# Patient Record
Sex: Female | Born: 1955 | Race: Black or African American | Hispanic: No | Smoking: Never smoker
Health system: Southern US, Community
[De-identification: ages and names within clinical notes are randomized; demographics above are authoritative.]

## PROBLEM LIST (undated history)

## (undated) DIAGNOSIS — B20 Human immunodeficiency virus [HIV] disease: Secondary | ICD-10-CM

## (undated) DIAGNOSIS — H269 Unspecified cataract: Secondary | ICD-10-CM

## (undated) DIAGNOSIS — Z21 Asymptomatic human immunodeficiency virus [HIV] infection status: Secondary | ICD-10-CM

## (undated) DIAGNOSIS — E039 Hypothyroidism, unspecified: Secondary | ICD-10-CM

## (undated) DIAGNOSIS — I10 Essential (primary) hypertension: Secondary | ICD-10-CM

## (undated) DIAGNOSIS — M1990 Unspecified osteoarthritis, unspecified site: Secondary | ICD-10-CM

## (undated) DIAGNOSIS — J329 Chronic sinusitis, unspecified: Secondary | ICD-10-CM

## (undated) DIAGNOSIS — R0600 Dyspnea, unspecified: Secondary | ICD-10-CM

## (undated) DIAGNOSIS — E079 Disorder of thyroid, unspecified: Secondary | ICD-10-CM

## (undated) DIAGNOSIS — F419 Anxiety disorder, unspecified: Secondary | ICD-10-CM

## (undated) DIAGNOSIS — J189 Pneumonia, unspecified organism: Secondary | ICD-10-CM

## (undated) HISTORY — DX: Unspecified cataract: H26.9

## (undated) HISTORY — DX: Unspecified osteoarthritis, unspecified site: M19.90

## (undated) HISTORY — PX: TOTAL HIP ARTHROPLASTY: SHX124

## (undated) HISTORY — PX: OTHER SURGICAL HISTORY: SHX169

## (undated) HISTORY — DX: Anxiety disorder, unspecified: F41.9

## (undated) HISTORY — DX: Asymptomatic human immunodeficiency virus (hiv) infection status: Z21

## (undated) HISTORY — DX: Human immunodeficiency virus (HIV) disease: B20

---

## 1999-03-03 ENCOUNTER — Other Ambulatory Visit: Admission: RE | Admit: 1999-03-03 | Discharge: 1999-03-03 | Payer: Self-pay | Admitting: *Deleted

## 1999-06-15 ENCOUNTER — Encounter: Payer: Self-pay | Admitting: Orthopedic Surgery

## 1999-06-22 ENCOUNTER — Inpatient Hospital Stay (HOSPITAL_COMMUNITY): Admission: RE | Admit: 1999-06-22 | Discharge: 1999-06-24 | Payer: Self-pay | Admitting: Orthopedic Surgery

## 1999-06-24 ENCOUNTER — Inpatient Hospital Stay (HOSPITAL_COMMUNITY)
Admission: RE | Admit: 1999-06-24 | Discharge: 1999-07-01 | Payer: Self-pay | Admitting: Physical Medicine & Rehabilitation

## 1999-08-08 ENCOUNTER — Encounter: Admission: RE | Admit: 1999-08-08 | Discharge: 1999-11-06 | Payer: Self-pay | Admitting: Orthopedic Surgery

## 2000-03-22 ENCOUNTER — Ambulatory Visit (HOSPITAL_COMMUNITY): Admission: RE | Admit: 2000-03-22 | Discharge: 2000-03-22 | Payer: Self-pay | Admitting: Orthopedic Surgery

## 2000-03-22 ENCOUNTER — Encounter: Payer: Self-pay | Admitting: Orthopedic Surgery

## 2000-04-04 ENCOUNTER — Ambulatory Visit (HOSPITAL_COMMUNITY): Admission: RE | Admit: 2000-04-04 | Discharge: 2000-04-04 | Payer: Self-pay | Admitting: Orthopedic Surgery

## 2000-04-04 ENCOUNTER — Encounter: Payer: Self-pay | Admitting: Orthopedic Surgery

## 2002-08-14 ENCOUNTER — Other Ambulatory Visit: Admission: RE | Admit: 2002-08-14 | Discharge: 2002-08-14 | Payer: Self-pay | Admitting: Nephrology

## 2003-03-17 ENCOUNTER — Encounter: Admission: RE | Admit: 2003-03-17 | Discharge: 2003-03-17 | Payer: Self-pay | Admitting: Nephrology

## 2003-07-02 ENCOUNTER — Other Ambulatory Visit: Admission: RE | Admit: 2003-07-02 | Discharge: 2003-07-02 | Payer: Self-pay | Admitting: Obstetrics and Gynecology

## 2005-08-14 ENCOUNTER — Emergency Department (HOSPITAL_COMMUNITY): Admission: EM | Admit: 2005-08-14 | Discharge: 2005-08-14 | Payer: Self-pay | Admitting: Family Medicine

## 2006-01-05 ENCOUNTER — Emergency Department (HOSPITAL_COMMUNITY): Admission: EM | Admit: 2006-01-05 | Discharge: 2006-01-06 | Payer: Self-pay | Admitting: Emergency Medicine

## 2006-09-28 ENCOUNTER — Ambulatory Visit: Payer: Self-pay | Admitting: Nurse Practitioner

## 2006-09-28 ENCOUNTER — Ambulatory Visit: Payer: Self-pay | Admitting: *Deleted

## 2006-09-28 DIAGNOSIS — B009 Herpesviral infection, unspecified: Secondary | ICD-10-CM | POA: Insufficient documentation

## 2006-09-28 DIAGNOSIS — E039 Hypothyroidism, unspecified: Secondary | ICD-10-CM | POA: Insufficient documentation

## 2006-09-28 LAB — CONVERTED CEMR LAB
Glucose, Urine, Semiquant: NEGATIVE
Ketones, urine, test strip: NEGATIVE
Nitrite: NEGATIVE
Specific Gravity, Urine: >=1.03
pH: 5.5

## 2006-10-02 ENCOUNTER — Encounter (INDEPENDENT_AMBULATORY_CARE_PROVIDER_SITE_OTHER): Payer: Self-pay | Admitting: Nurse Practitioner

## 2006-10-05 ENCOUNTER — Ambulatory Visit: Payer: Self-pay | Admitting: Nurse Practitioner

## 2006-10-05 DIAGNOSIS — M25579 Pain in unspecified ankle and joints of unspecified foot: Secondary | ICD-10-CM

## 2006-10-05 DIAGNOSIS — E669 Obesity, unspecified: Secondary | ICD-10-CM

## 2006-10-05 LAB — CONVERTED CEMR LAB
AST: 11 units/L (ref 0–37)
Albumin: 4 g/dL (ref 3.5–5.2)
Alkaline Phosphatase: 96 units/L (ref 39–117)
BUN: 16 mg/dL (ref 6–23)
Basophils Relative: 0 % (ref 0–1)
Calcium: 9.3 mg/dL (ref 8.4–10.5)
Chloride: 106 meq/L (ref 96–112)
Eosinophils Absolute: 0.1 10*3/uL (ref 0.0–0.7)
Glucose, Bld: 82 mg/dL (ref 70–99)
Lymphs Abs: 2.7 10*3/uL (ref 0.7–3.3)
MCV: 84.4 fL (ref 78.0–100.0)
Monocytes Relative: 7 % (ref 3–11)
Neutro Abs: 3.2 10*3/uL (ref 1.7–7.7)
Neutrophils Relative %: 49 % (ref 43–77)
Platelets: 470 10*3/uL — ABNORMAL HIGH (ref 150–400)
Potassium: 4.5 meq/L (ref 3.5–5.3)
RBC: 4.22 M/uL (ref 3.87–5.11)
Sodium: 139 meq/L (ref 135–145)
Total Protein: 7.5 g/dL (ref 6.0–8.3)
WBC: 6.5 10*3/uL (ref 4.0–10.5)

## 2006-10-08 ENCOUNTER — Encounter (INDEPENDENT_AMBULATORY_CARE_PROVIDER_SITE_OTHER): Payer: Self-pay | Admitting: Nurse Practitioner

## 2006-10-08 DIAGNOSIS — D649 Anemia, unspecified: Secondary | ICD-10-CM | POA: Insufficient documentation

## 2006-11-14 ENCOUNTER — Emergency Department (HOSPITAL_COMMUNITY): Admission: EM | Admit: 2006-11-14 | Discharge: 2006-11-15 | Payer: Self-pay | Admitting: Emergency Medicine

## 2006-11-14 LAB — CONVERTED CEMR LAB
BUN: 22 mg/dL
Basophils Absolute: 0 10*3/uL
Basophils Relative: 1 %
Chloride: 106 meq/L
Eosinophils Absolute: 0.2 10*3/uL
MCHC: 32.2 g/dL
MCV: 80 fL
Neutro Abs: 2.5 10*3/uL
Neutrophils Relative %: 36 %
Platelets: 360 10*3/uL
Potassium: 4 meq/L
RDW: 16 %
Sodium: 137 meq/L

## 2006-12-18 ENCOUNTER — Ambulatory Visit: Payer: Self-pay | Admitting: Nurse Practitioner

## 2006-12-18 DIAGNOSIS — K219 Gastro-esophageal reflux disease without esophagitis: Secondary | ICD-10-CM

## 2006-12-18 DIAGNOSIS — R0789 Other chest pain: Secondary | ICD-10-CM

## 2007-01-02 ENCOUNTER — Ambulatory Visit: Payer: Self-pay | Admitting: Cardiology

## 2007-01-10 ENCOUNTER — Ambulatory Visit: Payer: Self-pay

## 2007-01-10 ENCOUNTER — Encounter: Payer: Self-pay | Admitting: Cardiology

## 2007-02-24 ENCOUNTER — Emergency Department (HOSPITAL_COMMUNITY): Admission: EM | Admit: 2007-02-24 | Discharge: 2007-02-25 | Payer: Self-pay | Admitting: Emergency Medicine

## 2007-02-25 ENCOUNTER — Encounter (INDEPENDENT_AMBULATORY_CARE_PROVIDER_SITE_OTHER): Payer: Self-pay | Admitting: Nurse Practitioner

## 2007-02-27 ENCOUNTER — Encounter (INDEPENDENT_AMBULATORY_CARE_PROVIDER_SITE_OTHER): Payer: Self-pay | Admitting: Nurse Practitioner

## 2007-02-27 ENCOUNTER — Ambulatory Visit: Payer: Self-pay | Admitting: Family Medicine

## 2007-06-21 ENCOUNTER — Ambulatory Visit: Payer: Self-pay | Admitting: Nurse Practitioner

## 2007-06-21 DIAGNOSIS — R5381 Other malaise: Secondary | ICD-10-CM | POA: Insufficient documentation

## 2007-06-21 DIAGNOSIS — M25569 Pain in unspecified knee: Secondary | ICD-10-CM | POA: Insufficient documentation

## 2007-06-21 DIAGNOSIS — R5383 Other fatigue: Secondary | ICD-10-CM

## 2007-06-24 ENCOUNTER — Ambulatory Visit (HOSPITAL_COMMUNITY): Admission: RE | Admit: 2007-06-24 | Discharge: 2007-06-24 | Payer: Self-pay | Admitting: Nurse Practitioner

## 2007-06-25 LAB — CONVERTED CEMR LAB
AST: 15 units/L (ref 0–37)
BUN: 15 mg/dL (ref 6–23)
Basophils Relative: 0 % (ref 0–1)
Calcium: 9.1 mg/dL (ref 8.4–10.5)
Chloride: 105 meq/L (ref 96–112)
Creatinine, Ser: 0.86 mg/dL (ref 0.40–1.20)
Eosinophils Relative: 1 % (ref 0–5)
HCT: 34 % — ABNORMAL LOW (ref 36.0–46.0)
Hemoglobin: 10.5 g/dL — ABNORMAL LOW (ref 12.0–15.0)
MCHC: 30.9 g/dL (ref 30.0–36.0)
MCV: 81.3 fL (ref 78.0–100.0)
Monocytes Absolute: 0.6 10*3/uL (ref 0.1–1.0)
Monocytes Relative: 10 % (ref 3–12)
Neutro Abs: 2 10*3/uL (ref 1.7–7.7)
RBC: 4.18 M/uL (ref 3.87–5.11)
TSH: 8.293 microintl units/mL — ABNORMAL HIGH (ref 0.350–5.50)
Total Bilirubin: 0.2 mg/dL — ABNORMAL LOW (ref 0.3–1.2)

## 2007-08-02 ENCOUNTER — Telehealth (INDEPENDENT_AMBULATORY_CARE_PROVIDER_SITE_OTHER): Payer: Self-pay | Admitting: Nurse Practitioner

## 2007-08-13 ENCOUNTER — Emergency Department (HOSPITAL_COMMUNITY): Admission: EM | Admit: 2007-08-13 | Discharge: 2007-08-13 | Payer: Self-pay | Admitting: Emergency Medicine

## 2007-08-23 ENCOUNTER — Ambulatory Visit: Payer: Self-pay | Admitting: Internal Medicine

## 2007-08-23 ENCOUNTER — Ambulatory Visit (HOSPITAL_COMMUNITY): Admission: RE | Admit: 2007-08-23 | Discharge: 2007-08-23 | Payer: Self-pay | Admitting: Internal Medicine

## 2007-08-24 DIAGNOSIS — M169 Osteoarthritis of hip, unspecified: Secondary | ICD-10-CM

## 2007-08-26 ENCOUNTER — Telehealth (INDEPENDENT_AMBULATORY_CARE_PROVIDER_SITE_OTHER): Payer: Self-pay | Admitting: Nurse Practitioner

## 2007-08-27 ENCOUNTER — Emergency Department (HOSPITAL_COMMUNITY): Admission: EM | Admit: 2007-08-27 | Discharge: 2007-08-28 | Payer: Self-pay | Admitting: *Deleted

## 2007-08-29 ENCOUNTER — Ambulatory Visit: Payer: Self-pay | Admitting: Nurse Practitioner

## 2007-09-02 ENCOUNTER — Telehealth (INDEPENDENT_AMBULATORY_CARE_PROVIDER_SITE_OTHER): Payer: Self-pay | Admitting: Nurse Practitioner

## 2007-09-02 DIAGNOSIS — M8430XA Stress fracture, unspecified site, initial encounter for fracture: Secondary | ICD-10-CM

## 2007-09-05 ENCOUNTER — Encounter: Admission: RE | Admit: 2007-09-05 | Discharge: 2007-09-05 | Payer: Self-pay | Admitting: Internal Medicine

## 2007-09-06 ENCOUNTER — Telehealth (INDEPENDENT_AMBULATORY_CARE_PROVIDER_SITE_OTHER): Payer: Self-pay | Admitting: Nurse Practitioner

## 2007-09-16 ENCOUNTER — Telehealth (INDEPENDENT_AMBULATORY_CARE_PROVIDER_SITE_OTHER): Payer: Self-pay | Admitting: Nurse Practitioner

## 2007-10-02 ENCOUNTER — Telehealth (INDEPENDENT_AMBULATORY_CARE_PROVIDER_SITE_OTHER): Payer: Self-pay | Admitting: *Deleted

## 2008-01-15 ENCOUNTER — Telehealth (INDEPENDENT_AMBULATORY_CARE_PROVIDER_SITE_OTHER): Payer: Self-pay | Admitting: Nurse Practitioner

## 2008-01-22 ENCOUNTER — Ambulatory Visit: Payer: Self-pay | Admitting: Nurse Practitioner

## 2008-01-22 DIAGNOSIS — L259 Unspecified contact dermatitis, unspecified cause: Secondary | ICD-10-CM

## 2008-01-27 LAB — CONVERTED CEMR LAB
Albumin: 4 g/dL (ref 3.5–5.2)
BUN: 13 mg/dL (ref 6–23)
CO2: 23 meq/L (ref 19–32)
Calcium: 9.7 mg/dL (ref 8.4–10.5)
Chloride: 102 meq/L (ref 96–112)
Glucose, Bld: 87 mg/dL (ref 70–99)
HDL: 51 mg/dL (ref >39)
Lymphs Abs: 4.1 10*3/uL — ABNORMAL HIGH (ref 0.7–4.0)
MCV: 80.6 fL (ref 78.0–100.0)
Monocytes Relative: 8 % (ref 3–12)
Neutro Abs: 2.9 10*3/uL (ref 1.7–7.7)
Neutrophils Relative %: 37 % — ABNORMAL LOW (ref 43–77)
Potassium: 4.3 meq/L (ref 3.5–5.3)
RBC: 4.44 M/uL (ref 3.87–5.11)
Triglycerides: 94 mg/dL (ref <150)
WBC: 7.8 10*3/uL (ref 4.0–10.5)

## 2008-04-27 ENCOUNTER — Telehealth (INDEPENDENT_AMBULATORY_CARE_PROVIDER_SITE_OTHER): Payer: Self-pay | Admitting: Nurse Practitioner

## 2008-07-01 ENCOUNTER — Ambulatory Visit: Payer: Self-pay | Admitting: Nurse Practitioner

## 2008-07-01 DIAGNOSIS — K029 Dental caries, unspecified: Secondary | ICD-10-CM | POA: Insufficient documentation

## 2008-09-16 ENCOUNTER — Telehealth (INDEPENDENT_AMBULATORY_CARE_PROVIDER_SITE_OTHER): Payer: Self-pay | Admitting: Nurse Practitioner

## 2008-09-16 ENCOUNTER — Ambulatory Visit: Payer: Self-pay | Admitting: Nurse Practitioner

## 2008-09-16 DIAGNOSIS — R030 Elevated blood-pressure reading, without diagnosis of hypertension: Secondary | ICD-10-CM | POA: Insufficient documentation

## 2008-10-29 ENCOUNTER — Telehealth (INDEPENDENT_AMBULATORY_CARE_PROVIDER_SITE_OTHER): Payer: Self-pay | Admitting: Nurse Practitioner

## 2008-11-19 ENCOUNTER — Ambulatory Visit: Payer: Self-pay | Admitting: Nurse Practitioner

## 2008-11-19 DIAGNOSIS — I493 Ventricular premature depolarization: Secondary | ICD-10-CM | POA: Insufficient documentation

## 2008-11-19 LAB — CONVERTED CEMR LAB
Glucose, Urine, Semiquant: NEGATIVE
Nitrite: NEGATIVE
OCCULT 1: NEGATIVE
Protein, U semiquant: NEGATIVE
WBC Urine, dipstick: NEGATIVE
pH: 5

## 2008-11-26 LAB — CONVERTED CEMR LAB: OCCULT 1: NEGATIVE

## 2008-12-01 ENCOUNTER — Encounter (INDEPENDENT_AMBULATORY_CARE_PROVIDER_SITE_OTHER): Payer: Self-pay | Admitting: Nurse Practitioner

## 2008-12-01 DIAGNOSIS — B20 Human immunodeficiency virus [HIV] disease: Secondary | ICD-10-CM

## 2008-12-01 LAB — CONVERTED CEMR LAB
ALT: 13 units/L (ref 0–35)
AST: 14 units/L (ref 0–37)
Alkaline Phosphatase: 90 units/L (ref 39–117)
Basophils Absolute: 0 10*3/uL (ref 0.0–0.1)
Basophils Relative: 0 % (ref 0–1)
Chloride: 104 meq/L (ref 96–112)
Creatinine, Ser: 0.99 mg/dL (ref 0.40–1.20)
Eosinophils Absolute: 0.2 10*3/uL (ref 0.0–0.7)
HIV 1 RNA Quant: 1160 copies/mL — ABNORMAL HIGH (ref <48)
HIV-1 RNA Quant, Log: 3.06 — ABNORMAL HIGH (ref <1.68)
HIV-2 Ab: UNDETERMINED — AB
Lymphs Abs: 3.5 10*3/uL (ref 0.7–4.0)
MCV: 82.7 fL (ref 78.0–100.0)
Neutrophils Relative %: 37 % — ABNORMAL LOW (ref 43–77)
Platelets: 393 10*3/uL (ref 150–400)
RDW: 16.2 % — ABNORMAL HIGH (ref 11.5–15.5)
Total Bilirubin: 0.2 mg/dL — ABNORMAL LOW (ref 0.3–1.2)
Total lymphocyte count: 2989 cells/mcL (ref 700–3300)
WBC: 7.2 10*3/uL (ref 4.0–10.5)

## 2008-12-02 ENCOUNTER — Encounter (INDEPENDENT_AMBULATORY_CARE_PROVIDER_SITE_OTHER): Payer: Self-pay | Admitting: Nurse Practitioner

## 2008-12-03 ENCOUNTER — Encounter (INDEPENDENT_AMBULATORY_CARE_PROVIDER_SITE_OTHER): Payer: Self-pay | Admitting: Nurse Practitioner

## 2008-12-08 ENCOUNTER — Ambulatory Visit: Payer: Self-pay | Admitting: Internal Medicine

## 2008-12-08 ENCOUNTER — Encounter (INDEPENDENT_AMBULATORY_CARE_PROVIDER_SITE_OTHER): Payer: Self-pay | Admitting: *Deleted

## 2008-12-09 ENCOUNTER — Ambulatory Visit (HOSPITAL_COMMUNITY): Admission: RE | Admit: 2008-12-09 | Discharge: 2008-12-09 | Payer: Self-pay | Admitting: Internal Medicine

## 2008-12-10 ENCOUNTER — Encounter: Payer: Self-pay | Admitting: Internal Medicine

## 2008-12-10 ENCOUNTER — Ambulatory Visit: Payer: Self-pay | Admitting: Internal Medicine

## 2009-01-13 ENCOUNTER — Telehealth (INDEPENDENT_AMBULATORY_CARE_PROVIDER_SITE_OTHER): Payer: Self-pay | Admitting: Nurse Practitioner

## 2009-01-19 ENCOUNTER — Encounter: Payer: Self-pay | Admitting: Internal Medicine

## 2009-01-25 ENCOUNTER — Encounter: Payer: Self-pay | Admitting: Internal Medicine

## 2009-01-25 ENCOUNTER — Ambulatory Visit: Payer: Self-pay | Admitting: Internal Medicine

## 2009-03-04 ENCOUNTER — Telehealth (INDEPENDENT_AMBULATORY_CARE_PROVIDER_SITE_OTHER): Payer: Self-pay | Admitting: Nurse Practitioner

## 2009-03-04 ENCOUNTER — Telehealth: Payer: Self-pay | Admitting: Internal Medicine

## 2009-03-11 ENCOUNTER — Encounter (INDEPENDENT_AMBULATORY_CARE_PROVIDER_SITE_OTHER): Payer: Self-pay | Admitting: *Deleted

## 2009-03-18 ENCOUNTER — Ambulatory Visit: Payer: Self-pay | Admitting: Family Medicine

## 2009-03-18 ENCOUNTER — Encounter: Payer: Self-pay | Admitting: Internal Medicine

## 2009-03-18 ENCOUNTER — Encounter (INDEPENDENT_AMBULATORY_CARE_PROVIDER_SITE_OTHER): Payer: Self-pay | Admitting: Nurse Practitioner

## 2009-03-18 LAB — CONVERTED CEMR LAB
ALT: 15 units/L (ref 0–35)
BUN: 20 mg/dL (ref 6–23)
Basophils Relative: 0 % (ref 0–1)
CD4 T Helper %: 32 % (ref 32–62)
CO2: 26 meq/L (ref 19–32)
Calcium: 9 mg/dL (ref 8.4–10.5)
Creatinine, Ser: 1 mg/dL (ref 0.40–1.20)
Eosinophils Absolute: 0.1 10*3/uL (ref 0.0–0.7)
Eosinophils Relative: 3 % (ref 0–5)
HCT: 33.7 % — ABNORMAL LOW (ref 36.0–46.0)
HCV Ab: NEGATIVE
HIV 1 RNA Quant: 1470 copies/mL — ABNORMAL HIGH (ref <48)
Hepatitis B Surface Ag: NEGATIVE
MCHC: 30.6 g/dL (ref 30.0–36.0)
MCV: 84.3 fL (ref 78.0–100.0)
Monocytes Relative: 8 % (ref 3–12)
Neutrophils Relative %: 38 % — ABNORMAL LOW (ref 43–77)
Platelets: 415 10*3/uL — ABNORMAL HIGH (ref 150–400)
Total Bilirubin: 0.3 mg/dL (ref 0.3–1.2)

## 2009-03-23 ENCOUNTER — Emergency Department (HOSPITAL_COMMUNITY): Admission: EM | Admit: 2009-03-23 | Discharge: 2009-03-23 | Payer: Self-pay | Admitting: Emergency Medicine

## 2009-03-25 ENCOUNTER — Encounter: Admission: RE | Admit: 2009-03-25 | Discharge: 2009-03-25 | Payer: Self-pay | Admitting: Orthopedic Surgery

## 2009-03-25 ENCOUNTER — Encounter: Payer: Self-pay | Admitting: Internal Medicine

## 2009-03-25 ENCOUNTER — Ambulatory Visit: Payer: Self-pay | Admitting: Internal Medicine

## 2009-04-29 ENCOUNTER — Encounter: Payer: Self-pay | Admitting: Internal Medicine

## 2009-05-19 ENCOUNTER — Ambulatory Visit: Payer: Self-pay | Admitting: Internal Medicine

## 2009-05-21 ENCOUNTER — Encounter: Payer: Self-pay | Admitting: Internal Medicine

## 2009-05-21 LAB — CONVERTED CEMR LAB: TSH: 3.621 microintl units/mL (ref 0.350–4.500)

## 2009-05-28 ENCOUNTER — Telehealth: Payer: Self-pay | Admitting: Internal Medicine

## 2009-06-02 HISTORY — PX: JOINT REPLACEMENT: SHX530

## 2009-06-21 ENCOUNTER — Encounter: Payer: Self-pay | Admitting: Internal Medicine

## 2009-06-23 ENCOUNTER — Encounter
Admission: RE | Admit: 2009-06-23 | Discharge: 2009-09-20 | Payer: Self-pay | Admitting: Physical Medicine and Rehabilitation

## 2009-06-24 ENCOUNTER — Encounter (INDEPENDENT_AMBULATORY_CARE_PROVIDER_SITE_OTHER): Payer: Self-pay | Admitting: Nurse Practitioner

## 2009-06-24 ENCOUNTER — Ambulatory Visit: Payer: Self-pay | Admitting: Internal Medicine

## 2009-06-24 ENCOUNTER — Encounter (INDEPENDENT_AMBULATORY_CARE_PROVIDER_SITE_OTHER): Payer: Self-pay | Admitting: *Deleted

## 2009-06-24 LAB — CONVERTED CEMR LAB
AST: 17 units/L (ref 0–37)
Albumin: 3.6 g/dL (ref 3.5–5.2)
BUN: 14 mg/dL (ref 6–23)
CO2: 24 meq/L (ref 19–32)
Calcium: 9.2 mg/dL (ref 8.4–10.5)
Chloride: 105 meq/L (ref 96–112)
Cholesterol: 162 mg/dL (ref 0–200)
Creatinine, Ser: 0.97 mg/dL (ref 0.40–1.20)
Eosinophils Absolute: 0.2 10*3/uL (ref 0.0–0.7)
Eosinophils Relative: 4 % (ref 0–5)
Glucose, Bld: 100 mg/dL — ABNORMAL HIGH (ref 70–99)
HCT: 33.1 % — ABNORMAL LOW (ref 36.0–46.0)
HDL: 31 mg/dL — ABNORMAL LOW (ref >39)
HIV 1 RNA Quant: 491 copies/mL — ABNORMAL HIGH (ref <48)
Hemoglobin: 10.3 g/dL — ABNORMAL LOW (ref 12.0–15.0)
Lymphocytes Relative: 55 % — ABNORMAL HIGH (ref 12–46)
Lymphs Abs: 2.7 10*3/uL (ref 0.7–4.0)
MCV: 82.8 fL (ref 78.0–100.0)
Monocytes Absolute: 0.6 10*3/uL (ref 0.1–1.0)
Monocytes Relative: 12 % (ref 3–12)
Platelets: 406 10*3/uL — ABNORMAL HIGH (ref 150–400)
Potassium: 3.9 meq/L (ref 3.5–5.3)
RBC: 4 M/uL (ref 3.87–5.11)
Total CHOL/HDL Ratio: 5.2
WBC: 4.9 10*3/uL (ref 4.0–10.5)

## 2009-06-29 ENCOUNTER — Encounter: Payer: Self-pay | Admitting: Internal Medicine

## 2009-06-30 ENCOUNTER — Telehealth: Payer: Self-pay | Admitting: Internal Medicine

## 2009-07-01 ENCOUNTER — Encounter: Payer: Self-pay | Admitting: Internal Medicine

## 2009-07-12 ENCOUNTER — Ambulatory Visit: Payer: Self-pay | Admitting: Internal Medicine

## 2009-07-19 ENCOUNTER — Ambulatory Visit: Payer: Self-pay | Admitting: Physical Medicine and Rehabilitation

## 2009-08-09 ENCOUNTER — Encounter: Payer: Self-pay | Admitting: Internal Medicine

## 2009-08-09 IMAGING — CR DG FEMUR 2+V*R*
3 series · 3 of 3 positions shown · non-contrast
Comparison: Knee radiographs 03/17/2003.

Right femur 2 views, 02/25/2007.
INDICATION: Right knee/hip pain.

[t femur with knee ap right (1 of 2)]
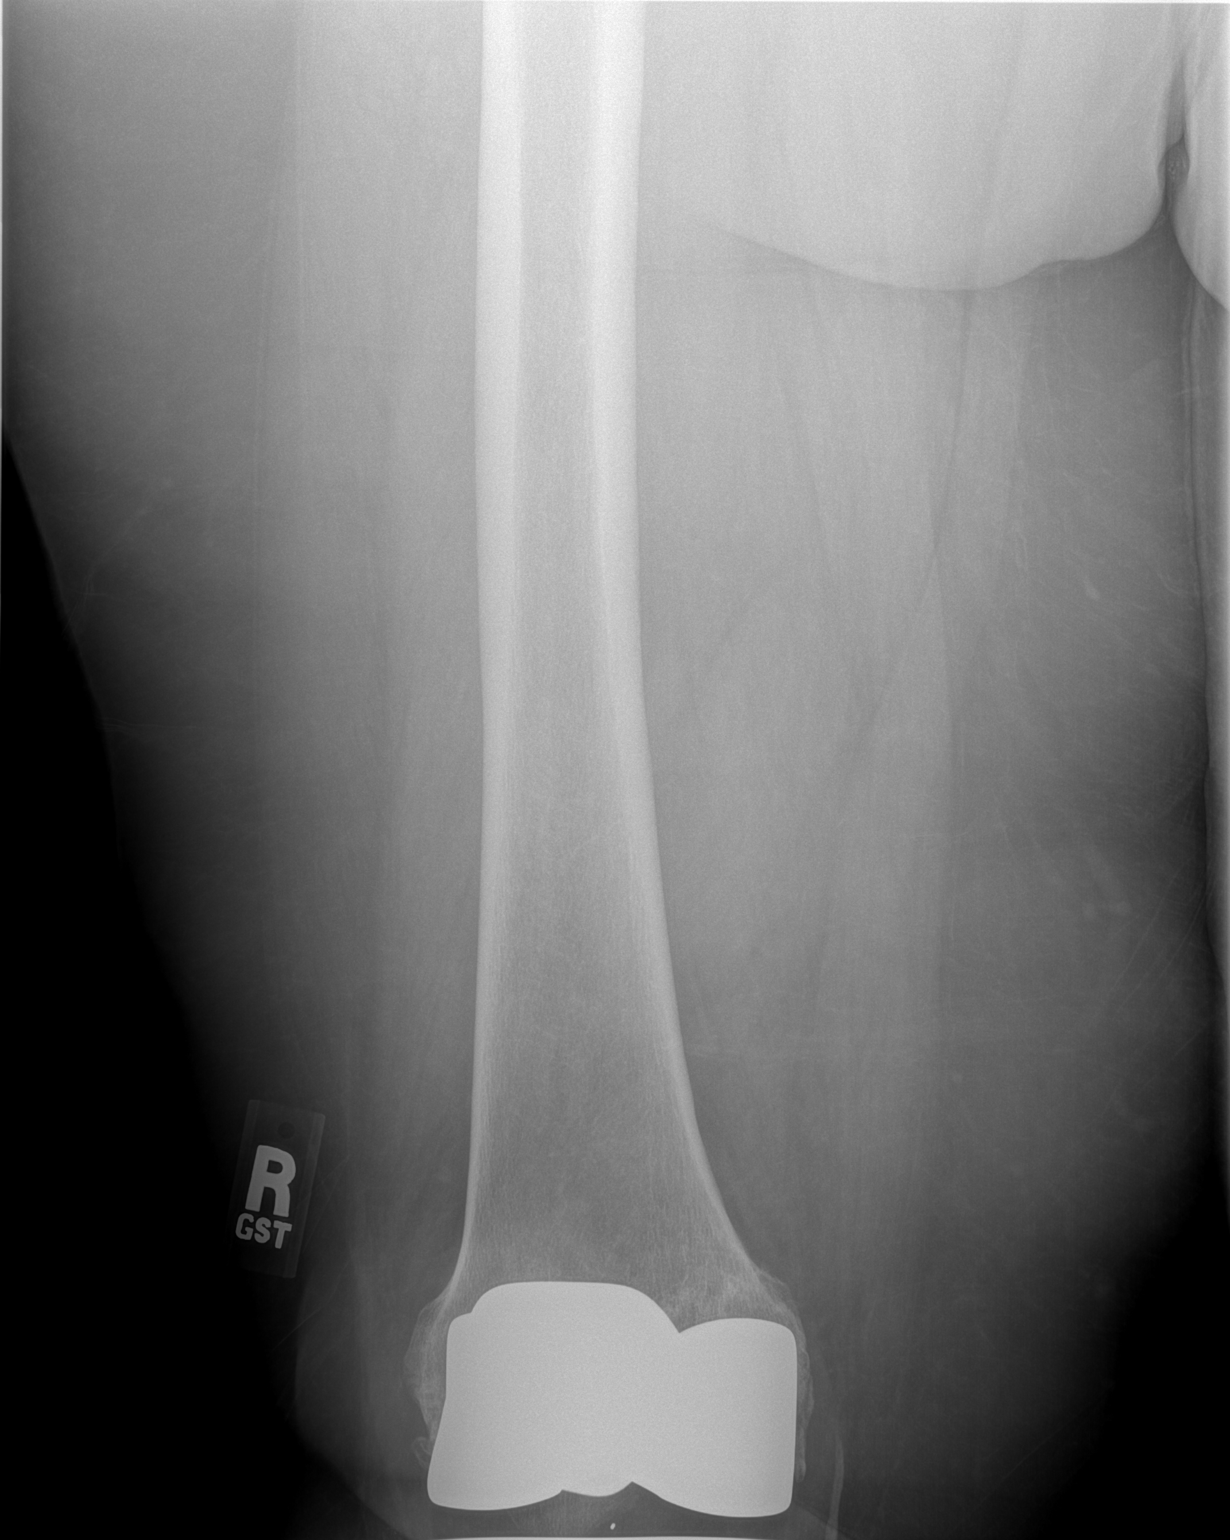

[t femur with knee ap right (2 of 2)]
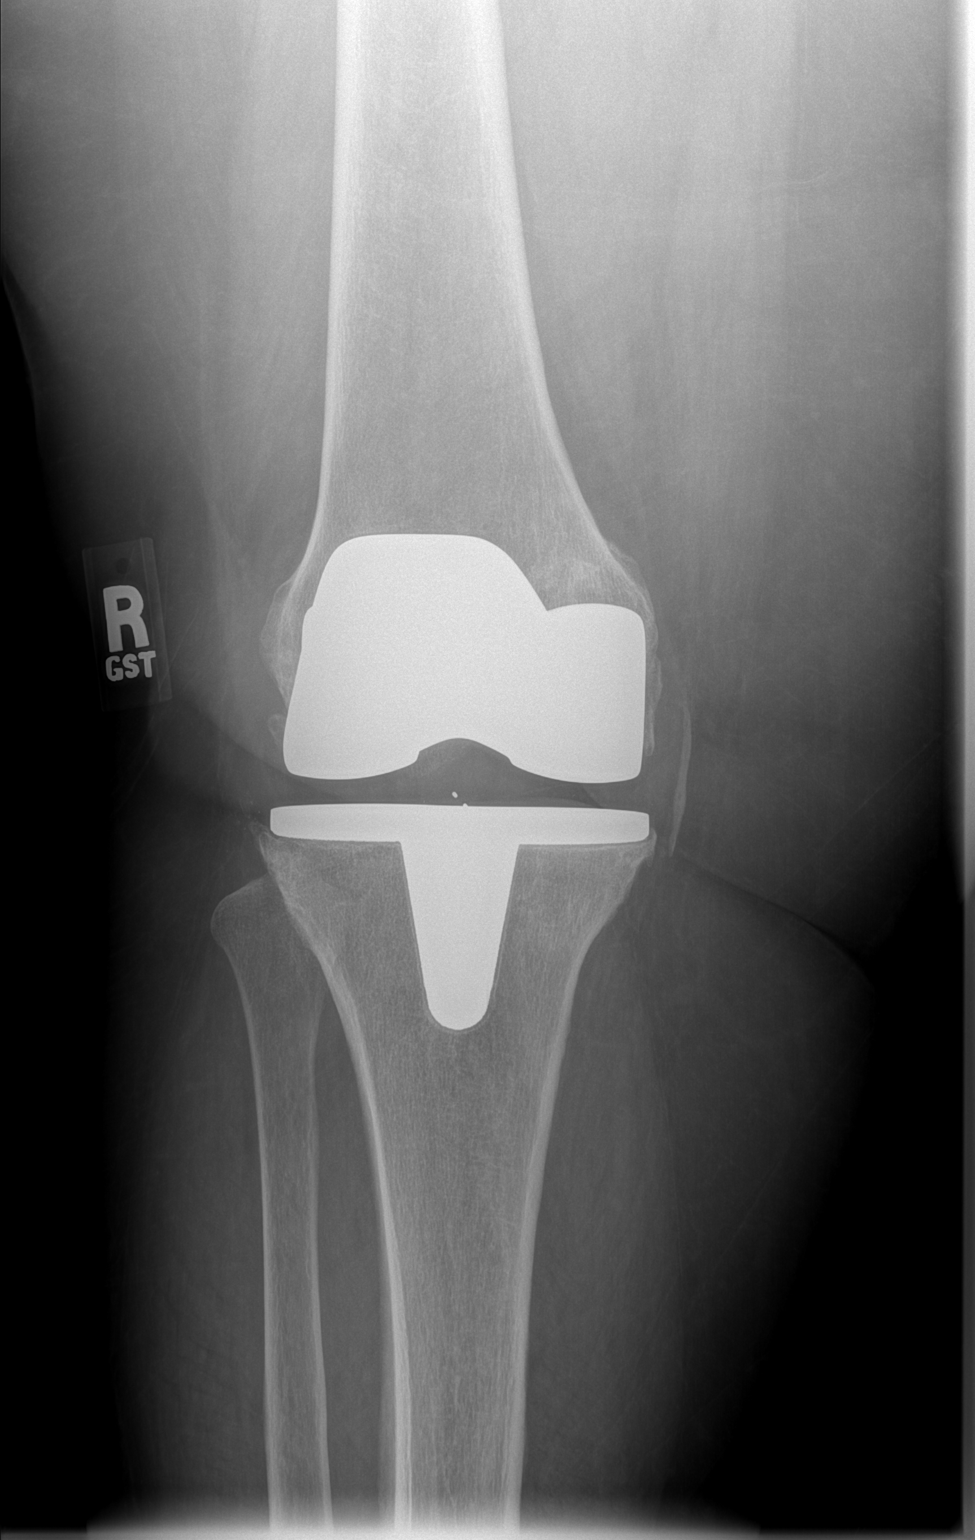

[t femur with knee lat right]
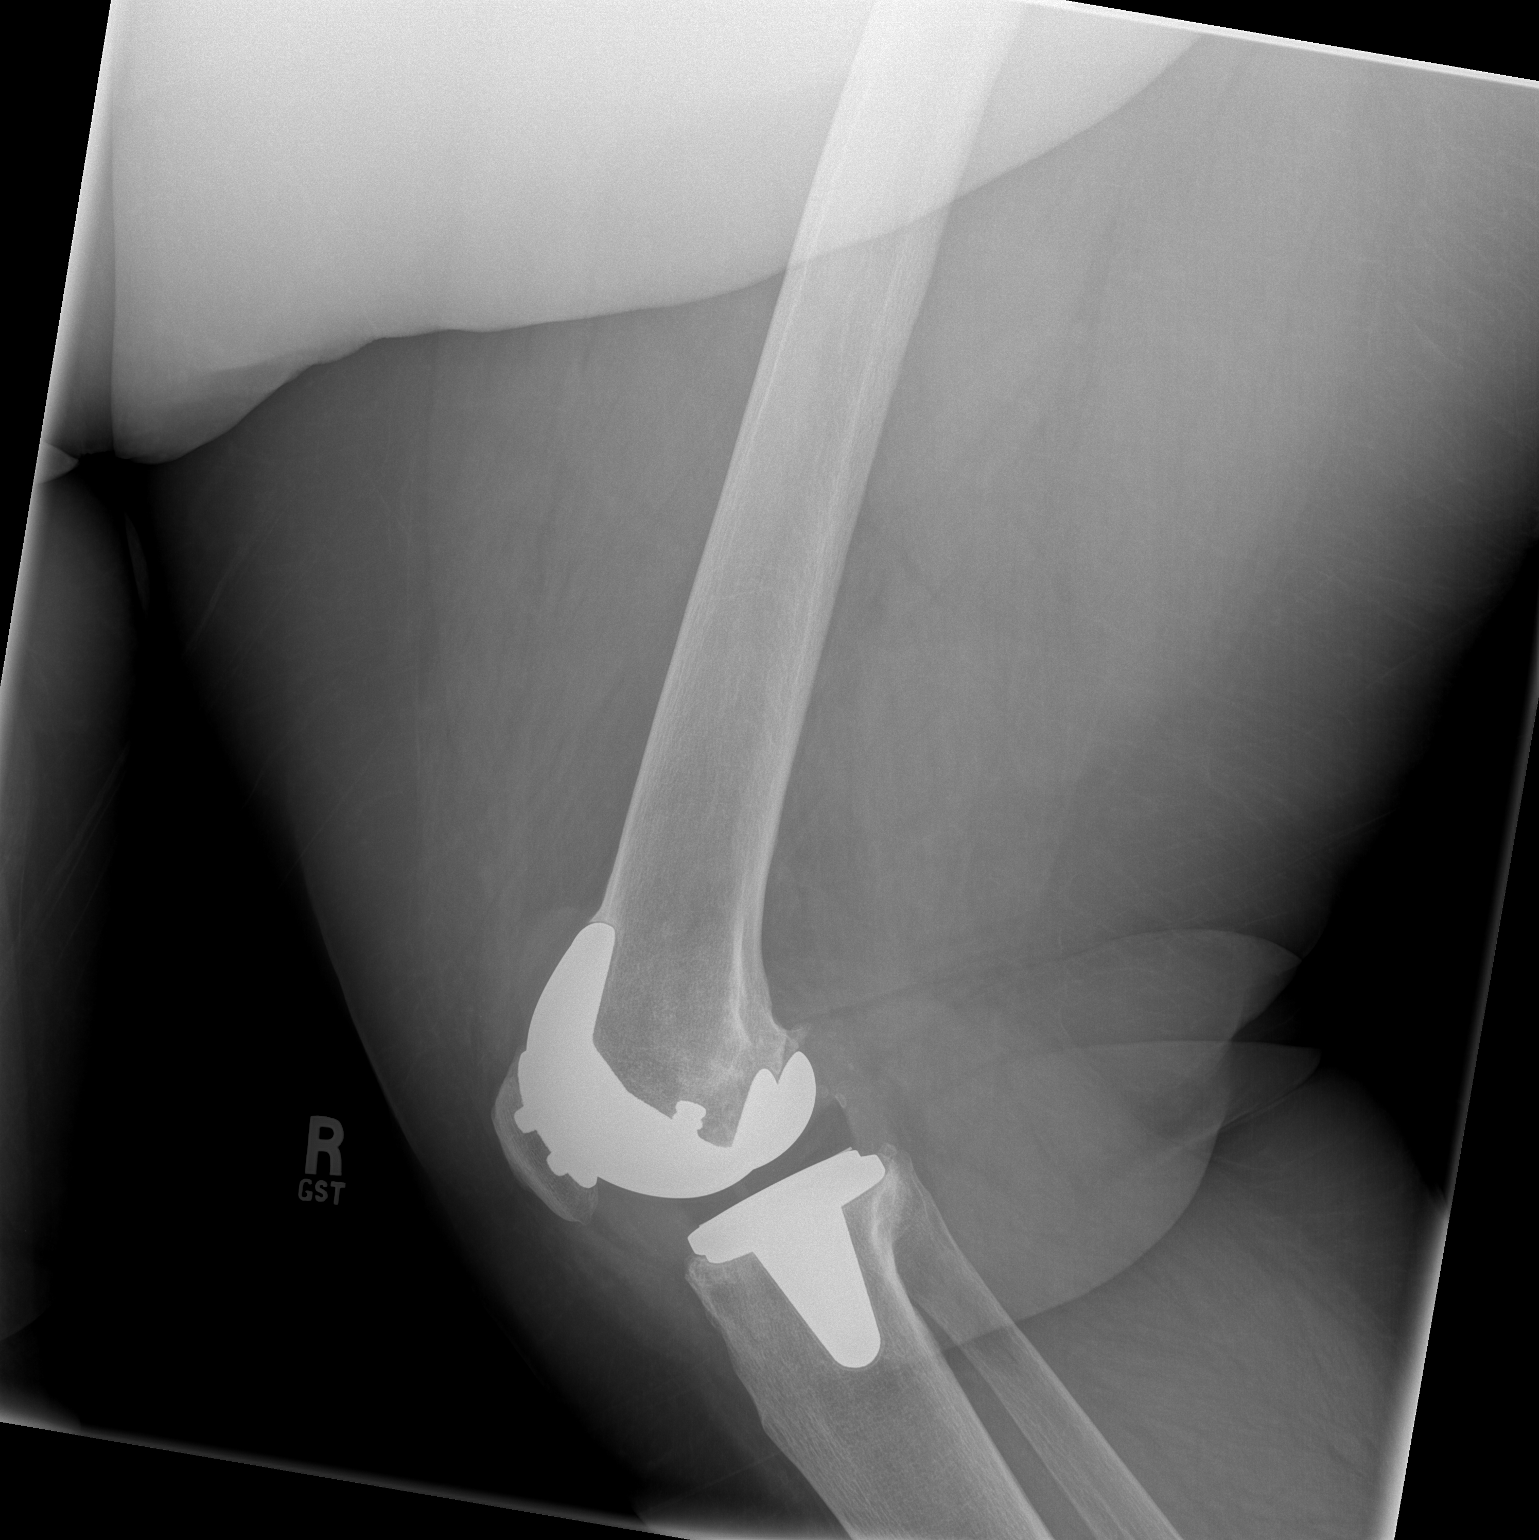

[3 of 3 positions shown; findings below may reference images not displayed]

FINDINGS: Right three-part total knee arthroplasty is present without
complicating features. Polyethylene spacer appears intact. No evidence of
loosening. Dystrophic calcification along the medial collateral ligament.
IMPRESSION: Intact distal right femur with uncomplicated three-part right total
knee arthroplasty.

## 2009-08-13 ENCOUNTER — Ambulatory Visit: Payer: Self-pay | Admitting: Physical Medicine and Rehabilitation

## 2009-08-16 ENCOUNTER — Encounter: Admission: RE | Admit: 2009-08-16 | Discharge: 2009-09-24 | Payer: Self-pay | Admitting: Orthopedic Surgery

## 2009-09-20 ENCOUNTER — Telehealth (INDEPENDENT_AMBULATORY_CARE_PROVIDER_SITE_OTHER): Payer: Self-pay | Admitting: *Deleted

## 2009-09-20 ENCOUNTER — Encounter
Admission: RE | Admit: 2009-09-20 | Discharge: 2009-12-13 | Payer: Self-pay | Source: Home / Self Care | Attending: Physical Medicine and Rehabilitation | Admitting: Physical Medicine and Rehabilitation

## 2009-09-24 ENCOUNTER — Ambulatory Visit: Payer: Self-pay | Admitting: Physical Medicine and Rehabilitation

## 2009-10-07 ENCOUNTER — Ambulatory Visit (HOSPITAL_COMMUNITY)
Admission: RE | Admit: 2009-10-07 | Discharge: 2009-10-07 | Payer: Self-pay | Admitting: Physical Medicine and Rehabilitation

## 2009-11-17 ENCOUNTER — Ambulatory Visit: Payer: Self-pay | Admitting: Physical Medicine and Rehabilitation

## 2009-12-13 ENCOUNTER — Ambulatory Visit: Payer: Self-pay | Admitting: Physical Medicine and Rehabilitation

## 2010-01-17 ENCOUNTER — Encounter
Admission: RE | Admit: 2010-01-17 | Discharge: 2010-02-01 | Payer: Self-pay | Source: Home / Self Care | Attending: Physical Medicine and Rehabilitation | Admitting: Physical Medicine and Rehabilitation

## 2010-01-18 ENCOUNTER — Ambulatory Visit
Admission: RE | Admit: 2010-01-18 | Discharge: 2010-01-18 | Payer: Self-pay | Source: Home / Self Care | Attending: Physical Medicine and Rehabilitation | Admitting: Physical Medicine and Rehabilitation

## 2010-01-23 ENCOUNTER — Encounter: Payer: Self-pay | Admitting: Obstetrics and Gynecology

## 2010-01-24 ENCOUNTER — Encounter: Payer: Self-pay | Admitting: Internal Medicine

## 2010-02-01 NOTE — Letter (Signed)
Summary: [facility]//COMMUNICABLE DISEASE REPORT  [facility]//COMMUNICABLE DISEASE REPORT   Imported By: Arta Bruce 01/21/2009 15:37:11  _____________________________________________________________________  External Attachment:    Type:   Image     Comment:   External Document

## 2010-02-01 NOTE — Consult Note (Signed)
Summary: [facility]  [facility]   Imported By: Florinda Marker 08/09/2009 14:37:09  _____________________________________________________________________  External Attachment:    Type:   Image     Comment:   External Document

## 2010-02-01 NOTE — Letter (Signed)
Summary: CONFIDENTIAL CASE REPORT  CONFIDENTIAL CASE REPORT   Imported By: Arta Bruce 01/25/2009 09:20:59  _____________________________________________________________________  External Attachment:    Type:   Image     Comment:   External Document

## 2010-02-01 NOTE — Miscellaneous (Signed)
Summary: Medication  Contract  Medication  Contract   Imported By: Florinda Marker 07/02/2009 11:24:37  _____________________________________________________________________  External Attachment:    Type:   Image     Comment:   External Document

## 2010-02-01 NOTE — Progress Notes (Signed)
Summary: refill request  Phone Note Refill Request Message from:  Patient on March 04, 2009 11:38 AM  Refills Requested: Medication #1:  PERCOCET 7.5-500 MG TABS Take 1 tablet by mouth every 8 hours as needed.   Supply Requested: 1 month Initial call taken by: Michelle Nasuti,  March 04, 2009 11:38 AM  Follow-up for Phone Call        ok x 1 - printed Follow-up by: Yisroel Ramming MD,  March 04, 2009 11:44 AM    Prescriptions: PERCOCET 7.5-500 MG TABS (OXYCODONE-ACETAMINOPHEN) Take 1 tablet by mouth every 8 hours as needed  #90 x 0   Entered and Authorized by:   Yisroel Ramming MD   Signed by:   Yisroel Ramming MD on 03/04/2009   Method used:   Print then Give to Patient   RxID:   [id number]   Appended Document: refill request rz left in front office left detailed msg stating Rx was ready for pick up

## 2010-02-01 NOTE — Consult Note (Signed)
Summary: [facility]  [facility]   Imported By: Florinda Marker 06/21/2009 14:25:21  _____________________________________________________________________  External Attachment:    Type:   Image     Comment:   External Document

## 2010-02-01 NOTE — Progress Notes (Signed)
Summary: Office Visit/DEPRESSION SCREENING  Office Visit/DEPRESSION SCREENING   Imported By: Arta Bruce 01/05/2009 14:05:32  _____________________________________________________________________  External Attachment:    Type:   Image     Comment:   External Document

## 2010-02-01 NOTE — Miscellaneous (Signed)
Summary: Office Visit ([facility])    Vital Signs:  Patient profile:   55 year old female Menstrual status:  postmenopausal Height:      65 inches (165.10 cm) Weight:      323.6 pounds (147.09 kg) Temp:     98.1 degrees F oral Pulse rate:   77 / minute Pulse rhythm:   regular Resp:     20 per minute BP sitting:   119 / 75  (left arm)  Vitals Entered By: Michelle Nasuti (January 25, 2009 10:56 AM) CC: pt is requesting meds for pain...pain in R hip and L ankle Is Patient Diabetic? No Pain Assessment Patient in pain? yes      Intensity: 10 Onset of pain  Chronic  Does patient need assistance? Functional Status Self care Ambulation Normal   CC:  pt is requesting meds for pain...pain in R hip and L ankle.  History of Present Illness: Pt states that she needs something stronger for her hip and ankle pain.  The vicodin is not helping. She did apply for Medicaid so she can be seen by orthopedics.  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 1 Tablet By Mouth Daily For Thyroid 2)  Prevacid Solutab 30 Mg  Tbdp (Lansoprazole) .[name].. 1 Tablet By Mouth Daily 3)  Ferrous Sulfate 325 (65  Fe) Mg  Tbec (Ferrous Sulfate) .[name].. 1 Tablet By Mouth Daily 4)  Allegra 180 Mg Tabs (Fexofenadine Hcl) .[name].. 1 Tablet By Mouth Daily 5)  Triamcinolone Acetonide 0.1 % Lotn (Triamcinolone Acetonide) .... Apply Topically To Affected Area Two Times A Day 6)  Percocet 7.5-500 Mg Tabs (Oxycodone-Acetaminophen) .... Take 1 Tablet By Mouth Every 8 Hours As Needed  Allergies: No Known Drug Allergies   Review of Systems  The patient denies anorexia, fever, and weight loss.     Physical Exam  General:  alert, well-hydrated, and overweight-appearing.   Head:  normocephalic and atraumatic.   Msk:  left ankle swollen   Impression & Recommendations:  Problem # 1:  OSTEOARTHRITIS, HIP, RIGHT (ICD-715.95) will treat with percocet needs referral to ortho for definative treatment - pending payor source The following medications were removed from the medication list:    Vicodin 5-500 Mg Tabs (Hydrocodone-acetaminophen) .[name]... Take 1 tablet by mouth every 8 hours as needed Her updated medication list for this problem includes:    Percocet 7.5-500 Mg Tabs (Oxycodone-acetaminophen) .[name]... Take 1 tablet by mouth every 8 hours as needed  Problem # 2:  HIV INFECTION (ICD-042) currently not on treatment f/u as scheduled Orders: Est. Patient Level III (09811)  Medications Added to Medication List This Visit: 1)  Percocet 7.5-500 Mg Tabs (Oxycodone-acetaminophen) .... Take 1 tablet by mouth every 8 hours  as needed  Prescriptions: PERCOCET 7.5-500 MG TABS (OXYCODONE-ACETAMINOPHEN) Take 1 tablet by mouth every 8 hours as needed  #90 x 0   Entered and Authorized by:   Yisroel Ramming MD   Signed by:   Yisroel Ramming MD on 01/25/2009   Method used:   Print then Give to Patient   RxID:   [id number]

## 2010-02-01 NOTE — Letter (Signed)
Summary: *HSN Results Follow up  [facility]  [address] [place], [place] [address]   Phone: [phone]  Fax: [phone]      03/11/2009   [name] [address] [place], [place]  [address]   Dear  [name],                            ____S.Drinkard,FNP   ____D. Gore,FNP       ____B. McPherson,MD   ____V. Rankins,MD    ____E. Mulberry,MD    _X___N. Daphine Deutscher, FNP  ____D. Reche Dixon, MD    ____K. Philipp Deputy, MD    ____Other     This letter is to inform you that your recent test(s):  _______Pap Smear    _______Lab Test     _______X-ray    _______ is within acceptable limits  _______ requires a medication change  _______ requires a follow-up lab visit  _______ requires a follow-up visit with your provider   Comments:  We have tried contacting you at [phone].  All of your medical needs should be addressed by Dr. Philipp Deputy.  If you have any questions please contact the office at your earlies convenience.       _________________________________________________________ If you have any questions, please contact our office                     Sincerely,  Levon Hedger [facility]

## 2010-02-01 NOTE — Progress Notes (Signed)
Summary: PAIN MEDS NOT WORKING  Phone Note Call from Patient Call back at [phone]   Reason for Call: Refill Medication Summary of Call: Miranda Kelley PT. MS Kelley IS CALLING TO SEE IF SHE CAN GET A REFILL ON THE PERCOCET OR THE OXYCONTIN THAT YOU PRESCRIBED SOME TIME AGO FOR HER . SHE SAYS THE VICODEN IS NOT HELPING HER AT ALL. SHE IS HAVING TO WALK ON CRUTCHES NOW. SHE USES [facility] ON [address]  Initial call taken by: Leodis Rains,  January 13, 2009 2:48 PM  Follow-up for Phone Call        The pt needs more medical refills from percocet and oxycotin.[name]  January 15, 2009 3:27 PM  Additional Follow-up for Phone Call Additional follow up Details #1::        will forward to provider for review...Miranda KitchenMarland KitchenMarland Kitchen Additional Follow-up by: Mikey College CMA,  January 18, 2009 11:54 AM    Additional Follow-up for Phone Call Additional follow up Details #2::    Pt is now being seen by Dr. Drue Second.  She has already established with her. will need to see if she is willing to prescribe pain meds.  Follow-up by: Lehman Prom FNP,  January 18, 2009 12:18 PM  Additional Follow-up for Phone Call Additional follow up Details #3:: Details for Additional Follow-up Action Taken: pt informed about above information. Additional Follow-up by: Levon Hedger,  January 19, 2009 2:39 PM   Appended Document: PAIN MEDS NOT WORKING will need to make an appt to discuss I have only met with her one time  Appended Document: PAIN MEDS NOT WORKING appt. scheduled for Monday 01/25/09 [name] RN

## 2010-02-01 NOTE — Miscellaneous (Signed)
Summary: Preload-Problems-Medications-Allergies  Clinical Lists Changes  Problems: Added new problem of PREVENTIVE HEALTH CARE (ICD-V70.0)

## 2010-02-01 NOTE — Miscellaneous (Signed)
Summary: Orders Update  Clinical Lists Changes  Orders: Added new Test order of T-CBC w/Diff [phone]) - Signed Added new Test order of T-CD4SP [facility]) (CD4SP) - Signed Added new Test order of T-Comprehensive Metabolic Panel [phone]) - Signed Added new Test order of T-HIV Viral Load [phone]) - Signed Added new Test order of T-Lipid Profile ([id number]) - Signed     Process Orders Check Orders Results:     [facility]: ABN not required for this insurance Tests Sent for requisitioning (June 24, 2009 12:19 PM):     06/24/2009: [facility] -- T-CBC w/Diff [[id number]] (signed)     06/24/2009: [facility] -- T-Comprehensive Metabolic Panel [[id number]] (signed)     06/24/2009: [facility] -- T-HIV Viral Load [phone] (signed)     06/24/2009: [facility] -- T-Lipid Profile [phone] (signed)

## 2010-02-01 NOTE — Assessment & Plan Note (Signed)
Summary: left ankle pain   CC:  pt. c/o left ankle pain.  History of Present Illness: Pt c/o left ankle pain.  No recent injury. Has tendon damage in that ankle in the past.  It is always a little swollen.  Preventive Screening-Counseling & Management  Alcohol-Tobacco     Alcohol drinks/day: occasionally     Smoking Status: never     Passive Smoke Exposure: Yes  Caffeine-Diet-Exercise     Caffeine use/day: 2     Does Patient Exercise: no     Depression Counseling: not indicated; screening negative for depression  Safety-Violence-Falls     Seat Belt Use: 50      Sexual History:  currently monogamous.        Drug Use:  No.    Comments: pt. given condoms   Updated Prior Medication List: LEVOTHROID 150 MCG  TABS (LEVOTHYROXINE SODIUM) 1 tablet by mouth daily for thyroid PREVACID SOLUTAB 30 MG  TBDP (LANSOPRAZOLE) 1 tablet by mouth daily FERROUS SULFATE 325 (65 FE) MG  TBEC (FERROUS SULFATE) 1 tablet by mouth daily ALLEGRA 180 MG TABS (FEXOFENADINE HCL) 1 tablet by mouth daily TRIAMCINOLONE ACETONIDE 0.1 % LOTN (TRIAMCINOLONE ACETONIDE) apply topically to affected area two times a day PERCOCET 7.5-500 MG TABS (OXYCODONE-ACETAMINOPHEN) Take 1 tablet by mouth every 8 hours as needed HYDROCHLOROTHIAZIDE 25 MG TABS (HYDROCHLOROTHIAZIDE) Take 1 tablet by mouth once a day  Current Allergies (reviewed today): No known allergies  Past History:  Past Medical History: Last updated: 09/28/2006 HYPOTHYROIDISM (ICD-244.9) HSV (ICD-054.9)  Social History: Sexual History:  currently monogamous  Review of Systems  The patient denies anorexia, fever, and weight loss.    Vital Signs:  Patient profile:   55 year old female Menstrual status:  postmenopausal Height:      65 inches (165.10 cm) Weight:      321.8 pounds (146.27 kg) BMI:     53.74 Temp:     97.7 degrees F (36.50 degrees C) oral Pulse rate:   68 / minute BP sitting:   155 / 95  (right arm)  Vitals Entered By:  Wendall Mola CMA Duncan Dull) (May 19, 2009 3:39 PM) CC: pt. c/o left ankle pain Is Patient Diabetic? No Pain Assessment Patient in pain? yes     Location: left ankle Intensity: 8 Type: throbbing Onset of pain  Constant Nutritional Status BMI of > 30 = obese Nutritional Status Detail appetite "good"  Does patient need assistance? Functional Status Self care Ambulation Impaired:Risk for fall Comments pt. uses one crutch   Physical Exam  General:  alert, well-developed, well-nourished, and well-hydrated.   Head:  normocephalic and atraumatic.   Mouth:  pharynx pink and moist.   Msk:  left ankle with some soft tissue swelling no erythema or increased temp   Impression & Recommendations:  Problem # 1:  ANKLE PAIN, LEFT (ICD-719.47) R/O gout - check UA Orders: Est. Patient Level III (95638) T-Uric Acid (Blood) (75643-32951)  Problem # 2:  ELEVATED BLOOD PRESSURE WITHOUT DIAGNOSIS OF HYPERTENSION (ICD-796.2) will try HCTZ for BP and peripherla edema Her updated medication list for this problem includes:    Hydrochlorothiazide 25 Mg Tabs (Hydrochlorothiazide) .[name]... Take 1 tablet by mouth once a day  Problem # 3:  HYPOTHYROIDISM (ICD-244.9) check TSH Her updated medication list for this problem includes:    Levothroid 150 Mcg Tabs (Levothyroxine sodium) .[name]... 1 tablet by mouth daily for thyroid  Orders: T-TSH ([id number])  Medications Added to Medication List This Visit: 1)  Hydrochlorothiazide 25 Mg Tabs (Hydrochlorothiazide) .... Take 1 tablet by mouth once a day Prescriptions: HYDROCHLOROTHIAZIDE 25 MG TABS (HYDROCHLOROTHIAZIDE) Take 1 tablet by mouth once a day  #30 x 5   Entered and Authorized by:   Yisroel Ramming MD   Signed by:   Yisroel Ramming MD on 05/19/2009   Method used:   Print then Give to Patient   RxID:   [id number]

## 2010-02-01 NOTE — Progress Notes (Signed)
Summary: patient request rf for percocet  Prescriptions: PERCOCET 7.5-500 MG TABS (OXYCODONE-ACETAMINOPHEN) Take 1 tablet by mouth every 8 hours as needed  #90 x 0   Entered by:   Starleen Arms CMA   Authorized by:   Yisroel Ramming MD   Signed by:   Starleen Arms CMA on 05/28/2009   Method used:   Print then Give to Patient   RxID:   [id number]

## 2010-02-01 NOTE — Progress Notes (Signed)
Summary: REFERRAL HIP SPECIALIST  Phone Note Call from Patient   Caller: Patient Reason for Call: Referral Summary of Call: PT WANTS TO KNOW IF DR VOLLMER CAN REFER HER TO A HIP SPECIALIST . PLEASE. CALL HER @ [phone] THANK YOU  Initial call taken by: Cheryll Dessert,  March 04, 2009 4:49 PM  Follow-up for Phone Call        Levon Hedger  March 11, 2009 10:51 AM At subscribers request this caller does not except incoming calls.  Will mail letter. Follow-up by: Levon Hedger,  March 11, 2009 10:51 AM

## 2010-02-01 NOTE — Progress Notes (Signed)
Summary: PPD  Phone Note Outgoing Call   Call placed by: Annice Pih Summary of Call: Pt. needs PPD at next office visit Initial call taken by: Wendall Mola CMA Duncan Dull),  September 20, 2009 12:53 PM

## 2010-02-01 NOTE — Letter (Signed)
Summary: REFERRAL/2-D ECHOCARDIOGRAM//APPT DATE & TIME  REFERRAL/2-D ECHOCARDIOGRAM//APPT DATE & TIME   Imported By: Arta Bruce 01/21/2009 15:39:13  _____________________________________________________________________  External Attachment:    Type:   Image     Comment:   External Document

## 2010-02-01 NOTE — Consult Note (Signed)
Summary: [facility]  [facility]   Imported By: Florinda Marker 07/14/2009 09:37:47  _____________________________________________________________________  External Attachment:    Type:   Image     Comment:   External Document

## 2010-02-01 NOTE — Letter (Signed)
Summary: [facility] ACCESS//ENROLLMENT FORM  [place] ACCESS//ENROLLMENT FORM   Imported By: Arta Bruce 03/26/2009 11:54:57  _____________________________________________________________________  External Attachment:    Type:   Image     Comment:   External Document

## 2010-02-01 NOTE — Progress Notes (Signed)
Summary: RX  Phone Note Call from Patient   Caller: Patient Call For: Dr. Philipp Deputy Reason for Call: Refill Medication Summary of Call: Pt. called requesting pain medication refill Initial call taken by: Wendall Mola CMA Duncan Dull),  June 30, 2009 10:28 AM  Follow-up for Phone Call        ok x 1 Follow-up by: Yisroel Ramming MD,  June 30, 2009 10:34 AM    Prescriptions: PERCOCET 7.5-500 MG TABS (OXYCODONE-ACETAMINOPHEN) Take 1 tablet by mouth every 8 hours as needed  #90 x 0   Entered by:   Wendall Mola CMA ( AAMA)   Authorized by:   Yisroel Ramming MD   Signed by:   Wendall Mola CMA ( AAMA) on 06/30/2009   Method used:   Print then Give to Patient   RxID:   [id number] PERCOCET 7.5-500 MG TABS (OXYCODONE-ACETAMINOPHEN) Take 1 tablet by mouth every 8 hours as needed  #90 x 0   Entered by:   Wendall Mola CMA ( AAMA)   Authorized by:   Yisroel Ramming MD   Signed by:   Wendall Mola CMA ( AAMA) on 06/30/2009   Method used:   Print then Give to Patient   RxID:   [id number]  First RX printed on plain paper in error and discarded Marylen Ponto, CMA

## 2010-02-01 NOTE — Progress Notes (Signed)
Summary: [facility]: Nurses Note  [facility]: Nurses Note   Imported By: Florinda Marker 06/22/2009 14:23:57  _____________________________________________________________________  External Attachment:    Type:   Image     Comment:   External Document

## 2010-02-01 NOTE — Miscellaneous (Signed)
Summary: Orders Update  Clinical Lists Changes  Orders: Added new Referral order of Orthopedic Referral (Ortho) - Signed 

## 2010-02-01 NOTE — Miscellaneous (Signed)
Summary: Office Visit ([facility])    Vital Signs:  Patient profile:   55 year old female Menstrual status:  postmenopausal Weight:      97.1 pounds Temp:     97.1 degrees F oral BP sitting:   110 / 73  (left arm)  Vitals Entered By: Sharen Heck RN (March 25, 2009 2:16 PM) CC: f/u 05 Is Patient Diabetic? No Pain Assessment Patient in pain? yes     Location: right hip Intensity: 9 Type: aching  Does patient need assistance? Functional Status Self care Ambulation Impaired:Risk for fall Comments walks with crutch   CC:  f/u 05.  History of Present Illness: Pt saw the orthopedic surgeon today and will need her hip replaced.  She has to lose about 75 lbs before they will do surgery. She is in a lot of pain.  Preventive Screening-Counseling & Management  Alcohol-Tobacco     Alcohol drinks/day: occasionally     Smoking Status: never     Passive Smoke Exposure: Yes  Caffeine-Diet-Exercise     Caffeine use/day: 2     Does Patient Exercise: no     Depression Counseling: not indicated; screening negative for depression  Current Problems (verified): 1)  HIV Infection  (ICD-042) 2)  Premature Ventricular Contractions  (ICD-427.69) 3)  Other Screening Breast Examination  (ICD-V76.19) 4)  Routine Gynecological Examination  (ICD-V72.31) 5)  Elevated Blood Pressure Without Diagnosis of Hypertension  (ICD-796.2) 6)  Ankle Pain, Left  (ICD-719.47) 7)  Dental Caries  (ICD-521.00) 8)  Contact Dermatitis  (ICD-692.9) 9)  Stress Fracture  (ICD-733.95) 10)  Osteoarthritis, Hip, 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 1  Tablet By Mouth Daily For Thyroid 2)  Prevacid Solutab 30 Mg  Tbdp (Lansoprazole) .[name].. 1 Tablet By Mouth Daily 3)  Ferrous Sulfate 325 (65 Fe) Mg  Tbec (Ferrous Sulfate) .[name].. 1 Tablet By Mouth Daily 4)  Allegra 180 Mg Tabs (Fexofenadine Hcl) .[name].. 1 Tablet By Mouth Daily 5)  Triamcinolone Acetonide 0.1 % Lotn (Triamcinolone Acetonide) .... Apply Topically To Affected Area Two Times A Day 6)  Percocet 7.5-500 Mg Tabs (Oxycodone-Acetaminophen) .... Take 1 Tablet By Mouth Every 8 Hours As Needed  Allergies (verified): No Known Drug Allergies    Physical Exam  General:  alert, well-hydrated, and overweight-appearing.   Head:  normocephalic and atraumatic.   Mouth:  pharynx pink and moist.   Lungs:  normal breath sounds.     Impression & Recommendations:  Problem # 1:  OSTEOARTHRITIS, HIP, RIGHT (ICD-715.95) will need surger precocet rx given for pain control Her updated medication list for this problem includes:    Percocet 7.5-500 Mg Tabs (Oxycodone-acetaminophen) .[name]... Take 1 tablet by mouth every 8 hours as needed  Problem # 2:  HIV INFECTION (ICD-042) CD4ct 832 and VL low. No need for treatment at this time. F/u in 3 months. Diagnostics Reviewed:  HIV: REACTIVE (11/19/2008)   HIV-Western blot: Positive (11/19/2008)   WBC: 5.2 (03/18/2009)   Hgb: 10.3 (03/18/2009)   HCT: 33.7 (03/18/2009)   Platelets: 415 (03/18/2009) HIV-1 RNA: 1470 (03/18/2009)   HBSAg: NEG (03/18/2009)  Problem # 3:  HYPOTHYROIDISM (ICD-244.9) Will need TSH checked next visit. Her updated medication list for this problem includes:    Levothroid 150 Mcg Tabs (Levothyroxine sodium) .[name]... 1 tablet by mouth daily for thyroid   Patient Instructions: 1)  Please schedule a follow-up appointment in 3 months. Prescriptions: PERCOCET 7.5-500 MG TABS (OXYCODONE-ACETAMINOPHEN) Take 1 tablet by mouth every 8 hours as needed  #90 x 0   Entered and Authorized by:   Yisroel Ramming MD   Signed by:   Yisroel Ramming MD on  03/25/2009   Method used:   Print then Give to Patient   RxID:   [id number]

## 2010-02-01 NOTE — Miscellaneous (Signed)
  Clinical Lists Changes  Orders: Added new Referral order of Pain Clinic Referral (Pain) - Signed 

## 2010-02-01 NOTE — Letter (Signed)
Summary: TEST ORDER FORM//MAMMOGRAM  TEST ORDER FORM//MAMMOGRAM   Imported By: Arta Bruce 01/18/2009 11:48:19  _____________________________________________________________________  External Attachment:    Type:   Image     Comment:   External Document

## 2010-02-01 NOTE — Assessment & Plan Note (Signed)
Summary: 3month f/u [mkj]   CC:  follow-up visit and lab results.  History of Present Illness: Pt here for f/u on labs. She is going to have surgery on her left foot in the near future.  She has not been able to lose weight in order to have hip replacement surgery. She would consider bariatric sugery.  Preventive Screening-Counseling & Management  Alcohol-Tobacco     Alcohol drinks/day: occasionally     Smoking Status: never     Passive Smoke Exposure: Yes  Caffeine-Diet-Exercise     Caffeine use/day: 2     Does Patient Exercise: no     Depression Counseling: not indicated; screening negative for depression  Safety-Violence-Falls     Seat Belt Use: 50      Sexual History:  currently monogamous.        Drug Use:  No.     Updated Prior Medication List: LEVOTHROID 150 MCG  TABS (LEVOTHYROXINE SODIUM) 1 tablet by mouth daily for thyroid PREVACID SOLUTAB 30 MG  TBDP (LANSOPRAZOLE) 1 tablet by mouth daily FERROUS SULFATE 325 (65 FE) MG  TBEC (FERROUS SULFATE) 1 tablet by mouth daily ALLEGRA 180 MG TABS (FEXOFENADINE HCL) 1 tablet by mouth daily TRIAMCINOLONE ACETONIDE 0.1 % LOTN (TRIAMCINOLONE ACETONIDE) apply topically to affected area two times a day PERCOCET 7.5-500 MG TABS (OXYCODONE-ACETAMINOPHEN) Take 1 tablet by mouth every 8 hours as needed HYDROCHLOROTHIAZIDE 25 MG TABS (HYDROCHLOROTHIAZIDE) Take 1 tablet by mouth once a day  Current Allergies (reviewed today): No known allergies  Past History:  Past Medical History: Last updated: 09/28/2006 HYPOTHYROIDISM (ICD-244.9) 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    Vital Signs:  Patient profile:   55 year old female Menstrual status:  postmenopausal Height:      65 inches (165.10 cm) Weight:      322.4 pounds (146.55 kg) BMI:     53.84 Temp:     98.1 degrees F (36.72 degrees C) oral Pulse rate:   66 / minute BP sitting:   135 / 85  (left  arm)  Vitals Entered By: Wendall Mola CMA Duncan Dull) (July 12, 2009 2:54 PM) CC: follow-up visit, lab results Is Patient Diabetic? No Pain Assessment Patient in pain? yes     Location: right ankle Intensity: 9 Type: aching Onset of pain  Constant Nutritional Status BMI of > 30 = obese Nutritional Status Detail appetite "good"  Have you ever been in a relationship where you felt threatened, hurt or afraid?No   Does patient need assistance? Functional Status Self care Ambulation Normal Comments Pt. has missed her B/P meds for about one week   Physical Exam  General:  alert, well-developed, and overweight-appearing.   Head:  normocephalic and atraumatic.   Mouth:  pharynx pink and moist.   Lungs:  normal breath sounds.   Heart:  normal rate, regular rhythm, and no murmur.             Prevention For Positives: 07/12/2009   Safe sex practices discussed with patient. Condoms offered.   Education Materials Provided: 07/12/2009 Safe sex practices discussed with patient. Condoms offered.                          Impression & Recommendations:  Problem # 1:  HIV INFECTION (ICD-042) Pt currently asymptomatic and not on therapy.  Will repeat labs in 6 months. Diagnostics Reviewed:  HIV: REACTIVE (  11/19/2008)   HIV-Western blot: Positive (11/19/2008)   CD4: 880 (06/25/2009)   WBC: 4.9 (06/24/2009)   Hgb: 10.3 (06/24/2009)   HCT: 33.1 (06/24/2009)   Platelets: 406 (06/24/2009) HIV-1 RNA: 491 (06/24/2009)   HBSAg: NEG (03/18/2009)  Orders: Est. Patient Level IV (99214)Future Orders: T-CD4SP ([facility]) (CD4SP) ... 01/08/2010 T-HIV Viral Load [phone]) ... 01/08/2010 T-Comprehensive Metabolic Panel [phone]) ... 01/08/2010 T-CBC w/Diff ([id number]) ... 01/08/2010  Problem # 2:  OBESITY (ICD-278.00) refer for possible bariatric surgery. Orders: Surgical Referral (Surgery)  Problem # 3:  ANKLE PAIN, LEFT (ICD-719.47) surgery pending  Problem # 4:   OSTEOARTHRITIS, HIP, RIGHT (ICD-715.95) attempting for lose weight so she can have hip replacement surgery Her updated medication list for this problem includes:    Percocet 7.5-500 Mg Tabs (Oxycodone-acetaminophen) .[name]... Take 1 tablet by mouth every 8 hours as needed  Patient Instructions: 1)  Please schedule a follow-up appointment in 6 months, 2 weeks after labs.

## 2010-02-02 ENCOUNTER — Encounter (INDEPENDENT_AMBULATORY_CARE_PROVIDER_SITE_OTHER): Payer: Self-pay | Admitting: *Deleted

## 2010-02-09 ENCOUNTER — Encounter (INDEPENDENT_AMBULATORY_CARE_PROVIDER_SITE_OTHER): Payer: Self-pay | Admitting: *Deleted

## 2010-02-09 NOTE — Miscellaneous (Signed)
  Clinical Lists Changes  Observations: Added new observation of HOUSEINCOME: 0  (02/02/2010 12:01) Added new observation of YEARLYEXPEN: 0  (02/02/2010 12:01)

## 2010-02-17 ENCOUNTER — Telehealth (INDEPENDENT_AMBULATORY_CARE_PROVIDER_SITE_OTHER): Payer: Self-pay | Admitting: *Deleted

## 2010-02-17 NOTE — Miscellaneous (Signed)
  Clinical Lists Changes 

## 2010-02-21 ENCOUNTER — Encounter: Payer: Medicaid Other | Attending: Physical Medicine and Rehabilitation

## 2010-02-21 ENCOUNTER — Encounter (INDEPENDENT_AMBULATORY_CARE_PROVIDER_SITE_OTHER): Payer: Self-pay | Admitting: *Deleted

## 2010-02-21 ENCOUNTER — Ambulatory Visit (HOSPITAL_BASED_OUTPATIENT_CLINIC_OR_DEPARTMENT_OTHER): Payer: Medicaid Other | Admitting: Physical Medicine and Rehabilitation

## 2010-02-21 DIAGNOSIS — G894 Chronic pain syndrome: Secondary | ICD-10-CM

## 2010-02-21 DIAGNOSIS — M1610 Unilateral primary osteoarthritis, unspecified hip: Secondary | ICD-10-CM

## 2010-02-22 ENCOUNTER — Encounter: Payer: Self-pay | Admitting: Adult Health

## 2010-02-22 ENCOUNTER — Other Ambulatory Visit (INDEPENDENT_AMBULATORY_CARE_PROVIDER_SITE_OTHER): Payer: Medicaid Other

## 2010-02-22 ENCOUNTER — Other Ambulatory Visit: Payer: Self-pay | Admitting: Adult Health

## 2010-02-22 DIAGNOSIS — B20 Human immunodeficiency virus [HIV] disease: Secondary | ICD-10-CM

## 2010-02-22 LAB — CONVERTED CEMR LAB
ALT: 13 units/L (ref 0–35)
AST: 17 units/L (ref 0–37)
Basophils Absolute: 0 10*3/uL (ref 0.0–0.1)
Basophils Relative: 0 % (ref 0–1)
Chloride: 103 meq/L (ref 96–112)
Creatinine, Ser: 0.97 mg/dL (ref 0.40–1.20)
Eosinophils Absolute: 0.3 10*3/uL (ref 0.0–0.7)
Eosinophils Relative: 5 % (ref 0–5)
HCT: 35.9 % — ABNORMAL LOW (ref 36.0–46.0)
HIV 1 RNA Quant: 676 copies/mL — ABNORMAL HIGH (ref <20)
MCV: 85.5 fL (ref 78.0–100.0)
Neutrophils Relative %: 31 % — ABNORMAL LOW (ref 43–77)
Platelets: 374 10*3/uL (ref 150–400)
RDW: 14.9 % (ref 11.5–15.5)
Sodium: 137 meq/L (ref 135–145)
Total Bilirubin: 0.3 mg/dL (ref 0.3–1.2)
Total Protein: 8.5 g/dL — ABNORMAL HIGH (ref 6.0–8.3)

## 2010-02-23 ENCOUNTER — Ambulatory Visit (HOSPITAL_COMMUNITY)
Admission: RE | Admit: 2010-02-23 | Discharge: 2010-02-23 | Disposition: A | Discharge status: Home / Self Care | Payer: Medicaid Other | Source: Ambulatory Visit | Attending: Physical Medicine and Rehabilitation | Admitting: Physical Medicine and Rehabilitation

## 2010-02-23 ENCOUNTER — Encounter (INDEPENDENT_AMBULATORY_CARE_PROVIDER_SITE_OTHER): Payer: Self-pay | Admitting: *Deleted

## 2010-02-23 ENCOUNTER — Other Ambulatory Visit: Payer: Self-pay | Admitting: Physical Medicine and Rehabilitation

## 2010-02-23 DIAGNOSIS — M169 Osteoarthritis of hip, unspecified: Secondary | ICD-10-CM | POA: Insufficient documentation

## 2010-02-23 DIAGNOSIS — R52 Pain, unspecified: Secondary | ICD-10-CM

## 2010-02-23 DIAGNOSIS — M1610 Unilateral primary osteoarthritis, unspecified hip: Secondary | ICD-10-CM | POA: Insufficient documentation

## 2010-02-23 DIAGNOSIS — M25559 Pain in unspecified hip: Secondary | ICD-10-CM | POA: Insufficient documentation

## 2010-02-23 LAB — T-HELPER CELL (CD4) - (RCID CLINIC ONLY)
CD4 % Helper T Cell: 34 % (ref 33–55)
CD4 T Cell Abs: 1000 uL (ref 400–2700)

## 2010-02-23 NOTE — Progress Notes (Signed)
Summary: RW authorization completed  Phone Note Call from Patient   Summary of Call: Miranda Kelley reauthorization completed 01/31/2010 by Lonell Face

## 2010-02-24 ENCOUNTER — Encounter (INDEPENDENT_AMBULATORY_CARE_PROVIDER_SITE_OTHER): Payer: Self-pay | Admitting: *Deleted

## 2010-03-01 NOTE — Miscellaneous (Signed)
  Clinical Lists Changes 

## 2010-03-01 NOTE — Miscellaneous (Signed)
  Clinical Lists Changes  Observations: Added new observation of HOUSING: stable/permanent (12/21/2009 10:05)

## 2010-03-01 NOTE — Miscellaneous (Signed)
  Clinical Lists Changes  Observations: Added new observation of PAYOR: Medicaid (02/21/2010 10:16) Added new observation of FAMILYSIZE: 3  (02/21/2010 10:16) Added new observation of PCTFPL: 48.60  (02/21/2010 10:16) Added new observation of HOUSEINCOME: 8898  (02/21/2010 10:16) Added new observation of FINASSESSDT: 01/31/2010  (02/21/2010 10:16)

## 2010-03-08 ENCOUNTER — Ambulatory Visit: Payer: Medicaid Other | Admitting: Adult Health

## 2010-03-08 ENCOUNTER — Encounter: Payer: Self-pay | Admitting: Adult Health

## 2010-03-18 ENCOUNTER — Ambulatory Visit: Payer: Medicaid Other

## 2010-03-21 ENCOUNTER — Ambulatory Visit (HOSPITAL_BASED_OUTPATIENT_CLINIC_OR_DEPARTMENT_OTHER): Payer: Medicaid Other | Admitting: Physical Medicine and Rehabilitation

## 2010-03-21 ENCOUNTER — Encounter: Payer: Medicaid Other | Attending: Physical Medicine and Rehabilitation

## 2010-03-21 DIAGNOSIS — M169 Osteoarthritis of hip, unspecified: Secondary | ICD-10-CM | POA: Insufficient documentation

## 2010-03-21 DIAGNOSIS — Z79899 Other long term (current) drug therapy: Secondary | ICD-10-CM | POA: Insufficient documentation

## 2010-03-21 DIAGNOSIS — M25559 Pain in unspecified hip: Secondary | ICD-10-CM | POA: Insufficient documentation

## 2010-03-21 DIAGNOSIS — G8929 Other chronic pain: Secondary | ICD-10-CM | POA: Insufficient documentation

## 2010-03-21 DIAGNOSIS — M1610 Unilateral primary osteoarthritis, unspecified hip: Secondary | ICD-10-CM | POA: Insufficient documentation

## 2010-03-21 DIAGNOSIS — R5381 Other malaise: Secondary | ICD-10-CM | POA: Insufficient documentation

## 2010-03-21 DIAGNOSIS — G894 Chronic pain syndrome: Secondary | ICD-10-CM

## 2010-03-22 NOTE — Assessment & Plan Note (Signed)
Summary: new to np 47month f/u [mkj]   Vital Signs:  Patient profile:   55 year old female Menstrual status:  postmenopausal Height:      65 inches Weight:      317.8 pounds BMI:     53.08 Temp:     97.6 degrees F oral Pulse rate:   77 / minute BP sitting:   155 / 95  (left arm)  Vitals Entered By: Alesia Morin CMA (March 08, 2010 2:12 PM) CC: follow-up visit for labs Is Patient Diabetic? No Pain Assessment Patient in pain? no      Nutritional Status BMI of > 30 = obese Nutritional Status Detail appetite "good"  Have you ever been in a relationship where you felt threatened, hurt or afraid?No   Does patient need assistance? Functional Status Self care Ambulation Normal Comments not on meds at this time   CC:  follow-up visit for labs.  Preventive Screening-Counseling & Management  Alcohol-Tobacco     Alcohol drinks/day: occasionally     Smoking Status: never     Passive Smoke Exposure: Yes  Caffeine-Diet-Exercise     Caffeine use/day: 2     Does Patient Exercise: no     Depression Counseling: not indicated; screening negative for depression  Safety-Violence-Falls     Seat Belt Use: 50      Sexual History:  currently monogamous.        Drug Use:  No.        Blood Transfusions:  no.        Travel History:  no.    Comments: pt declined condoms  Allergies: No Known Drug Allergies  Social History: Blood Transfusions:  no Travel History:  no  Prescriptions: HYDROCHLOROTHIAZIDE 25 MG TABS (HYDROCHLOROTHIAZIDE) Take 1 tablet by mouth once a day  #30 x 5   Entered by:   Alesia Morin CMA   Authorized by:   Talmadge Chad NP   Signed by:   Alesia Morin CMA on 03/08/2010   Method used:   Electronically to        [facility] [phone]* (retail)       [address]       [place], [place]  [address]       Ph: [phone] or [phone]       Fax: [phone]   RxID:   [id number]

## 2010-04-19 ENCOUNTER — Encounter: Payer: Medicaid Other | Attending: Physical Medicine and Rehabilitation

## 2010-04-19 DIAGNOSIS — M25559 Pain in unspecified hip: Secondary | ICD-10-CM | POA: Insufficient documentation

## 2010-04-19 DIAGNOSIS — G8929 Other chronic pain: Secondary | ICD-10-CM | POA: Insufficient documentation

## 2010-04-19 DIAGNOSIS — M1610 Unilateral primary osteoarthritis, unspecified hip: Secondary | ICD-10-CM

## 2010-04-19 DIAGNOSIS — R5381 Other malaise: Secondary | ICD-10-CM | POA: Insufficient documentation

## 2010-04-19 DIAGNOSIS — G894 Chronic pain syndrome: Secondary | ICD-10-CM

## 2010-04-19 DIAGNOSIS — Z96659 Presence of unspecified artificial knee joint: Secondary | ICD-10-CM | POA: Insufficient documentation

## 2010-04-19 DIAGNOSIS — M169 Osteoarthritis of hip, unspecified: Secondary | ICD-10-CM | POA: Insufficient documentation

## 2010-04-19 DIAGNOSIS — M25579 Pain in unspecified ankle and joints of unspecified foot: Secondary | ICD-10-CM

## 2010-04-21 ENCOUNTER — Ambulatory Visit: Payer: Medicaid Other

## 2010-05-16 ENCOUNTER — Encounter
Payer: Medicaid Other | Attending: Physical Medicine and Rehabilitation | Admitting: Physical Medicine and Rehabilitation

## 2010-05-17 NOTE — Assessment & Plan Note (Signed)
[facility]                            CARDIOLOGY OFFICE NOTE   GISELLE, BRUTUS                          MRN:          [id number]  DATE:01/02/2007                            DOB:          [birth year]    I was asked by the at [facility] clinic to evaluate Miranda Kelley for  increasing lower extremity edema and chest discomfort.   She was seen in the emergency room in January of 2008 as well as  November 2008 for chest discomfort.   She is to 55 year old [place] American female, daughter of a patient of  mine, who comes today after visit to the emergency room in November.  She describes a sharp stabbing pain over her left breast and goes into  her left shoulder.  It is worse when she moves her upper torso.  It is  not worse with deep breath or cough.  She denies any fever, chills,  shortness of breath, or hemoptysis.   She had increasing lower extremity edema, which her husband, Everardo All,  says is getting worse over the last couple months.  She denies any  orthopnea or PND.   Her blood work in the emergency room on November 14, 2006 showed normal  CBC, normal electrolytes, and negative point of care markers.  She had  point of care markers as well in January 2008.   Chest x-ray did show some vascular congestion but this may have been  technique.   PAST MEDICAL HISTORY:  She does not smoke.  She has used cocaine in the  past and is worried that had an effect on her heart.  She does not drink  alcohol.   She does not exercise.   SURGERY:  Total knee replacement on the right.  She says her left needs  it as well.   CURRENT MEDICATIONS:  1. Levothyroxine unknown mg per day.  2. Multivitamin daily.  3. Aleve 1 t.i.d.   She has no known drug allergies.   FAMILY HISTORY:  Positive for diabetes.   SOCIAL HISTORY:  She works with [employer].  She has 4 children.  She  does not mention marital status.   REVIEW OF SYSTEMS:  Negative other than  the HPI.   EXAM:  Very pleasant lady in no acute distress.  Her blood pressure is 126/80.  Her pulse is 63 and regular.  EKG is  normal except for some nonspecific changes.  She is 5 feet 5 inches,  weighs 324 pounds.  HEENT:  Normocephalic, atraumatic.  PERRLA.  Extraocular movements  intact.  Sclerae are slightly muddy.  Facial symmetry is normal.  Dentition satisfactory.  Neck is supple.  Carotids reveal bilateral JVD.  Thyroid is not  enlarged.  Lungs were clear to auscultation.  No rales.  HEART:  Reveals a poorly appreciated PMI.  She has normal S1-S2 without  gallop.  ABDOMEN:  Obese, so organomegaly could not be assessed.  Bowel sounds  are present.  EXTREMITIES:  Reveal 1 to 2+ pitting edema, particularly the ankles.  Pulses were brisk.  NEURO:  Exam is  intact.   ASSESSMENT:  1. Noncardiac chest pain.  It seems like it is mostly local      musculoskeletal.  I can reproduce it with palpation, which I failed      to mention in the physical.  She can also do so.  2. Lower extremity edema.  There was a question of some pulmonary      vascular congestion on chest x-ray which I doubt.  3. Morbid obesity.   I suspect her edema is from her weight issues and lack of mobility.   PLAN:  1. Reduce salt intake.  2. Caloric restriction.  3. Increase activity as much as possible.  4. 2-D echocardiogram to assess right sided function and pressures as      well as LV function.     Thomas C. Daleen Squibb, MD, [facility]  Electronically Signed    TCW/MedQ  DD: 01/02/2007  DT: 01/02/2007  Job #: [id number]   cc:   Melvern Banker

## 2010-05-18 ENCOUNTER — Encounter: Payer: Medicaid Other | Attending: Neurosurgery | Admitting: Neurosurgery

## 2010-05-18 DIAGNOSIS — M25569 Pain in unspecified knee: Secondary | ICD-10-CM | POA: Insufficient documentation

## 2010-05-18 DIAGNOSIS — G8929 Other chronic pain: Secondary | ICD-10-CM | POA: Insufficient documentation

## 2010-05-18 DIAGNOSIS — M25579 Pain in unspecified ankle and joints of unspecified foot: Secondary | ICD-10-CM | POA: Insufficient documentation

## 2010-05-18 DIAGNOSIS — M1710 Unilateral primary osteoarthritis, unspecified knee: Secondary | ICD-10-CM

## 2010-05-18 NOTE — Assessment & Plan Note (Signed)
HISTORY OF PRESENT ILLNESS:  Miranda Kelley comes in today with complaints of her left ankle and knee pain.  She has been followed by Dr. Pamelia Hoit for sometime.  She states nothing has really changed other than some stressors in her life due to her son getting married.  Her average pain is about 9 and it is a stabbing pain.  General activity levels rates at a 10.  She does not indicate when the pain is worse or what improves it.  MOBILITY:  She walks without assistance, though she can walk for just a few seconds without any problem.  She does not climb steps but she does drive.  REVIEW OF SYSTEMS:  Notable for those difficulties, otherwise she is independent with her ADLs.  FAMILY HISTORY:  Unchanged.  SOCIAL HISTORY:  Unchanged.  PHYSICAL EXAMINATION:  VITAL SIGNS:  Blood pressure is 150/77, pulse 76, respirations 18, O2 sats 100% on room air. NEUROLOGIC:  Motor strength 5/5 with dorsiflexion, plantar flexion in her lower extremities.  She does have some swelling in the left ankle which she states Dr. Lestine Box has operated on in the past. PSYCHIATRIC:  She is alert and oriented x3.  Her affect is bright and alert.  She does walk with somewhat of a limp. CONSTITUTIONAL:  She is obese.  The patient requested refill on Percocet.  She did not bring her bottles in with her today.  As per Dr. Pamelia Hoit, we declined to fill these.  ASSESSMENT: 1. Status post left ankle repair by Dr. Lestine Box with chronic pain. 2. Left knee chronic pain.  PLAN: 1. We will await for the patient to bring back her bottles for pill     count before we assure her prescription. 2. She will obtain UDS today. 3. She will follow up in the clinic in 1 month.     Thelton Graca L. Blima Dessert Electronically Signed    RLW/MedQ D:  05/18/2010 12:17:16  T:  05/18/2010 23:49:31  Job #:  [id number]

## 2010-05-20 NOTE — Discharge Summary (Signed)
[place]. [facility]  Patient:    Miranda Kelley, Miranda Kelley                          MRN: [id number] Adm. Date:  [id number] Disc. Date: [id number] Attending:  Herold Harms Dictator:   Bynum Bellows. Idacavage, P.A.C. CC:         Rodney A. Chaney Malling, M.D.                           Discharge Summary  DISCHARGE DIAGNOSES: 1. Right total knee arthroplasty. 2. Obesity. 3. Hypothyroidism. 4. Hypokalemia. 5. Anemia. 6. Pain issues.  HISTORY OF PRESENT ILLNESS:  The patient is a 55 year old female with a history of right knee open meniscotome in 1981, developed increasing knee pain with no relief with conservative care.  She elected to undergo right  total knee arthroplasty June 21, 1999 by Dr. Chaney Malling.  She was allowed partial weightbearing.  She was placed on Coumadin for DVT prophylaxis.   At the time of discharge from acute hospital, the patient was minimal assist with bed mobility and transfers and able to ambulate 30 feet with a standard walker and close supervision.  PAST MEDICAL HISTORY:  Significant for hypothyroidism, obesity, status post hernia repair, remote history of smoking.  SOCIAL HISTORY:  The patient was independent with a cane and walker prior to admission.  She planned to stay with her mother after discharge in a two-level home with two sets of six steps as well as she does have four children ages 65, 108, 13, and 6.  HOSPITAL COURSE:  The patient was admitted to acute inpatient rehab on the 22nd of June where she was given the opportunity for greater than three hours per day of physical and occupational therapies.  It was noted that on the 23rd, her H&H was 8.0 and 24.8, and two units of packed red blood cells were ordered; however, the patient was unable to tolerate even one unit of blood being transfused.  She complained of pain at the IV site and demanded that the blood be taken down.  She was supplemented with oral iron for her anemia.  She also  had immense problems with pain control and was titrated upward on OxyContin controlled release.  She also had problems following protocol for asking for assistance and often times had family members assisting her to the bathroom and would not call for nursing assist.  Ultimately she was discharged home on the 29th, at which time she was noted to be minimal assist with bed mobility, modified independent with transfers and able to ambulate 150 feet with a standard walker at the modified independence level.  DISCHARGE MEDICATIONS: 1. OxyContin CR 40 mg every 12 hours on a taper. 2. OxyIR 5 mg every three hours as needed. 3. Coumadin 2 mg daily until July 27. 4. Ferrous sulfate 325 mg three times a day. 5. Synthroid 150 mcg daily.  ACTIVITY:  Partial weightbearing.  DIET:  Balanced.  WOUND CARE:  She is instructed to keep the wound clean and dry.  FOLLOW-UP:  Will have protimes drawn July 3, results to Dr. Thomasena Edis.  She is instructed to follow-up with Dr. Chaney Malling in two weeks.  CONDITION ON DISCHARGE:  Stable. DD:  07/29/99 TD:  08/01/99 Job: [id number]

## 2010-05-20 NOTE — Discharge Summary (Signed)
[place]. [facility]  Patient:    Miranda Kelley, Miranda Kelley                          MRN: [id number] Adm. Date:  [id number] Disc. Date: [id number] Attending:  Herold Harms Dictator:   Jamelle Rushing, P.A.                           Discharge Summary  ADMITTING DIAGNOSES: 1. Osteoarthritis right knee. 2. Hypothyroidism. 3. Obesity.  DISCHARGE DIAGNOSES: 1. Status post right total knee arthroplasty. 2. Hypothyroidism. 3. Obesity. 4. Postoperative blood loss anemia.  HISTORY OF PRESENT ILLNESS:  This is a 55 year old black female with a history of worsening right knee pain since 1995.  The patient has a history of right open medial meniscectomy in 1981 and a right knee arthroscopy in 1995.  The patient presents with a six year history of progressively worsening right knee pain.  The pain is described as constant, sharp, throbbing sensation located medially with radiation.  It is worse with walking and standing.  It improves with rest.  The patient does have edema, popping, and giving-way sensation. The patient does not have any locking or grinding.  There are no paresthesias related, and she is currently using a cane when needed.  MEDICATIONS: 1. Synthroid 150 mcg p.o. q.d. 2. Celebrex 200 mg p.o. q.d and b.i.d. p.r.n. 3. Tylenol PM 2 tablets p.o. q.hs. 4. Tylenol 650 mg p.o. q.hs.  ALLERGIES:  No known drug allergies.  SURGICAL PROCEDURE:  On June 21, 1999, the patient was taken to the OR by Dr. Lenard Galloway. Mortenson, assisted by Arnoldo Morale, P.A.-C.  Under general anesthesia, the patient had a total knee replacement on the right.  The patient tolerated the procedure well and was transferred to the recovery room in excellent condition.  There were no complications.  COMPLICATIONS:  None.  CONSULTS:  On June 19, following routine consults requested physical therapy, occupational therapy, rehab, and care management.  HOSPITAL COURSE:  On June 21, 1999, the  patient was admitted to [facility] under the care of Dr. Thereasa Distance A. Mortenson.  The patient was taken to the OR where a right total knee arthroplasty Press-Fit was performed under general anesthesia.  The patient tolerated the procedure well and was transferred to the recovery room and then ultimately to the orthopaedic floor without any further complications.  The patient had a three day postoperative course with the only significant problem being a drop in her H&H.  The patient did drop to 7.3 over 24.4 on postop day #2, so she was typed and crossed for two units of packed red blood cells.  The patients potassium did drop to 3.0 on postop day #3, so she was started on K-Dur 20 mEq p.o. q.d for that.  The patient tolerated the transfusion very well, and her H&H improved to 8.7 over 26.2.  The patient remained asymptomatic after the blood transfusion.  Her vital signs were stable, and she was afebrile.  The patient, due to slow progress with physical therapy, was recommended to be transferred to the rehab for further physical therapy, and this was accomplished on postop day #3.  CONDITION ON DISCHARGE:  The patients condition on discharge to rehab services is listed as improved and good.  MEDICATIONS ON DISCHARGE TO REHAB: 1. OxyContin CR 20 mg p.o. q.12h. 2. Ferrous sulfate 325 mg p.o. b.i.d. 3.  Synthroid 150 mcg p.o. q.d. 4. Senokot 1 tablet p.o. b.i.d. before meals. 5. Docusate 100 mg p.o. b.i.d. 6. Coumadin per pharmacy dosing. 7. K-Dur 20 mEq p.o. q.d. 8. OxyIR/Percocet 1-2 tablets p.o. q.4-6h p.r.n. pain.  LABORATORY DATA:  EKG on admission was normal sinus rhythm at 64 beats per minute.  Chest x-ray showed no active disease.  Heart size upper limites of normal.  CBC on June 22 found WBC 14.8, hemoglobin 8.7, hematocrit 26.2, platelets 291.  Coag studies on June 22 found PT of 20.2 with an INR of 2.3. Routine chemistries on June 22 found potassium 3.0, BUN 6,  creatinine 1.0. All other values normal.  Urinalysis on admission found few epithelial cells, rare bacteria DD:  07/23/99 TD:  07/26/99 Job: [id number]

## 2010-05-20 NOTE — H&P (Signed)
[place]. [facility]  Patient:    SADHANA, FRATER                          MRN: [id number] Adm. Date:  06/21/99 Attending:  Thereasa Distance A. Chaney Malling, M.D. Dictator:   Arnoldo Morale, P.A.                         History and Physical  DATE OF BIRTH:  1955-05-31  CHIEF COMPLAINT:  Progressively worsening right knee pain for the last 2-3 years.  HISTORY OF PRESENT ILLNESS:  This 55 year old black female presented to Dr. Chaney Malling with a history of a right knee open meniscectomy in about 1981.  She reports she had a softball injury of her knee at that time.  She did well until March 1995, when she felt her knee was popping out of place again, and Dr. Chaney Malling did a right knee arthroscopy in March 1995.  He found severe osteoarthritis with some osteophytes and some area of bare bone in the medial compartment.  She reports she has had pain on and off since 1995, but it has gotten progressively worse over the last 2-3 years, and is now fairly severe.  She has no new injury to the knee.  The pain in her knee is pretty much constant and is located over the medial aspect of the knee without radiation. It is described as a sharp throbbing pain which increases with walking and standing and going up or down stairs, and decreases with rest.  She reports she is unable to take stairs, alternating legs, has to go up with the left leg, and then pull the right leg up with the use of the stair railing.  She does have swelling of the knee, and the knee does pop, and feels like it might give way at times.  It does not lock up, grind, or have any numbness or tingling associated with it.  She has had no problems with her back or hips hurting, and she does have to ambulate with the use of a cane p.r.n.  She has tried cortisone shots in the past, and is on Celebrex, and that is providing just minimal relief of her pain.  ALLERGIES:  No known drug allergies.  CURRENT  MEDICATIONS: 1. Synthroid 150 mcg one tablet p.o. q.d. 2. Celebrex 200 mg p.o. b.i.d. 3. Tylenol PM two tablets p.o. q.h.s. 4. Tylenol 650 mg p.o. q.h.s.  PAST MEDICAL HISTORY:  She was diagnosed with hypothyroidism in 1992, and does not have a regular medical doctor.  She is followed by [facility].  She denies any history of diabetes mellitus, hypertension, hiatal hernia, peptic ulcer disease, heart disease, asthma, or any other chronic medical condition other than noted previously.  PAST SURGICAL HISTORY: 1. Right knee open meniscectomy in 1981. 2. Right knee arthroscopy in March 1995 by Dr. Rinaldo Ratel. 3. Right femoral hernia repair when she was a young child.  SOCIAL HISTORY:  She has a very distant cigarette smoking history.  She smoked probably for about 4 years, just several cigarettes a day, and she quit 23 years ago.  She denies any use of alcohol or drugs.  She is divorced, and has 4 children, ages 69, 49, 41, and 27.  She currently lives with three of her children in a one story house with four steps into the main entrance.  She was working at  [employer], but is unable to do that because of her knees, so she is unemployed at this time.  FAMILY HISTORY:  Her mother is alive at age 58 with a history of diabetes and coronary artery disease.  Her father is alive at age 63 with no major medical problems.  She has one brother age 84 with osteoarthritis, and one sister age 84 who is alive and well.  Her children are all healthy.  REVIEW OF SYSTEMS:  She does have occasional sinus headaches in the fall and spring, and treats that with Sinus Tylenol.  Her last normal menstrual period was June 05, 1999.  She does have some osteoarthritis in her hands bilaterally and her left ankle.  All other systems are negative and noncontributory at this time.  PHYSICAL EXAMINATION:  GENERAL:  This is a well-developed, well-nourished, obese, black female in  no acute distress.  She talks easily and appropriately with the examiner, and is accompanied by her mother.  VITAL SIGNS:  Her height is 5 feet 6 inches, weight is 245 pounds, and her BMI is 39.  Temperature 98.4 degrees Fahrenheit, pulse 72, respirations 20, and blood pressure 128/90.  HEENT:  Her hair is dyed blonde at this time.  Head is normocephalic, atraumatic without frontal or maxillary sinus tenderness to palpation. Conjunctivae pink, sclerae anicteric.  Pupils are equal, round and reactive to light and accommodation.  Extraocular movements intact.  Funduscopic examination shows visible red reflex bilaterally with normal retinal vasculature, optic discs, and cup.  No visible external ear deformities. Hearing grossly intact.  Tympanic membranes pearly gray bilaterally with good light reflex.  Nose and nasal septum midline.  Nasal mucosa pink and moist without exudates or polyps noted.  Buccal mucosa pink and moist.  Good dentition.  Pharynx without erythema or exudates.  Tongue and uvula midline. Tongue without fasciculations and uvula rises easily with phonation.  NECK:  No visible masses or lesions noted.  Trachea midline.  No palpable lymphadenopathy, no thyromegaly.  Carotids +2 bilaterally without bruits. Full range of motion of her cervical spine, and she is nontender to palpation over her cervical spine.  CARDIOVASCULAR:  Heart rate and rhythm are regular.  S1 and S2 are present without rubs, clicks, or murmurs noted.  RESPIRATORY:  Respirations even and unlabored.  Breath sounds clear to auscultation bilaterally without rales or wheezes noted.  ABDOMEN:  Rounded abdominal contour.  Bowel sounds present x 4 quadrants. Soft and nontender to palpation without hepatosplenomegaly nor CVA tenderness. Femoral pulses are +2 bilaterally.  Nontender to palpation over the entire length of her vertebral collum.  BREASTS/GU/RECTAL/PELVIC:  These exams deferred at this  time.  MUSCULOSKELETAL:  She does have a ganglion that is visible over her right distal radius near the wrist.  This is nontender to palpation and is soft.  There is no other obvious deformities of her bilateral upper extremities.  She has full range of motion of these extremities without pain.  Radial pulses are +2 bilaterally.  She has full range of motion of her hips, ankles, and toes bilaterally.  DP and PT pulses are +2 bilaterally.  She has about 90 to 100 degrees of flexion of the left knee with full extension.  She does have some pain with palpation along the medial joint line of the left knee, none laterally.  There is no effusion.  There is minimal crepitus with range of motion of her left knee.  Collateral ligaments are stable.  She has a  moderate amount of crepitus with range of motion of her right knee. She is able to flex the right knee to only about 90 degrees with full extension, but this is painful.  There is no effusion of the knee.  She does have pain with palpation on both the medial and lateral joint line of the knee.  She has a well-healed faded scar over the medial aspect of the right knee joint.  She also has arthroscopy portals that are well-healed and approximated.  NEUROLOGIC:  Alert and oriented x 3.  Cranial nerves II-XII grossly intact. Strength 5/5 bilateral upper and lower extremities.  Rapid alternating movements and sensation are intact.  Deep tendon reflexes are 2+ bilateral upper and lower extremities.  LABORATORY DATA:  X-rays taken of her right knee in April 2001, show severe osteoarthritis in the medial compartment of the right knee with bone-on-bone articulation in this compartment.  There are also degenerative changes noted in the lateral compartment with osteophytes in the posterior aspect of the patella.  IMPRESSION: 1. Osteoarthritis of the right knee. 2. Hypothyroidism. 3. Obesity.  PLAN:  Ms. Laflamme will be admitted to [facility] on June 21, 1999, where she will undergo a right total knee arthroplasty by Dr. Rinaldo Ratel.  She will undergo all the routine preoperative laboratory testing studies prior to this procedure.  If her blood pressure remains elevated while she is in the hospital we will get a medical teaching service or family practice teaching service consult to follow her medical problems while she is in the hospital. DD:  06/15/99 TD:  06/15/99 Job: [id number]

## 2010-05-20 NOTE — Discharge Summary (Signed)
[place]  Patient:    Miranda Kelley, Miranda Kelley                          MRN: [id number] Adm. Date:  [id number] Disc. Date: [id number] Attending:  Herold Harms Dictator:   Jamelle Rushing, P.A.                           Discharge Summary  ADMISSION DIAGNOSES: 1. Osteoarthritis right knee. 2. Hypothyroidism. 3. Obesity.  DISCHARGE DIAGNOSES: 1. Status post right total knee arthroplasty. 2. Hypothyroidism. 3. Obesity. 4. Postoperative blood loss anemia.  HISTORY OF PRESENT ILLNESS:  This is a 55 year old black female with a history of worsening right knee pain since 1995.  The patient has a history of right open medial meniscectomy in 1981, and a right knee arthroscopy in 1995.  The patient presents with a six-year history of progressively worsening right knee pain.  The pain is described as constant, sharp, throbbing sensation located medially with radiation.  It is worse with walking and standing.  It improves with rest.  The patient does have edema, popping, and giving-away sensation. The patient does not have any locking or grinding.  There are no paresthesias, and she is currently using a cane when needed.  MEDICATIONS: 1. Synthroid 150 mcg p.o. q.d. 2. Celebrex 200 mg p.o. q.d. and b.i.d. p.r.n. 3. Tylenol PM 2 tablets p.o. q.h.s. 4. Tylenol 650 mg p.o. q.h.s.  ALLERGIES:  No known drug allergies.  SURGICAL PROCEDURE:  On June 21, 1999, the patient was taken to the OR by Dr. Lenard Galloway. Mortenson, assisted by Arnoldo Morale, P.A.C.  Under general anesthesia, the patient had a total knee replacement on the right.  The patient tolerated the procedure well and was transferred to the recovery room in excellent condition.  There were no complications.  COMPLICATIONS:  None.  CONSULTATIONS:  On June 21, 1999, the following routine consults were requested: 1. Physical therapy. 2. Occupational therapy. 3. Rehabilitation. 4. Care  management.  HOSPITAL COURSE:  On June 21, 1999, the patient was admitted to [facility] under the care of Dr. Thereasa Distance A. Mortenson.  The patient was taken to the OR where a right total knee arthroplasty Press-Fit was performed under general anesthesia.  The patient tolerated the procedure well and was transferred to the recovery room and then ultimately to the orthopedic floor without any further complications.  The patient had a three-day postoperative course with the only significant problem being a drop in her H&H.  The patient did drop to 7.3/24.4 on postoperative day #2, so she was typed and crossed for two units of packed red blood cells.  The patients potassium did drop to 3.0 on postoperative day #3, so she was started on K-Dur 20 mEq p.o. q.d. for that.  The patient tolerated the transfusion very well, and her H&H improved to 8.7/26.2.  The patient remained asymptomatic after the blood transfusion.  Her vital signs were stable, and she was afebrile.  The patient, due to slow progress with physical therapy, was recommended to be transferred to rehabilitation for further physical therapy, and this was accomplished on postoperative day #3.  CONDITION ON DISCHARGE TO REHABILITATION SERVICES:  Improved and good.  MEDICATIONS ON DISCHARGE TO REHABILITATION: 1. OxyContin CR 20 mg p.o. q.12h. 2. Ferrous sulfate 325 mg p.o. b.i.d. 3. Synthroid 150 mcg p.o. q.d. 4. Senokot 1 tablet p.o.  b.i.d. before meals. 5. Docusate 100 mg p.o. b.i.d. 6. Coumadin per pharmacy dosing. 7. K-Dur 20 mEq p.o. q.d. 8. OxyIR/Percocet 1 or 2 tablets p.o. q.4-6h. p.r.n. pain.  LABORATORY DATA:  EKG on admission was normal sinus rhythm at 64 beats per minute.  Chest x-ray showed no active disease.  Heart size upper limits of normal.  CBC on June 24, 1999, found WBC 14.8, hemoglobin 8.7, hematocrit of 26.2, platelets of 291.  Coagulation studies on June 24, 1999, found PT of 20.2, with an INR of  2.3.  Routine chemistries on June 24, 1999, found potassium of 3.0, BUN 6, creatinine 1.0, other values normal.  Urinalysis on admission found few epithelial cells, rare bacteria, all other values within normal limits.  DISPOSITION:  The patient was discharged to rehabilitation services for further evaluation in improved and good condition. DD:  07/23/99 TD:  07/26/99 Job: [id number]

## 2010-05-20 NOTE — Op Note (Signed)
[place]. [facility]  Patient:    Miranda Kelley, Miranda Kelley                          MRN: [id number] Proc. Date: 06/21/99 Adm. Date:  [id number] Attending:  Cornell Barman                           Operative Report  PREOPERATIVE DIAGNOSIS:  Severe osteoarthritis right knee.  POSTOPERATIVE DIAGNOSIS:  Severe osteoarthritis right knee.  OPERATION:  Total knee replacement on the right with press-fit, deep-dish, rotating platform, insert 10 mm thick, large size with a large plus pore-coated rotating platform tibial plateau, with a large size pore-coated femoral component and a pore-coated 3-pegged patella large size.  ANESTHESIA:  General.  SURGEON:  Rodney A. Chaney Malling, M.D.  ASSISTANT:  Arnoldo Morale, P.A.  PROCEDURE:  Patient was placed on the operating table in supine position with a pneumatic tourniquet about the right upper thigh.  After satisfactory general anesthesia, the right lower extremity was prepped with Duraprep and then draped out in usual manner.  The leg was then wrapped out with an Esmarch and the tourniquet was elevated.  A straight incision made starting above the patella and carried down to the tibial tubercle.  Skin edges were retracted. Bleeders were coagulated.  A long median parapatellar incision was then done with the cutting cautery.  The patella was everted and the knee was flexed 90 degrees.  Both medial and lateral meniscus were excised.  Tibial guide #1 was placed over the anterior aspect of the tibia and the cutting block was brought to what was felt to be the appropriate cutting level.  This was pinned with fixation pins and the external tower was applied and the alignment of the cutting block looked very nice.  The capture guide was placed over the proximal cutting block and the proximal end of the tibia was then amputated. This was removed as a single large block and a very nice cut was achieved. Attention was then turned to  the distal end of the femur.  A rongeur was used to trim off the osteophytes on either side of the femur and a notch was made over the anterior aspect of the femur.  The femoral guide #1 was placed over the proximal end of the femur and held in position and a drill hole was in the femur and intramedullary rod was inserted.  With the knee flexed 90 degrees, the C-clamp was inserted and placed on the cut end of the tibia.  This set up the rotation of the femur very nicely.  The femoral cutting block was then pinned with fixation pins.  Capture guide was used and anterior and posterior femoral cuts were made.  When this was done, a 10 mm spacer block was inserted and with the knee flexed, both the medial and lateral collateral ligaments ______ beautifully.  Femoral cutting block #2 was placed over the anterior aspect of the femur with intramedullary rod in place.  There was 4 degrees of valgus set into the cutting block.  This was fixed in positive and again, a spacer guide was held in position and felt to be the appropriate amount of cut.  Distal femur was then amputated.  With the knee extended, the spacer block was inserted and there was full extension of the knee with excellent balance of the medial and lateral collateral ligaments.  A  chamfer guide was then passed over the distal end of the femur, fixed in position with fixation pins and appropriate cuts were made.  At this point, tibia was slipped forward.  The soft tissues were stripped off the medial side as the medial side was slightly tight.  The tibial tower was placed over the proximal end of the tibia and pinned in place.  The drill was used to make a hole for the central peg of the tibial trial.  The cutting tower was removed and the tibia trial was inserted and this had fit nicely in line to line.  A 10 mm rotating platform was then inserted and a trial femoral component was placed over the distal end of the femur.  The knee was  then articulated and put through a full range of motion.  There was full flexion, full extension, slight hyperextension, in fact there was excellent stability of the collateral ligaments both in flexion and extension.  The knee was well-balanced throughout range of motion.  The posterior aspect of the patella was then exposed and the patella cutting clamp was inserted and locked in place.  The posterior aspect of the patella was then amputated.  The three holes were then drilled on the posterior aspect of the patella using the drill guide.  A trial patella was inserted and the knee was put through a full range of motion. There was slight lateral subluxation and a small lateral release was done. This allowed the patella to track directly in the midline.  All of the components were then removed.  The pulsating lavage was used to irrigate all debris out.  The pore-coated components were then selectively put into place. The tibia was inserted first, followed by the deep-dish, rotating platform insert, followed by the primary pore-coated femoral component.  These all fit beautifully with excellent fixation.  The 3-peg rotating, pore-coated patella was then inserted and locked in position.  The knee was then put through a full range of motion.  There was wonderful stability with excellent range of motion.  The tourniquet was dropped and all bleeders were coagulated.  There was very little bleeding.  A Hemovac drain was inserted and the knee was closed with interrupted, heavy Ethibond suture with the knee flexed 90 degrees.  Subcutaneous tissues closed with Vicryl and the skin closed with stainless steel staples.   Sterile dressings were applied and the patient returned to recovery room in excellent condition.  Technically, this procedure went extremely well.  Complications - none. DD:  06/21/99 TD:  06/23/99 Job: [id number]

## 2010-05-31 ENCOUNTER — Other Ambulatory Visit: Payer: Self-pay | Admitting: Orthopedic Surgery

## 2010-05-31 ENCOUNTER — Encounter (HOSPITAL_COMMUNITY): Payer: Medicaid Other

## 2010-05-31 LAB — DIFFERENTIAL
Basophils Absolute: 0 10*3/uL (ref 0.0–0.1)
Eosinophils Relative: 6 % — ABNORMAL HIGH (ref 0–5)
Lymphocytes Relative: 45 % (ref 12–46)
Lymphs Abs: 2.7 10*3/uL (ref 0.7–4.0)
Neutro Abs: 2.4 10*3/uL (ref 1.7–7.7)

## 2010-05-31 LAB — URINALYSIS, ROUTINE W REFLEX MICROSCOPIC
Bilirubin Urine: NEGATIVE
Glucose, UA: NEGATIVE mg/dL
Hgb urine dipstick: NEGATIVE
Specific Gravity, Urine: 1.023 (ref 1.005–1.030)
Urobilinogen, UA: 0.2 mg/dL (ref 0.0–1.0)
pH: 5 (ref 5.0–8.0)

## 2010-05-31 LAB — CBC
HCT: 35.5 % — ABNORMAL LOW (ref 36.0–46.0)
Hemoglobin: 11 g/dL — ABNORMAL LOW (ref 12.0–15.0)
MCV: 84.1 fL (ref 78.0–100.0)
RBC: 4.22 MIL/uL (ref 3.87–5.11)
RDW: 15.1 % (ref 11.5–15.5)
WBC: 6 10*3/uL (ref 4.0–10.5)

## 2010-05-31 LAB — PROTIME-INR
INR: 1.14 (ref 0.00–1.49)
Prothrombin Time: 14.8 seconds (ref 11.6–15.2)

## 2010-05-31 LAB — BASIC METABOLIC PANEL
CO2: 27 mEq/L (ref 19–32)
Calcium: 9.5 mg/dL (ref 8.4–10.5)
Creatinine, Ser: 0.97 mg/dL (ref 0.4–1.2)
GFR calc Af Amer: >60 mL/min (ref >60)
GFR calc non Af Amer: 60 mL/min — ABNORMAL LOW (ref >60)
Sodium: 136 mEq/L (ref 135–145)

## 2010-05-31 LAB — APTT: aPTT: 39 seconds — ABNORMAL HIGH (ref 24–37)

## 2010-05-31 LAB — SURGICAL PCR SCREEN: MRSA, PCR: NEGATIVE

## 2010-05-31 NOTE — H&P (Signed)
NAME:  Miranda, EFAW NO.:  [id number]  MEDICAL RECORD NO.:  [id number]          PATIENT TYPE:  LOCATION:                                 FACILITY:  PHYSICIAN:  Madlyn Frankel. Charlann Boxer, M.D.  DATE OF BIRTH:  [birth year]  DATE OF ADMISSION: DATE OF DISCHARGE:                             HISTORY & PHYSICAL   DATE OF SURGERY:  June 07, 2010  ADMISSION DIAGNOSIS:  Right hip osteoarthritis.  HISTORY OF PRESENT ILLNESS:  This is a 55 year old lady with a history of osteoarthritis of her right hip that has failed conservative management.  After discussion of treatment, benefits, risks and options, the patient now for total hip arthroplasty on the right.  Note that the patient is HIV positive with a low CD-4 count.  The surgery risks, benefits, and aftercare were discussed with the patient.  Questions invited and answered.  She understands with her large size that she is at increased risk of infection and dislocation postop.  She is a candidate for tranexamic acid and we will get that in preop per protocol and is given home prescriptions including aspirin 325 p.o. b.i.d. for 4 weeks Robaxin, iron, MiraLax, and Colace.  PAST MEDICAL HISTORY:  Drug allergies none.  CURRENT MEDICATIONS:  Levothyroxine 137 mcg once a day.  SERIOUS MEDICAL ILLNESSES:  HIV and hypothyroidism.  PREVIOUS SURGERIES:  Total knee replacement.  FAMILY HISTORY:  Positive for diabetes and coronary artery disease.  SOCIAL HISTORY:  The patient is widowed.  She does not smoke and does not drink.  REVIEW OF SYSTEMS:  CENTRAL NERVOUS SYSTEM:  Negative for headache, blurred vision, or dizziness.  PULMONARY:  No shortness breath, PND or orthopnea.  CARDIOVASCULAR:  No chest pain or palpitation.  GI: Negative for ulcers or hepatitis.  GU:  Negative for urinary tract difficulty.  MUSCULOSKELETAL:  Positive as in HPI.  PHYSICAL EXAMINATION:  VITAL SIGNS:  BP 140/90, respirations 18, pulse 78 and  regular. GENERAL APPEARANCE:  This is a well-developed, well-nourished lady in no acute distress. HEENT:  Head normocephalic.  Nose patent.  Ears patent.  Pupils are equal, round, and reactive to light.  Throat without injection. NECK:  Supple without adenopathy.  Carotids 2+ without bruit. CHEST:  Clear to auscultation.  No rales or rhonchi.  Respirations 18. HEART:  Regular rate and rhythm at 78 beats per minute without murmur. ABDOMEN:  Soft.  Active bowel sounds.  No masses or organomegaly. NEUROLOGIC:  The patient is alert and oriented to time, place, and person. NEUROLOGICAL:  Cranial nerves II through XII grossly intact. EXTREMITIES:  Shows the right hip with 0 to 80 degree range of motion with a 20-degree arc of rotation with pain.  Neurovascular status intact.  IMPRESSION:  Right hip osteoarthritis.  PLAN:  Total hip arthroplasty of right hip.     Jaquelyn Bitter. Chabon, P.A.   ______________________________ Madlyn Frankel Charlann Boxer, M.D.    SJC/MEDQ  D:  05/25/2010  T:  05/25/2010  Job:  [id number]  Electronically Signed by Jodene Nam P.A. on 05/27/2010 08:06:04 AM Electronically Signed by Durene Romans M.D. on 05/31/2010  08:56:48 AM

## 2010-06-01 NOTE — Assessment & Plan Note (Signed)
[name] is a pleasant 55 year old African American woman who has a morbidly obese.  She is followed at our [facility] for chronic right hip pain.  She was last seen by me on February 21, 2010.  She is return to our clinic for refill of her pain medication.  She has been using Percocet 7.5/325 up to three times a day.  With progression of her hip pain, radiographs were ordered on February 23, 2010.  Her radiographs showed progression of significant degenerative joint disease of the right hip involving almost complete loss of her joint space, sclerosis, spurring and subchondral cyst formation.  Left hip is relatively stable with very little degenerative changes.  The results of these radiographs were reviewed with her.  She reports continued pain with this is hip especially with movement of the hip, it is about 9 on a scale of 10.  The pain interferes with her activity levels.  She has limited ambulation capacity.  She can go about 2 minutes.  She uses a crutch for ambulation, refuses cane at this time.  She is independent with her self-care.  She reports no other problems with respect to review of systems.  No changes in past medical, social or family history.  Other than, she tells me she has followed up with Dr. Charlann Boxer and he had put her on the operating room schedule apparently for June 07, 2010.  On exam today, her blood pressure is 124/64, pulse 86, respiration 18, 97% saturated on room air.  She is a morbidly obese woman who does not appear in any distress.  She is oriented x3.  Speech is clear.  Affect is bright.  She is alert, cooperative and pleasant.  Follows commands without difficulty.  Answers my questions appropriately.  Cranial nerves and coordination are intact.  Reflexes are diminished in the lower extremities.  No abnormal tone, clonus or tremors.  Her motor strength is quite good in the left lower extremity, it is difficult to  assess left lower extremity due to pain at the hip.  She has difficulty flexing the hip.  She can flex and extend the knee without difficulty.  Her strength is good, 5/5 with knee flexion and knee extension, dorsiflexion, plantar flexion of the right ankle are 5/5 as well.  She has right knee replacement.  Left lower extremity, she has good range of motion and good strength throughout the lower extremity.  Transitioning from sitting to standing.  Gait is antalgic.  She does use a crutch. Lumbar motion does not aggravate her pain at this time.  Internal and external rotation at the right hip is very painful and aggravate her pain significantly, left hip is without pain with internal and external rotation.  IMPRESSION: 1. Progression of right hip osteoarthritis per recent radiograph of     February 23, 2010. 2. History of left knee osteoarthritis, not an active problem at this     time. 3. History of right knee arthroplasty. 4. Morbid obesity. 5. Decondition.  PLAN:  Refill her pain medications.  Percocet 7.5/325 one p.o. q.i.d. p.r.n. hip pain.  I have encouraged her to continue to participate in water aerobics several times a week if she can.  She will follow up with Dr. Charlann Boxer per his instructions.  She takes her medications as prescribed.  No aberrant behavior observed. We will have nursing staff see her next month for refill of her pain medications.  I will see her back in  2 months.  We will continue to monitor her pill counts and perform random drug screens.     Brantley Stage, M.D. Electronically Signed    DMK/MedQ D:  03/21/2010 12:48:47  T:  03/21/2010 23:12:55  Job #:  [id number]

## 2010-06-02 ENCOUNTER — Encounter: Payer: Medicaid Other | Attending: Neurosurgery | Admitting: Neurosurgery

## 2010-06-07 ENCOUNTER — Inpatient Hospital Stay (HOSPITAL_COMMUNITY): Payer: Medicaid Other

## 2010-06-07 ENCOUNTER — Inpatient Hospital Stay (HOSPITAL_COMMUNITY)
Admission: RE | Admit: 2010-06-07 | Discharge: 2010-06-09 | DRG: 470 | Disposition: A | Discharge status: Home Health | Payer: Medicaid Other | Source: Ambulatory Visit | Attending: Orthopedic Surgery | Admitting: Orthopedic Surgery

## 2010-06-07 DIAGNOSIS — Z01812 Encounter for preprocedural laboratory examination: Secondary | ICD-10-CM

## 2010-06-07 DIAGNOSIS — Z21 Asymptomatic human immunodeficiency virus [HIV] infection status: Secondary | ICD-10-CM | POA: Diagnosis present

## 2010-06-07 DIAGNOSIS — M1610 Unilateral primary osteoarthritis, unspecified hip: Principal | ICD-10-CM | POA: Diagnosis present

## 2010-06-07 DIAGNOSIS — M169 Osteoarthritis of hip, unspecified: Principal | ICD-10-CM | POA: Diagnosis present

## 2010-06-07 DIAGNOSIS — E039 Hypothyroidism, unspecified: Secondary | ICD-10-CM | POA: Diagnosis present

## 2010-06-07 DIAGNOSIS — E669 Obesity, unspecified: Secondary | ICD-10-CM | POA: Diagnosis present

## 2010-06-07 LAB — TYPE AND SCREEN
ABO/RH(D): A POS
Antibody Screen: NEGATIVE

## 2010-06-07 LAB — ABO/RH: ABO/RH(D): A POS

## 2010-06-08 LAB — BASIC METABOLIC PANEL
CO2: 26 mEq/L (ref 19–32)
Calcium: 8.9 mg/dL (ref 8.4–10.5)
GFR calc Af Amer: >60 mL/min (ref >60)
GFR calc non Af Amer: >60 mL/min (ref >60)
Sodium: 132 mEq/L — ABNORMAL LOW (ref 135–145)

## 2010-06-08 LAB — CBC
Hemoglobin: 8.2 g/dL — ABNORMAL LOW (ref 12.0–15.0)
MCHC: 31.7 g/dL (ref 30.0–36.0)
RDW: 15.3 % (ref 11.5–15.5)

## 2010-06-09 LAB — BASIC METABOLIC PANEL
CO2: 28 mEq/L (ref 19–32)
Chloride: 100 mEq/L (ref 96–112)
GFR calc Af Amer: >60 mL/min (ref >60)
Potassium: 3.7 mEq/L (ref 3.5–5.1)
Sodium: 133 mEq/L — ABNORMAL LOW (ref 135–145)

## 2010-06-09 LAB — CBC
Hemoglobin: 8.2 g/dL — ABNORMAL LOW (ref 12.0–15.0)
RBC: 3.06 MIL/uL — ABNORMAL LOW (ref 3.87–5.11)
WBC: 6.7 10*3/uL (ref 4.0–10.5)

## 2010-06-13 NOTE — Op Note (Signed)
NAMEMarland Kelley  HASSET, CHAVIANO NO.:  [id number]  MEDICAL RECORD NO.:  [id number]  LOCATION:  0003                         FACILITY:  [facility]  PHYSICIAN:  Madlyn Frankel. Charlann Boxer, M.D.  DATE OF BIRTH:  14-Sep-1955  DATE OF PROCEDURE:  06/07/2010 DATE OF DISCHARGE:                              OPERATIVE REPORT   PREOPERATIVE DIAGNOSES: 1. Right hip osteonecrosis. 2. Morbid obesity. 3. Human immunodeficiency virus positive.  POSTOPERATIVE DIAGNOSES: 1. Right hip osteonecrosis. 2. Morbid obesity. 3. Human immunodeficiency virus positive.  PROCEDURE:  Right total hip replacement utilizing DePuy component size 52 Pinnacle cup, 36 +4 neutral liner, a size 10 standard Tri-Lock stem with a 36 +1.5 Delta ceramic ball.  SURGEON:  Madlyn Frankel. Charlann Boxer, M.D.  ASSISTANT:  Jaquelyn Bitter. Chabon, P.A.  ANESTHESIA:  General.  BLOOD LOSS:  About 600 cc.  SPECIMEN:  None.  DRAINS:  None.  COMPLICATIONS:  None.  INDICATIONS FOR PROCEDURE:  [name] is a 55 year old female who presented to the office for evaluation of right hip pain.  When she was initially seen, we discussed conservative measures due to her size.  We had hoped to have her lose some weight; however, she had persistent pain, inability to tolerate her current hip situation and wished to proceed with hip surgery.  We reviewed the risks of infection due her size, dislocation, risk of complications regarding component failure, need for revision surgery for any reason.  Consent was obtained for benefit of pain relief.  PROCEDURE IN DETAIL:  The patient was brought to the operative theater. Once adequate anesthesia, preoperative antibiotics, Ancef 2 g administered, she was positioned into the left lateral decubitus position with the right-side up.  The right lower extremity was then prepped and draped in a sterile fashion.  Time-out was performed identifying the patient, planned procedure and the extremity.  Lateral incision  made based over a palpable trochanter.  Sharp dissection was carried down to the iliotibial band and gluteal fascia.  This was incised posteriorly for posterior approach.  The short external rotator was taken down separate from the posterior capsule.  An L-capsulotomy was made preserving the posterior and superior leaflets.  Hip was dislocated.  Neck osteotomy was then made based off trochanteric fossa.  Attention was first directed to femur. Retractors were placed and proximal femur opened and drilled, hand reamed once and irrigated to try to prevent fat emboli.  I began broaching with 0 broach and broached up to a size 10 broach which sat at the level of neck cut.  I did also a little bit neck cut to fit the neck based on the initial cut as a posterior calcar planer.  At this point I packed off the femur and attended to the acetabulum.  Acetabular retractors were placed.  Labrum, foveal tissue debrided and the reaming was uncomplicated, reamed out to 51 reamer.  I chose a 52 cup.  The 52 cup was impacted.  Due to her size, this was not straightforward.  I ended up getting the cup in position related to her normal anatomy. There was push portion of the superior cup exposed superolaterally.  I did try to get as  much of the anteversion into the system as possible limited by her size.  The cup appeared to be seated.  Two screws were placed and I did place a trial.  I went ahead based what I felt was good position with 40 degrees of adduction and at least 20 degrees of forward flexion and seemed based on the position with the final 36 +4 liner.  Hole eliminator was placed and the final liner impacted into position.  At this point, trial reduction was carried out with 10 broach in place, standard neck based off for radiographs and a 36.5 ball.  Leg lengths appeared to be comparable.  There was no evidence of any shuck with extension.  No evidence of impingement with forward flexion,  internal rotation, but particularly in sleep position her range of motion was significantly limited based on her size.  Combined anteversion appeared about 40 degrees.  Given these findings, the trial broach was removed, the final 10 standard stem was chosen.  Following irrigation, the final 10 standard stem was impacted into position fitted at the level where the broach was.  Based on this, my trial reduction 36 +1.5 Delta ceramic ball was chosen, impacted onto clean and dry trunnion.  The hip reduced. We irrigated the hip throughout the case, again this point I did reapproximate the posterior capsule superior leaflet using #1 Vicryl, placed a medium Hemovac drain deep.  The iliotibial band and gluteal fascia were then reapproximated using #1 Vicryl.  The remainder of the wound was closed with 2-0 Vicryl, running 4-0 Monocryl.  The hip was then cleaned, dried and dressed sterilely with Dermabond and Aquacel dressing.  Drain site dressed separately.  She was then brought to recovery room in stable condition tolerating procedure well.     Madlyn Frankel Charlann Boxer, M.D.     MDO/MEDQ  D:  06/07/2010  T:  06/07/2010  Job:  [id number]  Electronically Signed by Durene Romans M.D. on 06/13/2010 09:20:32 AM

## 2010-06-13 NOTE — Discharge Summary (Signed)
  NAMEMarland Kitchen  ARIETTA, EISENSTEIN NO.:  [id number]  MEDICAL RECORD NO.:  [id number]  LOCATION:  1536                         FACILITY:  [facility]  PHYSICIAN:  Madlyn Frankel. Charlann Boxer, M.D.  DATE OF BIRTH:  April 09, 1955  DATE OF ADMISSION:  06/07/2010 DATE OF DISCHARGE:  06/09/2010                              DISCHARGE SUMMARY   ADMITTING DIAGNOSIS:  Right hip osteoarthritis.  DISCHARGE DIAGNOSES: 1. Right hip osteoarthritis. 2. History of human immunodeficiency virus. 3. Hypothyroidism. 4. Obesity.  ADMITTING HISTORY:  [name] is a 55 year old female with progressive discomfort in her right hip.  Radiographs have revealed advanced degenerative changes.  We discussed the risks and benefits of hip replacement surgery in her current situation regarding her medical comorbidities.  She is unable to tolerate her pain and despite the risks outlined, she wished to proceed with hip replacement surgery.  Consent was obtained.  HOSPITAL COURSE:  The patient was admitted for same-day surgery on June 07, 2010.  She underwent an uncomplicated right total hip replacement at [facility].  After routine stay in the recovery room, she was transferred to [address] [place] where she remained for her 2-day hospital stay. On postoperative day #1, she had a Foley catheter.  Her Hemovac drain was removed.  She was placed on a regular diet.  She was seen and evaluated by Physical Therapy and was weightbearing as tolerated.  She was on regular diet tolerating it well.  Postoperative day #1 she was noted to have a hematocrit on day #2 of 25.6.  Her electrolytes remained stable.  On postop day #2, she was ready for discharge.  Her wound remained clean, dry, intact.  DISCHARGE CONDITION:  She is stable at time of discharge.  DISCHARGE INSTRUCTIONS:  She will be discharged home with home health physical therapy.  She will return to see Dr. Durene Romans at [facility] at [phone] in 2 weeks' time . She  can shower, keeping her wound until followup.  She has an Aquacel dressing that will be removed in 8 days from surgery, then she will cover with gauze and tape.  Any orthopedic questions can be addressed through our office.  DISCHARGE MEDICATIONS:  Include: 1. Colace 100 mg p.o. b.i.d. while on pain medicine for constipation. 2. Aspirin 325 mg b.i.d. for 30 days. 3. Iron 325 mg 2 to 3 times a day as tolerated for 2 to 3 weeks. 4. Robaxin 500 mg p.o. q.6 h as needed for muscle spasm and pain. 5. Norco 7.5/325 one to two tablets every 4 to 6 hours as needed for     pain. 6. MiraLax 17 g p.o. daily as needed for constipation while on pain     medicine. 7. Aleve as needed. 8. Levothyroxine 137 mcg q.a.m.  Questions were encouraged and reviewed at time of discharge.     Madlyn Frankel Charlann Boxer, M.D.     MDO/MEDQ  D:  06/09/2010  T:  06/09/2010  Job:  [id number]  Electronically Signed by Durene Romans M.D. on 06/13/2010 09:20:36 AM

## 2010-06-15 ENCOUNTER — Encounter: Payer: Medicaid Other | Admitting: Neurosurgery

## 2010-07-13 ENCOUNTER — Ambulatory Visit: Payer: Medicaid Other | Attending: Orthopedic Surgery | Admitting: Rehabilitative and Restorative Service Providers"

## 2010-07-13 DIAGNOSIS — M25559 Pain in unspecified hip: Secondary | ICD-10-CM | POA: Insufficient documentation

## 2010-07-13 DIAGNOSIS — Z96659 Presence of unspecified artificial knee joint: Secondary | ICD-10-CM | POA: Insufficient documentation

## 2010-07-13 DIAGNOSIS — Z96649 Presence of unspecified artificial hip joint: Secondary | ICD-10-CM | POA: Insufficient documentation

## 2010-07-13 DIAGNOSIS — M25659 Stiffness of unspecified hip, not elsewhere classified: Secondary | ICD-10-CM | POA: Insufficient documentation

## 2010-07-13 DIAGNOSIS — IMO0001 Reserved for inherently not codable concepts without codable children: Secondary | ICD-10-CM | POA: Insufficient documentation

## 2010-07-21 ENCOUNTER — Other Ambulatory Visit: Payer: Medicare Other

## 2010-07-21 DIAGNOSIS — Z79899 Other long term (current) drug therapy: Secondary | ICD-10-CM

## 2010-07-21 DIAGNOSIS — B20 Human immunodeficiency virus [HIV] disease: Secondary | ICD-10-CM

## 2010-07-21 DIAGNOSIS — Z113 Encounter for screening for infections with a predominantly sexual mode of transmission: Secondary | ICD-10-CM

## 2010-07-27 ENCOUNTER — Ambulatory Visit: Payer: Medicaid Other | Admitting: Rehabilitative and Restorative Service Providers"

## 2010-07-28 ENCOUNTER — Other Ambulatory Visit (INDEPENDENT_AMBULATORY_CARE_PROVIDER_SITE_OTHER): Payer: Self-pay

## 2010-07-28 DIAGNOSIS — D649 Anemia, unspecified: Secondary | ICD-10-CM

## 2010-07-28 DIAGNOSIS — B20 Human immunodeficiency virus [HIV] disease: Secondary | ICD-10-CM

## 2010-07-28 DIAGNOSIS — Z113 Encounter for screening for infections with a predominantly sexual mode of transmission: Secondary | ICD-10-CM

## 2010-07-28 DIAGNOSIS — Z79899 Other long term (current) drug therapy: Secondary | ICD-10-CM

## 2010-07-28 LAB — COMPREHENSIVE METABOLIC PANEL
CO2: 24 mEq/L (ref 19–32)
Calcium: 10 mg/dL (ref 8.4–10.5)
Chloride: 101 mEq/L (ref 96–112)
Glucose, Bld: 107 mg/dL — ABNORMAL HIGH (ref 70–99)
Sodium: 137 mEq/L (ref 135–145)
Total Bilirubin: 0.3 mg/dL (ref 0.3–1.2)
Total Protein: 8.8 g/dL — ABNORMAL HIGH (ref 6.0–8.3)

## 2010-07-28 LAB — LIPID PANEL
HDL: 37 mg/dL — ABNORMAL LOW (ref >39)
LDL Cholesterol: 100 mg/dL — ABNORMAL HIGH (ref 0–99)

## 2010-07-29 LAB — CBC WITH DIFFERENTIAL/PLATELET
Eosinophils Absolute: 0.3 10*3/uL (ref 0.0–0.7)
Eosinophils Relative: 5 % (ref 0–5)
Hemoglobin: 10.2 g/dL — ABNORMAL LOW (ref 12.0–15.0)
Lymphocytes Relative: 49 % — ABNORMAL HIGH (ref 12–46)
Lymphs Abs: 3.3 10*3/uL (ref 0.7–4.0)
MCH: 25.6 pg — ABNORMAL LOW (ref 26.0–34.0)
MCV: 83.2 fL (ref 78.0–100.0)
Monocytes Relative: 8 % (ref 3–12)
Neutrophils Relative %: 38 % — ABNORMAL LOW (ref 43–77)
RBC: 3.99 MIL/uL (ref 3.87–5.11)

## 2010-07-29 LAB — T-HELPER CELL (CD4) - (RCID CLINIC ONLY)
CD4 % Helper T Cell: 35 % (ref 33–55)
CD4 T Cell Abs: 1220 uL (ref 400–2700)

## 2010-07-29 LAB — RPR

## 2010-07-29 LAB — HIV-1 RNA QUANT-NO REFLEX-BLD
HIV 1 RNA Quant: 885 copies/mL — ABNORMAL HIGH (ref <20)
HIV-1 RNA Quant, Log: 2.95 {Log} — ABNORMAL HIGH (ref <1.30)

## 2010-08-03 ENCOUNTER — Ambulatory Visit
Payer: Medicaid Other | Attending: Physical Medicine and Rehabilitation | Admitting: Rehabilitative and Restorative Service Providers"

## 2010-08-03 DIAGNOSIS — M25559 Pain in unspecified hip: Secondary | ICD-10-CM | POA: Insufficient documentation

## 2010-08-03 DIAGNOSIS — Z96649 Presence of unspecified artificial hip joint: Secondary | ICD-10-CM | POA: Insufficient documentation

## 2010-08-03 DIAGNOSIS — IMO0001 Reserved for inherently not codable concepts without codable children: Secondary | ICD-10-CM | POA: Insufficient documentation

## 2010-08-03 DIAGNOSIS — M25659 Stiffness of unspecified hip, not elsewhere classified: Secondary | ICD-10-CM | POA: Insufficient documentation

## 2010-08-03 DIAGNOSIS — Z96659 Presence of unspecified artificial knee joint: Secondary | ICD-10-CM | POA: Insufficient documentation

## 2010-08-04 ENCOUNTER — Encounter: Payer: Medicaid Other | Admitting: Rehabilitative and Restorative Service Providers"

## 2010-08-04 ENCOUNTER — Ambulatory Visit: Payer: Medicaid Other | Admitting: Adult Health

## 2010-08-10 ENCOUNTER — Encounter: Payer: Medicaid Other | Admitting: Rehabilitative and Restorative Service Providers"

## 2010-08-10 ENCOUNTER — Ambulatory Visit: Payer: Medicaid Other | Admitting: Adult Health

## 2010-09-30 LAB — URINALYSIS, ROUTINE W REFLEX MICROSCOPIC
Glucose, UA: NEGATIVE
Ketones, ur: NEGATIVE
Nitrite: NEGATIVE
Protein, ur: NEGATIVE
Urobilinogen, UA: 1

## 2010-09-30 LAB — PREGNANCY, URINE: Preg Test, Ur: NEGATIVE

## 2010-10-11 LAB — CBC
MCHC: 32.3
Platelets: 360
RDW: 16 — ABNORMAL HIGH

## 2010-10-11 LAB — POCT I-STAT CREATININE
Creatinine, Ser: 1.2
Operator id: 265201

## 2010-10-11 LAB — POCT CARDIAC MARKERS: Myoglobin, poc: 73.3

## 2010-10-11 LAB — I-STAT 8, (EC8 V) (CONVERTED LAB)
Bicarbonate: 22.1
Glucose, Bld: 94
TCO2: 23
pH, Ven: 7.427 — ABNORMAL HIGH

## 2010-10-11 LAB — DIFFERENTIAL
Basophils Relative: 1
Lymphocytes Relative: 50 — ABNORMAL HIGH
Neutro Abs: 2.5
Neutrophils Relative %: 36 — ABNORMAL LOW

## 2010-12-01 ENCOUNTER — Encounter: Payer: Self-pay | Admitting: *Deleted

## 2010-12-20 ENCOUNTER — Telehealth: Payer: Self-pay | Admitting: *Deleted

## 2010-12-20 NOTE — Telephone Encounter (Signed)
Letter returned as not able to deliver not a good address.

## 2010-12-29 ENCOUNTER — Other Ambulatory Visit: Payer: Self-pay | Admitting: Internal Medicine

## 2010-12-29 DIAGNOSIS — B20 Human immunodeficiency virus [HIV] disease: Secondary | ICD-10-CM

## 2011-01-05 ENCOUNTER — Other Ambulatory Visit: Payer: Medicaid Other

## 2011-01-05 ENCOUNTER — Other Ambulatory Visit: Payer: Self-pay

## 2011-01-06 ENCOUNTER — Other Ambulatory Visit: Payer: Self-pay | Admitting: Infectious Diseases

## 2011-01-06 ENCOUNTER — Other Ambulatory Visit (INDEPENDENT_AMBULATORY_CARE_PROVIDER_SITE_OTHER): Payer: Medicaid Other

## 2011-01-06 DIAGNOSIS — B20 Human immunodeficiency virus [HIV] disease: Secondary | ICD-10-CM

## 2011-01-06 LAB — COMPREHENSIVE METABOLIC PANEL
ALT: 12 U/L (ref 0–35)
Albumin: 3.6 g/dL (ref 3.5–5.2)
CO2: 21 mEq/L (ref 19–32)
Calcium: 9.5 mg/dL (ref 8.4–10.5)
Chloride: 105 mEq/L (ref 96–112)
Creat: 1.01 mg/dL (ref 0.50–1.10)
Potassium: 4.6 mEq/L (ref 3.5–5.3)
Total Protein: 8.1 g/dL (ref 6.0–8.3)

## 2011-01-06 LAB — CBC WITH DIFFERENTIAL/PLATELET
Eosinophils Relative: 5 % (ref 0–5)
HCT: 34.1 % — ABNORMAL LOW (ref 36.0–46.0)
Lymphocytes Relative: 45 % (ref 12–46)
Lymphs Abs: 3.2 10*3/uL (ref 0.7–4.0)
MCV: 80.8 fL (ref 78.0–100.0)
Neutro Abs: 3 10*3/uL (ref 1.7–7.7)
Platelets: 394 10*3/uL (ref 150–400)
RBC: 4.22 MIL/uL (ref 3.87–5.11)
WBC: 7.2 10*3/uL (ref 4.0–10.5)

## 2011-01-06 LAB — T-HELPER CELL (CD4) - (RCID CLINIC ONLY): CD4 T Cell Abs: 1180 uL (ref 400–2700)

## 2011-01-10 LAB — HIV-1 RNA QUANT-NO REFLEX-BLD: HIV-1 RNA Quant, Log: 3.12 {Log} — ABNORMAL HIGH (ref <1.30)

## 2011-01-23 ENCOUNTER — Telehealth: Payer: Self-pay | Admitting: Licensed Clinical Social Worker

## 2011-01-23 ENCOUNTER — Ambulatory Visit: Payer: Self-pay | Admitting: Infectious Disease

## 2011-01-23 NOTE — Telephone Encounter (Signed)
Called patient to reschedule her appointment missed today. She did not answer so I left a message.

## 2011-02-02 ENCOUNTER — Encounter: Payer: Self-pay | Admitting: Infectious Disease

## 2011-02-02 ENCOUNTER — Ambulatory Visit (INDEPENDENT_AMBULATORY_CARE_PROVIDER_SITE_OTHER): Payer: Medicaid Other | Admitting: Infectious Disease

## 2011-02-02 VITALS — BP 157/102 | HR 60 | Temp 97.9°F | Ht 65.0 in | Wt 328.0 lb

## 2011-02-02 DIAGNOSIS — Z21 Asymptomatic human immunodeficiency virus [HIV] infection status: Secondary | ICD-10-CM

## 2011-02-02 DIAGNOSIS — I10 Essential (primary) hypertension: Secondary | ICD-10-CM

## 2011-02-02 DIAGNOSIS — B20 Human immunodeficiency virus [HIV] disease: Secondary | ICD-10-CM

## 2011-02-02 NOTE — Assessment & Plan Note (Signed)
Poorly controlled secondary to noncompliance. She is also esp stressed today.

## 2011-02-02 NOTE — Assessment & Plan Note (Signed)
Patient to me would be ideal candidate for START. WIth her low viral load and high cd4 count we have especial equipoise about starting or not starting ARV with CD4 well above 500. I will have Deirdre Evener get in touch with her re this trial.  Certainly she would also be a candidate for ACTG trials as well. She tells me she would be "sloppy" with ARVs and she understands that PI based regimen would be best under these circumstances. She herself is greatly concerned about ARV and that they will make her skin dark.

## 2011-02-02 NOTE — Progress Notes (Signed)
  Subjective:    Patient ID: Miranda Kelley, female    DOB: [birth year], 56 y.o.   MRN: [id number]  HPI  56 year old [place] American lady with HIV infection and chronic low viremia and high cd4 count well above 1000. She returns to clinic today. In past Dr. Philipp Kelley had deferred this in the past. I spent greater than 60 minutes with the patient including greater than 50% of time in face to face counsel of the patient and in coordination of their care including explanation of nature of HIV, DHHS recommendations, options for her including clinical trial such as START vs ACTG, vs starting therapy. She is not especially ready to start but will consider a trial in particular START. She is not taking her anti-HTNsive meds and she did become tearful during the interview. She has not been sexually active since the dx, Her husband whom I have seen as well in the hospital in HIV infected and was pparently the source for her infection. She is VERY worried about family members finding out about her diagnosis  Miranda Kelley is PCP   Review of Systems  Constitutional: Negative for fever, chills, diaphoresis, activity change, appetite change, fatigue and unexpected weight change.  HENT: Negative for congestion, sore throat, rhinorrhea, sneezing, trouble swallowing and sinus pressure.   Eyes: Negative for photophobia and visual disturbance.  Respiratory: Negative for cough, chest tightness, shortness of breath, wheezing and stridor.   Cardiovascular: Negative for chest pain, palpitations and leg swelling.  Gastrointestinal: Negative for nausea, vomiting, abdominal pain, diarrhea, constipation, blood in stool, abdominal distention and anal bleeding.  Genitourinary: Negative for dysuria, hematuria, flank pain and difficulty urinating.  Musculoskeletal: Negative for myalgias, back pain, joint swelling, arthralgias and gait problem.  Skin: Negative for color change, pallor, rash and wound.  Neurological: Negative for  dizziness, tremors, weakness and light-headedness.  Hematological: Negative for adenopathy. Does not bruise/bleed easily.  Psychiatric/Behavioral: Negative for behavioral problems, confusion, sleep disturbance, dysphoric mood, decreased concentration and agitation.       Objective:   Physical Exam  Constitutional: She is oriented to person, place, and time. She appears well-developed and well-nourished. No distress.  HENT:  Head: Normocephalic and atraumatic.  Mouth/Throat: Oropharynx is clear and moist. No oropharyngeal exudate.  Eyes: Conjunctivae and EOM are normal. Pupils are equal, round, and reactive to light. No scleral icterus.  Neck: Normal range of motion. Neck supple. No JVD present.  Cardiovascular: Normal rate, regular rhythm and normal heart sounds.  Exam reveals no gallop and no friction rub.   No murmur heard. Pulmonary/Chest: Effort normal and breath sounds normal. No respiratory distress. She has no wheezes. She has no rales. She exhibits no tenderness.  Abdominal: She exhibits no distension and no mass. There is no tenderness. There is no rebound and no guarding.  Musculoskeletal: She exhibits no edema and no tenderness.  Lymphadenopathy:    She has no cervical adenopathy.  Neurological: She is alert and oriented to person, place, and time. She has normal reflexes. She exhibits normal muscle tone. Coordination normal.  Skin: Skin is warm and dry. She is not diaphoretic. No erythema. No pallor.  Psychiatric: Her behavior is normal. Judgment and thought content normal. Her mood appears anxious.       tearful          Assessment & Plan:

## 2011-04-10 ENCOUNTER — Emergency Department (INDEPENDENT_AMBULATORY_CARE_PROVIDER_SITE_OTHER)
Admission: EM | Admit: 2011-04-10 | Discharge: 2011-04-10 | Disposition: A | Discharge status: Home / Self Care | Payer: Medicare Other | Source: Home / Self Care | Attending: Emergency Medicine | Admitting: Emergency Medicine

## 2011-04-10 ENCOUNTER — Encounter (HOSPITAL_COMMUNITY): Payer: Self-pay | Admitting: Cardiology

## 2011-04-10 ENCOUNTER — Emergency Department (INDEPENDENT_AMBULATORY_CARE_PROVIDER_SITE_OTHER): Payer: Medicare Other

## 2011-04-10 DIAGNOSIS — R0789 Other chest pain: Secondary | ICD-10-CM

## 2011-04-10 HISTORY — DX: Disorder of thyroid, unspecified: E07.9

## 2011-04-10 HISTORY — DX: Essential (primary) hypertension: I10

## 2011-04-10 HISTORY — DX: Chronic sinusitis, unspecified: J32.9

## 2011-04-10 MED ORDER — NAPROXEN 500 MG PO TABS: 500.0000 mg | ORAL_TABLET | Freq: Two times a day (BID) | ORAL | Status: DC

## 2011-04-10 NOTE — ED Provider Notes (Signed)
History     CSN: [id number]  Arrival date & time 04/10/11  4098   First MD Initiated Contact with Patient 04/10/11 1935      Chief Complaint  Patient presents with  . Chest Pain  . Nasal Congestion    (Consider location/radiation/quality/duration/timing/severity/associated sxs/prior treatment) HPI Comments: Miranda Kelley presents for evaluation of left-sided upper chest pain. She describes the pain as sharp at times, but was dull other times and was quite severe last night. Upon further interview, she reports that the pain has been off and on for more than one year. She has not seen any medical provider for this yet. She states it occurs mostly at rest, is not exacerbated with exertion, and resolve spontaneously. She states that she with reports to care tonight because it is beginning to increase in frequency and intensity. She also reports a remote diagnosis of hypertension, was "given some pills" that she did not take, because she thought her blood pressure elevation was do to stress. She denies any known cardiovascular disease, or diabetes. She has not had recurrent primary care provider. Her medical history significant for thyroid disease and a right total hip replacement and right total knee replacement. She denies any shortness of breath, diaphoresis, numbness, tingling, weakness, or vision changes. She denies any cough, or fever. She does not smoke tobacco. However it did interview, she reports a remote history of substance abuse where she smoked crack. However she states that she's been clean for some time now.  Patient is a 56 y.o. female presenting with chest pain. The history is provided by the patient.  Chest Pain The chest pain began more than 1 year ago. Chest pain occurs constantly. The chest pain is unchanged. The quality of the pain is described as dull, sharp and heavy. The pain does not radiate. Pertinent negatives for primary symptoms include no fever, no fatigue, no shortness of  breath, no cough, no wheezing, no palpitations, no abdominal pain, no nausea, no vomiting and no dizziness.  Pertinent negatives for associated symptoms include no claudication, no diaphoresis, no orthopnea and no weakness.     Past Medical History  Diagnosis Date  . Hypertension   . Thyroid disease   . Chronic sinus infection     Past Surgical History  Procedure Date  . Joint replacement 06/2009    right    No family history on file.  History  Substance Use Topics  . Smoking status: Never Smoker   . Smokeless tobacco: Not on file  . Alcohol Use: Yes     occas    OB History    Grav Para Term Preterm Abortions TAB SAB Ect Mult Living                  Review of Systems  Constitutional: Negative.  Negative for fever, diaphoresis and fatigue.  HENT: Negative.   Eyes: Negative.   Respiratory: Negative.  Negative for cough, shortness of breath and wheezing.   Cardiovascular: Positive for chest pain. Negative for palpitations, orthopnea and claudication.  Gastrointestinal: Negative.  Negative for nausea, vomiting and abdominal pain.  Genitourinary: Negative.   Musculoskeletal: Negative.   Skin: Negative.   Neurological: Negative.  Negative for dizziness and weakness.    Allergies  Review of patient's allergies indicates no known allergies.  Home Medications   Current Outpatient Rx  Name Route Sig Dispense Refill  . LEVOTHYROXINE SODIUM 150 MCG PO TABS Oral Take 150 mcg by mouth daily.      [name]  TRIAMCINOLONE ACETONIDE 0.1 % EX LOTN Topical Apply topically 2 (two) times daily.      [name] FERROUS SULFATE 325 (65 FE) MG PO TABS Oral Take 325 mg by mouth daily.      [name] FEXOFENADINE HCL 180 MG PO TABS Oral Take 180 mg by mouth daily.      [name] HYDROCHLOROTHIAZIDE 25 MG PO TABS Oral Take 25 mg by mouth daily.      [name] LANSOPRAZOLE 30 MG PO CPDR Oral Take 30 mg by mouth daily.      [name] NAPROXEN 500 MG PO TABS Oral Take 1 tablet (500 mg total) by mouth 2 (two) times daily. 30 tablet 0    . OXYCODONE-ACETAMINOPHEN 7.5-500 MG PO TABS Oral Take 1 tablet by mouth every 8 (eight) hours as needed.        BP 136/86  Pulse 79  Temp(Src) 98.4 F (36.9 C) (Oral)  Resp 18  SpO2 100%  LMP 02/10/2011  Physical Exam  Nursing note and vitals reviewed. Constitutional: She is oriented to person, place, and time. She appears well-developed and well-nourished.  HENT:  Head: Normocephalic and atraumatic.  Right Ear: Tympanic membrane normal.  Left Ear: Tympanic membrane normal.  Mouth/Throat: Uvula is midline, oropharynx is clear and moist and mucous membranes are normal.  Eyes: EOM are normal.  Neck: Normal range of motion.  Cardiovascular: Normal rate, regular rhythm, S1 normal, S2 normal and normal heart sounds.   No murmur heard.      ECG: NSR, rate 77, no ST or T wave changes; no acute findings or changes compared with 2010 ECG other than PVCs noted on that ECG   Pulmonary/Chest: Effort normal and breath sounds normal. She has no decreased breath sounds. She has no wheezes. She has no rhonchi.       CXR: negative  Abdominal: Soft. Normal appearance and bowel sounds are normal. There is no tenderness.  Musculoskeletal: Normal range of motion.  Neurological: She is alert and oriented to person, place, and time.  Skin: Skin is warm and dry.  Psychiatric: Her behavior is normal.    ED Course  Procedures (including critical care time)  Labs Reviewed - No data to display Dg Chest 2 View  04/10/2011  *RADIOLOGY REPORT*  Clinical Data: Chest pain, congestion and  CHEST - 2 VIEW  Comparison:  11/14/2006  Findings:  The heart size and mediastinal contours are within normal limits.  Both lungs are clear.  The visualized skeletal structures are unremarkable.  IMPRESSION: No active cardiopulmonary disease.  Original Report Authenticated By: Judie Petit. Ruel Favors, M.D.     1. Atypical chest pain       MDM  CXR: reviewed by radiologist and myself; no acute findings; ECG showed no acute  changes when compared to ECG from 2010; given the length of time of complaints, lack of overt risk factors, no known coronary disease, acceptable vital signs, and unremarkable physical exam, I feel there is low suspicion for cardiac etiology of pain. In addition, given a negative chest x-ray findings low suspicion for for cardiopulmonary origins. However this is the evaluation does not completely rule out such causes; I recommend that she followup establish care with a primary care provider for further workup as an outpatient. ECG and chest x-ray are reassuring at this time. However suspicion still exists for some sort of vascular origin such as thrombophlebitis. We'll prescribed high-dose naproxen at this time.         Renaee Munda, MD 04/10/11  2205 

## 2011-04-10 NOTE — ED Notes (Signed)
Pt reports left chest and shoulder pain that started a year or two ago but worse lately. Pt states pain happens when she is at rest and just hurts all the time. The pt states she take 5 motrin tablet daily.  Pt reports chest pain to be a tightness that started yesterday worse in the PM and is now tender touch today and she is unable to lay on her left side. Pt also reports nasal congestion. Pt noted to have scattered wheezes to left lung fields that clear with coughing.  During assessment pt states she was prescribed medication years ago for her blood pressure but has never taken them. Occasional SBP in the 190's and attributes that to stress.

## 2011-04-10 NOTE — Discharge Instructions (Signed)
Your examination, and history, including EKG and chest x-ray were unremarkable with low suspicion for cardiac origin of your pain. However, this does not completely rule out all causes. Therefore I do not know what is exactly causing your pain so you need to further evaluation. Please call [facility] and establish care with a primary care physician for further evaluation of your pain. However, should her pain worsen in any way, or new symptoms arise such as shortness of breath, sweating, vision changes, numbness, tingling, or weakness, please present to the nearest emergency department or call 911. Based on my findings, I feel that there may be an inflammatory component to your pain. Please take medications as directed, and apply warm compresses as tolerated.

## 2011-06-03 ENCOUNTER — Inpatient Hospital Stay (HOSPITAL_COMMUNITY)
Admission: EM | Admit: 2011-06-03 | Discharge: 2011-06-06 | DRG: 418 | Disposition: A | Discharge status: Home / Self Care | Payer: Medicare Other | Attending: General Surgery | Admitting: General Surgery

## 2011-06-03 DIAGNOSIS — K8020 Calculus of gallbladder without cholecystitis without obstruction: Secondary | ICD-10-CM

## 2011-06-03 DIAGNOSIS — K8010 Calculus of gallbladder with chronic cholecystitis without obstruction: Principal | ICD-10-CM | POA: Diagnosis present

## 2011-06-03 DIAGNOSIS — Z6841 Body Mass Index (BMI) 40.0 and over, adult: Secondary | ICD-10-CM

## 2011-06-03 DIAGNOSIS — E039 Hypothyroidism, unspecified: Secondary | ICD-10-CM | POA: Diagnosis present

## 2011-06-03 DIAGNOSIS — I10 Essential (primary) hypertension: Secondary | ICD-10-CM | POA: Diagnosis present

## 2011-06-03 DIAGNOSIS — Z21 Asymptomatic human immunodeficiency virus [HIV] infection status: Secondary | ICD-10-CM | POA: Diagnosis present

## 2011-06-03 NOTE — ED Notes (Signed)
Pt c/o RUQ abdominal pain x 1 week, worsening. Has not seen MD about this pain. Experienced similar prior to this episode which resolved w/o intervention x 3-4 months ago.

## 2011-06-04 ENCOUNTER — Emergency Department (HOSPITAL_COMMUNITY): Payer: Medicare Other

## 2011-06-04 ENCOUNTER — Encounter (HOSPITAL_COMMUNITY): Payer: Self-pay | Admitting: Emergency Medicine

## 2011-06-04 DIAGNOSIS — K8010 Calculus of gallbladder with chronic cholecystitis without obstruction: Secondary | ICD-10-CM

## 2011-06-04 DIAGNOSIS — K8020 Calculus of gallbladder without cholecystitis without obstruction: Secondary | ICD-10-CM

## 2011-06-04 LAB — COMPREHENSIVE METABOLIC PANEL
ALT: 15 U/L (ref 0–35)
AST: 18 U/L (ref 0–37)
CO2: 26 mEq/L (ref 19–32)
Calcium: 9.7 mg/dL (ref 8.4–10.5)
Creatinine, Ser: 0.98 mg/dL (ref 0.50–1.10)
GFR calc Af Amer: 74 mL/min — ABNORMAL LOW (ref >90)
GFR calc non Af Amer: 64 mL/min — ABNORMAL LOW (ref >90)
Glucose, Bld: 100 mg/dL — ABNORMAL HIGH (ref 70–99)
Sodium: 137 mEq/L (ref 135–145)
Total Protein: 8.6 g/dL — ABNORMAL HIGH (ref 6.0–8.3)

## 2011-06-04 LAB — URINALYSIS, ROUTINE W REFLEX MICROSCOPIC
Bilirubin Urine: NEGATIVE
Glucose, UA: NEGATIVE mg/dL
Hgb urine dipstick: NEGATIVE
Ketones, ur: NEGATIVE mg/dL
Protein, ur: NEGATIVE mg/dL
Urobilinogen, UA: 0.2 mg/dL (ref 0.0–1.0)

## 2011-06-04 LAB — CBC
HCT: 34.3 % — ABNORMAL LOW (ref 36.0–46.0)
MCH: 25.4 pg — ABNORMAL LOW (ref 26.0–34.0)
MCV: 82.9 fL (ref 78.0–100.0)
Platelets: 328 10*3/uL (ref 150–400)
RDW: 16 % — ABNORMAL HIGH (ref 11.5–15.5)

## 2011-06-04 LAB — DIFFERENTIAL
Basophils Absolute: 0 10*3/uL (ref 0.0–0.1)
Eosinophils Absolute: 0.3 10*3/uL (ref 0.0–0.7)
Eosinophils Relative: 5 % (ref 0–5)
Monocytes Absolute: 0.5 10*3/uL (ref 0.1–1.0)

## 2011-06-04 MED ORDER — DIPHENHYDRAMINE HCL 25 MG PO TABS
25.0000 mg | ORAL_TABLET | Freq: Four times a day (QID) | ORAL | Status: DC | PRN
  Administered 2011-06-05 (×2): 25 mg via ORAL
  Filled 2011-06-04 (×3): qty 1

## 2011-06-04 MED ORDER — PANTOPRAZOLE SODIUM 40 MG IV SOLR
40.0000 mg | Freq: Every day | INTRAVENOUS | Status: DC
  Administered 2011-06-04 – 2011-06-05 (×2): 40 mg via INTRAVENOUS
  Filled 2011-06-04 (×3): qty 40

## 2011-06-04 MED ORDER — LEVOTHYROXINE SODIUM 150 MCG PO TABS
150.0000 ug | ORAL_TABLET | Freq: Every day | ORAL | Status: DC
  Administered 2011-06-05 – 2011-06-06 (×2): 150 ug via ORAL
  Filled 2011-06-04 (×3): qty 1

## 2011-06-04 MED ORDER — HYDROMORPHONE HCL PF 1 MG/ML IJ SOLN
1.0000 mg | INTRAMUSCULAR | Status: DC | PRN
  Administered 2011-06-04 – 2011-06-06 (×4): 1 mg via INTRAVENOUS
  Filled 2011-06-04 (×4): qty 1

## 2011-06-04 MED ORDER — POTASSIUM CHLORIDE IN NACL 20-0.9 MEQ/L-% IV SOLN
INTRAVENOUS | Status: DC
  Administered 2011-06-04 – 2011-06-05 (×2): via INTRAVENOUS
  Filled 2011-06-04 (×4): qty 1000

## 2011-06-04 MED ORDER — MAGNESIUM CITRATE PO SOLN
1.0000 | Freq: Once | ORAL | Status: AC
  Administered 2011-06-04: 1 via ORAL
  Filled 2011-06-04: qty 296

## 2011-06-04 MED ORDER — ONDANSETRON HCL 4 MG/2ML IJ SOLN
4.0000 mg | Freq: Once | INTRAMUSCULAR | Status: AC
  Administered 2011-06-04: 4 mg via INTRAVENOUS
  Filled 2011-06-04: qty 2

## 2011-06-04 MED ORDER — OXYCODONE HCL 5 MG PO TABS
5.0000 mg | ORAL_TABLET | ORAL | Status: DC | PRN
  Administered 2011-06-06: 5 mg via ORAL
  Filled 2011-06-04: qty 1

## 2011-06-04 MED ORDER — MORPHINE SULFATE 4 MG/ML IJ SOLN
6.0000 mg | Freq: Once | INTRAMUSCULAR | Status: AC
  Administered 2011-06-04: 6 mg via INTRAVENOUS
  Filled 2011-06-04: qty 2

## 2011-06-04 MED ORDER — DEXTROSE 5 % IV SOLN
2.0000 g | Freq: Four times a day (QID) | INTRAVENOUS | Status: DC
  Administered 2011-06-04 – 2011-06-05 (×4): 2 g via INTRAVENOUS
  Filled 2011-06-04 (×6): qty 2

## 2011-06-04 MED ORDER — ONDANSETRON HCL 4 MG/2ML IJ SOLN
4.0000 mg | Freq: Four times a day (QID) | INTRAMUSCULAR | Status: DC | PRN
  Administered 2011-06-04 – 2011-06-05 (×2): 4 mg via INTRAVENOUS
  Filled 2011-06-04 (×2): qty 2

## 2011-06-04 MED ORDER — ENOXAPARIN SODIUM 40 MG/0.4ML ~~LOC~~ SOLN
40.0000 mg | SUBCUTANEOUS | Status: DC
  Administered 2011-06-04 – 2011-06-05 (×2): 40 mg via SUBCUTANEOUS
  Filled 2011-06-04 (×3): qty 0.4

## 2011-06-04 NOTE — ED Provider Notes (Addendum)
Obese female relates for the past week she's been having some right-sided abdominal pain and sometimes radiates to her back. She denies any association to food, she denies nausea or vomiting. She states certain movements seem to make the pain worse.   07:20 Dr Magnus Ivan will come see patient in the ED.   Medical screening examination/treatment/procedure(s) were conducted as a shared visit with non-physician practitioner(s) and myself.  I personally evaluated the patient during the encounter Devoria Albe, MD, Franz Dell, MD 06/04/11 [id number]  Ward Givens, MD 06/04/11 [id number]

## 2011-06-04 NOTE — ED Provider Notes (Signed)
History     CSN: [id number]  Arrival date & time 06/03/11  2328   First MD Initiated Contact with Patient 06/04/11 0135      Chief Complaint  Patient presents with  . Abdominal Pain    x 1 week    (Consider location/radiation/quality/duration/timing/severity/associated sxs/prior treatment) HPI History from patient. 56 year old female who presents with right side pain for the past week. Pain is described as aching in his located primarily in the left side, occasionally radiating to the lower right abdomen and into her back. Pain gets worse with movement and improves with rest. It is also improved by putting pressure or lying on the affected side. She has not had any nausea, vomiting, diarrhea, or changes in appetite. She denies any dysuria, hematuria, or frequency. States that she has not had a normal bowel movement in several days. She had a very small movement yesterday which was not normal for her. She has been taking Percocet for the pain at home. She has never had anything like this before. Her only abdominal surgery was a tubal ligation.  Past Medical History  Diagnosis Date  . Hypertension   . Thyroid disease   . Chronic sinus infection     Past Surgical History  Procedure Date  . 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 No pertinent family history.  History  Substance Use Topics  . Smoking status: Never Smoker   . Smokeless tobacco: Not on file  . Alcohol Use: Yes     occas    OB History    Grav Para Term Preterm Abortions TAB SAB Ect Mult Living                  Review of Systems  Constitutional: Negative for fever, chills, activity change and appetite change.  Respiratory: Negative for cough, chest tightness and shortness of breath.   Cardiovascular: Negative for chest pain and palpitations.  Gastrointestinal: Positive for abdominal pain and constipation. Negative for nausea, vomiting, diarrhea, blood in stool, abdominal distention and anal  bleeding.  Genitourinary: Positive for flank pain. Negative for dysuria, hematuria, menstrual problem and pelvic pain.  All other systems reviewed and are negative.    Allergies  Review of patient's allergies indicates no known allergies.  Home Medications   Current Outpatient Rx  Name Route Sig Dispense Refill  . DIPHENHYDRAMINE HCL 25 MG PO TABS Oral Take 25 mg by mouth every 6 (six) hours as needed. Allergies    . FEXOFENADINE HCL 180 MG PO TABS Oral Take 180 mg by mouth daily.      [name] LEVOTHYROXINE SODIUM 150 MCG PO TABS Oral Take 150 mcg by mouth daily.      [name] NAPROXEN SODIUM 220 MG PO TABS Oral Take 220 mg by mouth 2 (two) times daily with a meal.    . OXYCODONE-ACETAMINOPHEN 7.5-500 MG PO TABS Oral Take 1 tablet by mouth every 8 (eight) hours as needed.     . TRIAMCINOLONE ACETONIDE 0.5 % EX OINT Topical Apply topically 2 (two) times daily. Itching .      BP 130/79  Pulse 66  Temp(Src) 98 F (36.7 C) (Oral)  Resp 16  SpO2 100%  LMP 04/03/2011  Physical Exam  Nursing note and vitals reviewed. Constitutional: She appears well-developed and well-nourished. No distress.  HENT:  Head: Normocephalic and atraumatic.  Right Ear: External ear normal.  Left Ear: External ear normal.  Mouth/Throat: Oropharynx is clear and moist. No oropharyngeal  exudate.  Eyes: Conjunctivae and EOM are normal.  Neck: Normal range of motion.  Cardiovascular: Normal rate, regular rhythm and normal heart sounds.   Pulmonary/Chest: Effort normal and breath sounds normal. She exhibits no tenderness.  Abdominal: Soft. Bowel sounds are normal.         Mildly tender to palpation to the right lower quadrant, to the right side, and to the right flank. Pain is most notable in the right side. Abdomen obese.  Musculoskeletal: Normal range of motion.  Neurological: She is alert.  Skin: Skin is warm and dry. She is not diaphoretic.  Psychiatric: She has a normal mood and affect.    ED Course    Procedures (including critical care time)  Labs Reviewed  URINALYSIS, ROUTINE W REFLEX MICROSCOPIC - Abnormal; Notable for the following:    Specific Gravity, Urine 1.031 (*)    All other components within normal limits  CBC - Abnormal; Notable for the following:    Hemoglobin 10.5 (*)    HCT 34.3 (*)    MCH 25.4 (*)    RDW 16.0 (*)    All other components within normal limits  DIFFERENTIAL - Abnormal; Notable for the following:    Neutrophils Relative 34 (*)    Lymphocytes Relative 51 (*)    All other components within normal limits  COMPREHENSIVE METABOLIC PANEL - Abnormal; Notable for the following:    Glucose, Bld 100 (*)    Total Protein 8.6 (*)    Total Bilirubin 0.2 (*)    GFR calc non Af Amer 64 (*)    GFR calc Af Amer 74 (*)    All other components within normal limits  LIPASE, BLOOD - Abnormal; Notable for the following:    Lipase 60 (*)    All other components within normal limits   Ct Abdomen Pelvis Wo Contrast  06/04/2011  *RADIOLOGY REPORT*  Clinical Data: Abdominal pain and right lower flank pain for 1 week.  The  CT ABDOMEN AND PELVIS WITHOUT CONTRAST  Technique:  Multidetector CT imaging of the abdomen and pelvis was performed following the standard protocol without intravenous contrast.  Comparison: None.  Findings: Slight fibrosis in the lung bases.  The kidneys appear symmetrical.  No pyelocaliectasis or ureterectasis.  No renal, ureteral, or bladder stones.  Cholelithiasis with contracted gallbladder.  No inflammatory changes are appreciated.  The unenhanced appearance of the liver, spleen, pancreas, adrenal glands, abdominal aorta, and retroperitoneal lymph nodes is unremarkable.  The stomach, small bowel, and colon are not abnormally distended.  Diffusely stool filled colon.  No free air or free fluid in the abdomen.  Pelvis:  Visualization of the low pelvis is limited due to streak artifact from a right hip arthroplasty.  The uterus and adnexal structures do not  appear enlarged in the bladder wall does not appear thickened.  No free or loculated pelvic fluid collections. No evidence of diverticulitis.  There is segmental visualization of the appendix in the visualized portions appear normal.  IMPRESSION: Cholelithiasis with contracted gallbladder.  No renal or ureteral stone or obstruction.  Original Report Authenticated By: Marlon Pel, M.D.   Dg Abd Acute W/chest  06/04/2011  *RADIOLOGY REPORT*  Clinical Data: Right abdominal pain.  Constipation.  ACUTE ABDOMEN SERIES (ABDOMEN 2 VIEW & CHEST 1 VIEW)  Comparison: Chest dated 04/10/2011.  Findings: Poor inspiration.  Grossly normal sized heart.  Tortuous aorta.  Clear lungs.  Normal bowel gas pattern without free peritoneal air.  Mildly prominent stool.  Right total hip prosthesis.  Mild lumbar spine degenerative changes.  IMPRESSION: No acute abnormality.  Mildly prominent stool.  Original Report Authenticated By: Darrol Angel, M.D.     No diagnosis found. 1) cholelithiasis   MDM  Pt with right side/flank pain x 1 week. On exam, tender to palpation to RLQ but not tender to RUQ. Pt initially stated that she had not had a bowel movement in several days so acute abd series was initially performed which showed mild stool burden. Pt's urine is heme negative and negative for infx. Based on her tenderness, we elected to obtain CT stone study, which shows no evidence of renal pathology but does show cholelithiasis. Pt's labs generally unremarkable (normal LFTs, mildly elevated lipase). Pt's [place] with contracted GB with multiple stones but pos Murphy's - nonspecific finding. Dr. Lynelle Doctor has d/w surgery who will consult.   Pt signed out to Dr. Juleen China at 7:30 AM.     Grant Fontana, PA 06/04/11 [phone]

## 2011-06-04 NOTE — ED Notes (Signed)
Patient transported to X-ray 

## 2011-06-04 NOTE — ED Notes (Signed)
Denies N/V/D and fever- urinary frequency earlier in the week but better now- Rt sided pain radiating to the rt groin x 1 week.

## 2011-06-04 NOTE — ED Provider Notes (Signed)
See prior note   Ward Givens, MD 06/04/11 [phone]

## 2011-06-04 NOTE — H&P (Signed)
Miranda Kelley is an 56 y.o. female.   Chief Complaint: right upper quadrant abdominal pain HPI: this is a morbidly obese 56 year old female who presents with a one-week history of right upper quadrant abdominal pain. This started after fatty meals but now occurs about anything she eats. She has had no nausea or vomiting. Bowel movements are normal. By chart review, she is HIV positive. She denies jaundice. She is uncertain if she has had prior attacks. The pain is described as sharp, continuous, and moderate in intensity. Unfortunately, when I came to see her in the emergency department, she was eating breakfast and drinking coffee. She has no other complaints.  Past Medical History  Diagnosis Date  . Hypertension   . Thyroid disease   . Chronic sinus infection     Past Surgical History  Procedure Date  . 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 No pertinent family history. Social History:  reports that she has never smoked. She does not have any smokeless tobacco history on file. She reports that she drinks alcohol. She reports that she does not use illicit drugs.  Allergies: No Known Allergies   (Not in a hospital admission)  Results for orders placed during the hospital encounter of 06/03/11 (from the past 48 hour(s))  URINALYSIS, ROUTINE W REFLEX MICROSCOPIC     Status: Abnormal   Collection Time   06/04/11  2:01 AM      Component Value Range Comment   Color, Urine YELLOW  YELLOW     APPearance CLEAR  CLEAR     Specific Gravity, Urine 1.031 (*) 1.005 - 1.030     pH 5.5  5.0 - 8.0     Glucose, UA NEGATIVE  NEGATIVE (mg/dL)    Hgb urine dipstick NEGATIVE  NEGATIVE     Bilirubin Urine NEGATIVE  NEGATIVE     Ketones, ur NEGATIVE  NEGATIVE (mg/dL)    Protein, ur NEGATIVE  NEGATIVE (mg/dL)    Urobilinogen, UA 0.2  0.0 - 1.0 (mg/dL)    Nitrite NEGATIVE  NEGATIVE     Leukocytes, UA NEGATIVE  NEGATIVE  MICROSCOPIC NOT DONE ON URINES WITH NEGATIVE PROTEIN, BLOOD,  LEUKOCYTES, NITRITE, OR GLUCOSE <1000 mg/dL.  CBC     Status: Abnormal   Collection Time   06/04/11  5:18 AM      Component Value Range Comment   WBC 5.8  4.0 - 10.5 (K/uL)    RBC 4.14  3.87 - 5.11 (MIL/uL)    Hemoglobin 10.5 (*) 12.0 - 15.0 (g/dL)    HCT 16.1 (*) 09.6 - 46.0 (%)    MCV 82.9  78.0 - 100.0 (fL)    MCH 25.4 (*) 26.0 - 34.0 (pg)    MCHC 30.6  30.0 - 36.0 (g/dL)    RDW 04.5 (*) 40.9 - 15.5 (%)    Platelets 328  150 - 400 (K/uL)   DIFFERENTIAL     Status: Abnormal   Collection Time   06/04/11  5:18 AM      Component Value Range Comment   Neutrophils Relative 34 (*) 43 - 77 (%)    Neutro Abs 2.0  1.7 - 7.7 (K/uL)    Lymphocytes Relative 51 (*) 12 - 46 (%)    Lymphs Abs 3.0  0.7 - 4.0 (K/uL)    Monocytes Relative 9  3 - 12 (%)    Monocytes Absolute 0.5  0.1 - 1.0 (K/uL)    Eosinophils Relative 5  0 -  5 (%)    Eosinophils Absolute 0.3  0.0 - 0.7 (K/uL)    Basophils Relative 1  0 - 1 (%)    Basophils Absolute 0.0  0.0 - 0.1 (K/uL)   COMPREHENSIVE METABOLIC PANEL     Status: Abnormal   Collection Time   06/04/11  5:18 AM      Component Value Range Comment   Sodium 137  135 - 145 (mEq/L)    Potassium 4.2  3.5 - 5.1 (mEq/L)    Chloride 104  96 - 112 (mEq/L)    CO2 26  19 - 32 (mEq/L)    Glucose, Bld 100 (*) 70 - 99 (mg/dL)    BUN 19  6 - 23 (mg/dL)    Creatinine, Ser 8.11  0.50 - 1.10 (mg/dL)    Calcium 9.7  8.4 - 10.5 (mg/dL)    Total Protein 8.6 (*) 6.0 - 8.3 (g/dL)    Albumin 3.7  3.5 - 5.2 (g/dL)    AST 18  0 - 37 (U/L)    ALT 15  0 - 35 (U/L)    Alkaline Phosphatase 110  39 - 117 (U/L)    Total Bilirubin 0.2 (*) 0.3 - 1.2 (mg/dL)    GFR calc non Af Amer 64 (*) >90 (mL/min)    GFR calc Af Amer 74 (*) >90 (mL/min)   LIPASE, BLOOD     Status: Abnormal   Collection Time   06/04/11  5:18 AM      Component Value Range Comment   Lipase 60 (*) 11 - 59 (U/L)    Ct Abdomen Pelvis Wo Contrast  06/04/2011  *RADIOLOGY REPORT*  Clinical Data: Abdominal pain and right lower  flank pain for 1 week.  The  CT ABDOMEN AND PELVIS WITHOUT CONTRAST  Technique:  Multidetector CT imaging of the abdomen and pelvis was performed following the standard protocol without intravenous contrast.  Comparison: None.  Findings: Slight fibrosis in the lung bases.  The kidneys appear symmetrical.  No pyelocaliectasis or ureterectasis.  No renal, ureteral, or bladder stones.  Cholelithiasis with contracted gallbladder.  No inflammatory changes are appreciated.  The unenhanced appearance of the liver, spleen, pancreas, adrenal glands, abdominal aorta, and retroperitoneal lymph nodes is unremarkable.  The stomach, small bowel, and colon are not abnormally distended.  Diffusely stool filled colon.  No free air or free fluid in the abdomen.  Pelvis:  Visualization of the low pelvis is limited due to streak artifact from a right hip arthroplasty.  The uterus and adnexal structures do not appear enlarged in the bladder wall does not appear thickened.  No free or loculated pelvic fluid collections. No evidence of diverticulitis.  There is segmental visualization of the appendix in the visualized portions appear normal.  IMPRESSION: Cholelithiasis with contracted gallbladder.  No renal or ureteral stone or obstruction.  Original Report Authenticated By: Marlon Pel, M.D.   Dg Abd Acute W/chest  06/04/2011  *RADIOLOGY REPORT*  Clinical Data: Right abdominal pain.  Constipation.  ACUTE ABDOMEN SERIES (ABDOMEN 2 VIEW & CHEST 1 VIEW)  Comparison: Chest dated 04/10/2011.  Findings: Poor inspiration.  Grossly normal sized heart.  Tortuous aorta.  Clear lungs.  Normal bowel gas pattern without free peritoneal air.  Mildly prominent stool.  Right total hip prosthesis.  Mild lumbar spine degenerative changes.  IMPRESSION: No acute abnormality.  Mildly prominent stool.  Original Report Authenticated By: Darrol Angel, M.D.   US Abdomen Limited Ruq  06/04/2011  *RADIOLOGY REPORT*  Clinical Data:  Cholelithiasis on  CT.  LIMITED ABDOMINAL ULTRASOUND - RIGHT UPPER QUADRANT  Comparison:  CT abdomen and pelvis 06/04/2011  Findings:  Gallbladder:  The gallbladder is contracted and filled with multiple shadowing stones.  Murphy's sign is positive.  The appearance is nonspecific but can be associated with acute cholecystitis in the appropriate clinical setting.  Common bile duct:  Normal caliber bile ducts with diameter measured at about 5 mm.  Liver:  Increased and coarsened liver parenchymal echotexture pattern which might suggest fatty infiltration or cirrhosis.  IMPRESSION: Contracted stone filled gallbladder.  Positive Murphy's sign.  The appearance is nonspecific but can be associated with acute cholecystitis in the appropriate clinical setting.                   Original Report Authenticated By: Marlon Pel, M.D.    Review of Systems  Constitutional: Negative.   HENT: Negative.   Eyes: Negative.   Respiratory: Negative.  Negative for shortness of breath.   Cardiovascular: Negative.  Negative for chest pain.  Gastrointestinal: Positive for abdominal pain. Negative for nausea and vomiting.  Genitourinary: Negative.   Musculoskeletal: Positive for joint pain.  Skin: Negative.   Neurological: Negative.   Endo/Heme/Allergies: Negative.   Psychiatric/Behavioral: Negative.     Blood pressure 165/88, pulse 81, temperature 98 F (36.7 C), temperature source Oral, resp. rate 18, last menstrual period 04/03/2011, SpO2 98.00%. Physical Exam  Constitutional: She is oriented to person, place, and time. She appears well-developed. No distress.       Morbidly obese female in no acute distress  HENT:  Head: Normocephalic and atraumatic.  Right Ear: External ear normal.  Left Ear: External ear normal.  Nose: Nose normal.  Mouth/Throat: Oropharynx is clear and moist. No oropharyngeal exudate.  Eyes: Conjunctivae are normal. Pupils are equal, round, and reactive to light. Right eye exhibits no discharge. Left  eye exhibits no discharge. No scleral icterus.  Neck: Normal range of motion. Neck supple. No tracheal deviation present. No thyromegaly present.  Cardiovascular: Normal rate, regular rhythm, normal heart sounds and intact distal pulses.   No murmur heard. Respiratory: Effort normal and breath sounds normal. No respiratory distress. She has no wheezes. She has no rales.  GI: Soft. Bowel sounds are normal. She exhibits no distension and no mass. There is tenderness. There is guarding. There is no rebound.       Morbidly obese abdomen.  Mild tenderness with guarding in the right upper quadrant  Musculoskeletal: Normal range of motion. She exhibits no edema and no tenderness.  Lymphadenopathy:    She has no cervical adenopathy.  Neurological: She is alert and oriented to person, place, and time.  Skin: Skin is warm and dry. No rash noted. She is not diaphoretic. No erythema.  Psychiatric: Her behavior is normal. Judgment normal.     Assessment/Plan Symptomatic cholelithiasis  Given her tenderness and discomfort, the plan will be to admit her for laparoscopic cholecystectomy. Unfortunately, she was eating a full breakfast in the emergency department, so I cannot proceed with surgery today. I wanted her to the hospital with plans for potential laparoscopic cholecystectomy tomorrow. Will repeat her laboratory data tomorrow. Again, I explained to her that she will be n.p.o. After midnight so she is not to eat in the morning.  Shar Paez A 06/04/2011, 7:45 AM

## 2011-06-04 NOTE — ED Notes (Signed)
Pt c/o abdominal and right lower flank pain x1 week and states it has become worse over past couple days. Pt denies NVD. Last BM was "a couple days ago". Pt denies any changes in urinary symptoms.

## 2011-06-05 ENCOUNTER — Encounter (HOSPITAL_COMMUNITY): Payer: Self-pay | Admitting: Anesthesiology

## 2011-06-05 ENCOUNTER — Encounter (HOSPITAL_COMMUNITY): Admission: EM | Disposition: A | Discharge status: Home / Self Care | Payer: Self-pay | Source: Home / Self Care

## 2011-06-05 ENCOUNTER — Inpatient Hospital Stay (HOSPITAL_COMMUNITY): Payer: Medicare Other

## 2011-06-05 ENCOUNTER — Inpatient Hospital Stay (HOSPITAL_COMMUNITY): Payer: Medicare Other | Admitting: Anesthesiology

## 2011-06-05 HISTORY — PX: CHOLECYSTECTOMY: SHX55

## 2011-06-05 LAB — CBC
MCH: 25.8 pg — ABNORMAL LOW (ref 26.0–34.0)
MCV: 83.6 fL (ref 78.0–100.0)
Platelets: 345 10*3/uL (ref 150–400)
RBC: 4.22 MIL/uL (ref 3.87–5.11)
RDW: 16 % — ABNORMAL HIGH (ref 11.5–15.5)

## 2011-06-05 LAB — COMPREHENSIVE METABOLIC PANEL
AST: 19 U/L (ref 0–37)
Alkaline Phosphatase: 113 U/L (ref 39–117)
BUN: 10 mg/dL (ref 6–23)
CO2: 24 mEq/L (ref 19–32)
Chloride: 103 mEq/L (ref 96–112)
Creatinine, Ser: 0.92 mg/dL (ref 0.50–1.10)
GFR calc non Af Amer: 69 mL/min — ABNORMAL LOW (ref >90)
Potassium: 4.6 mEq/L (ref 3.5–5.1)
Total Bilirubin: 0.5 mg/dL (ref 0.3–1.2)

## 2011-06-05 LAB — LIPASE, BLOOD: Lipase: 20 U/L (ref 11–59)

## 2011-06-05 SURGERY — LAPAROSCOPIC CHOLECYSTECTOMY WITH INTRAOPERATIVE CHOLANGIOGRAM
Anesthesia: General | Site: Abdomen | Wound class: Clean Contaminated

## 2011-06-05 MED ORDER — MIDAZOLAM HCL 5 MG/5ML IJ SOLN
INTRAMUSCULAR | Status: DC | PRN
  Administered 2011-06-05: 1 mg via INTRAVENOUS

## 2011-06-05 MED ORDER — BUPIVACAINE-EPINEPHRINE 0.25% -1:200000 IJ SOLN
INTRAMUSCULAR | Status: DC | PRN
  Administered 2011-06-05: 20 mL

## 2011-06-05 MED ORDER — GLYCOPYRROLATE 0.2 MG/ML IJ SOLN
INTRAMUSCULAR | Status: DC | PRN
  Administered 2011-06-05: .6 mg via INTRAVENOUS

## 2011-06-05 MED ORDER — KETOROLAC TROMETHAMINE 30 MG/ML IJ SOLN
15.0000 mg | Freq: Once | INTRAMUSCULAR | Status: AC | PRN
  Administered 2011-06-05: 30 mg via INTRAVENOUS

## 2011-06-05 MED ORDER — POTASSIUM CHLORIDE IN NACL 20-0.9 MEQ/L-% IV SOLN
INTRAVENOUS | Status: DC
  Administered 2011-06-05 – 2011-06-06 (×2): via INTRAVENOUS
  Filled 2011-06-05 (×2): qty 1000

## 2011-06-05 MED ORDER — LIDOCAINE HCL (CARDIAC) 20 MG/ML IV SOLN
INTRAVENOUS | Status: DC | PRN
  Administered 2011-06-05: 100 mg via INTRAVENOUS

## 2011-06-05 MED ORDER — LACTATED RINGERS IR SOLN
Status: DC | PRN
  Administered 2011-06-05: 1000 mL

## 2011-06-05 MED ORDER — ONDANSETRON HCL 4 MG/2ML IJ SOLN
INTRAMUSCULAR | Status: DC | PRN
  Administered 2011-06-05: 4 mg via INTRAVENOUS

## 2011-06-05 MED ORDER — SUCCINYLCHOLINE CHLORIDE 20 MG/ML IJ SOLN
INTRAMUSCULAR | Status: DC | PRN
  Administered 2011-06-05: 100 mg via INTRAVENOUS

## 2011-06-05 MED ORDER — LACTATED RINGERS IV SOLN
INTRAVENOUS | Status: DC
  Administered 2011-06-05: 1000 mL via INTRAVENOUS

## 2011-06-05 MED ORDER — ROCURONIUM BROMIDE 100 MG/10ML IV SOLN
INTRAVENOUS | Status: DC | PRN
  Administered 2011-06-05: 10 mg via INTRAVENOUS
  Administered 2011-06-05: 40 mg via INTRAVENOUS

## 2011-06-05 MED ORDER — PROMETHAZINE HCL 25 MG/ML IJ SOLN: 6.2500 mg | INTRAMUSCULAR | Status: DC | PRN

## 2011-06-05 MED ORDER — FENTANYL CITRATE 0.05 MG/ML IJ SOLN
INTRAMUSCULAR | Status: DC | PRN
  Administered 2011-06-05 (×3): 50 ug via INTRAVENOUS
  Administered 2011-06-05 (×2): 25 ug via INTRAVENOUS
  Administered 2011-06-05: 50 ug via INTRAVENOUS

## 2011-06-05 MED ORDER — PROPOFOL 10 MG/ML IV EMUL
INTRAVENOUS | Status: DC | PRN
  Administered 2011-06-05: 300 mg via INTRAVENOUS

## 2011-06-05 MED ORDER — IOHEXOL 300 MG/ML  SOLN
INTRAMUSCULAR | Status: DC | PRN
  Administered 2011-06-05: 9 mL via INTRAVENOUS

## 2011-06-05 MED ORDER — HYDROMORPHONE HCL PF 1 MG/ML IJ SOLN: 0.2500 mg | INTRAMUSCULAR | Status: DC | PRN

## 2011-06-05 MED ORDER — NEOSTIGMINE METHYLSULFATE 1 MG/ML IJ SOLN
INTRAMUSCULAR | Status: DC | PRN
  Administered 2011-06-05: 5 mg via INTRAVENOUS

## 2011-06-05 MED ORDER — KETOROLAC TROMETHAMINE 30 MG/ML IJ SOLN
INTRAMUSCULAR | Status: AC
  Filled 2011-06-05: qty 1

## 2011-06-05 SURGICAL SUPPLY — 45 items
ADH SKN CLS APL DERMABOND .7 (GAUZE/BANDAGES/DRESSINGS)
APL SKNCLS STERI-STRIP NONHPOA (GAUZE/BANDAGES/DRESSINGS)
APPLIER CLIP ROT 10 11.4 M/L (STAPLE)
APPLIER CLIP UNV 5X34 EPIX (ENDOMECHANICALS) ×1 IMPLANT
APR CLP MED LRG 11.4X10 (STAPLE)
APR XCLPCLP 20M/L UNV 34X5 (ENDOMECHANICALS) ×1
BAG SPEC RTRVL LRG 6X4 10 (ENDOMECHANICALS) ×1
BENZOIN TINCTURE PRP APPL 2/3 (GAUZE/BANDAGES/DRESSINGS) IMPLANT
CANISTER SUCTION 2500CC (MISCELLANEOUS) ×2 IMPLANT
CATH REDDICK CHOLANGI 4FR 50CM (CATHETERS) IMPLANT
CLIP APPLIE ROT 10 11.4 M/L (STAPLE) ×1 IMPLANT
CLOTH BEACON ORANGE TIMEOUT ST (SAFETY) ×2 IMPLANT
COVER MAYO STAND STRL (DRAPES) ×2 IMPLANT
DECANTER SPIKE VIAL GLASS SM (MISCELLANEOUS) ×2 IMPLANT
DERMABOND ADVANCED (GAUZE/BANDAGES/DRESSINGS)
DERMABOND ADVANCED .7 DNX12 (GAUZE/BANDAGES/DRESSINGS) IMPLANT
DRAPE C-ARM 42X72 X-RAY (DRAPES) ×2 IMPLANT
DRAPE LAPAROSCOPIC ABDOMINAL (DRAPES) ×2 IMPLANT
ELECT REM PT RETURN 9FT ADLT (ELECTROSURGICAL) ×2
ELECTRODE REM PT RTRN 9FT ADLT (ELECTROSURGICAL) ×1 IMPLANT
GLOVE BIOGEL PI IND STRL 7.0 (GLOVE) ×1 IMPLANT
GLOVE BIOGEL PI INDICATOR 7.0 (GLOVE) ×1
GLOVE SS BIOGEL STRL SZ 7.5 (GLOVE) ×1 IMPLANT
GLOVE SUPERSENSE BIOGEL SZ 7.5 (GLOVE) ×1
GOWN STRL NON-REIN LRG LVL3 (GOWN DISPOSABLE) ×2 IMPLANT
GOWN STRL REIN XL XLG (GOWN DISPOSABLE) ×4 IMPLANT
HEMOSTAT SURGICEL 4X8 (HEMOSTASIS) IMPLANT
IV CATH 14GX2 1/4 (CATHETERS) IMPLANT
IV SET EXT 30 76VOL 4 MALE LL (IV SETS) IMPLANT
KIT BASIN OR (CUSTOM PROCEDURE TRAY) ×2 IMPLANT
NS IRRIG 1000ML POUR BTL (IV SOLUTION) IMPLANT
POUCH SPECIMEN RETRIEVAL 10MM (ENDOMECHANICALS) ×1 IMPLANT
SET CHOLANGIOGRAPH MIX (MISCELLANEOUS) ×2 IMPLANT
SET IRRIG TUBING LAPAROSCOPIC (IRRIGATION / IRRIGATOR) ×2 IMPLANT
SLEEVE Z-THREAD 5X100MM (TROCAR) ×1 IMPLANT
SOLUTION ANTI FOG 6CC (MISCELLANEOUS) ×2 IMPLANT
STOPCOCK K 69 2C6206 (IV SETS) IMPLANT
STRIP CLOSURE SKIN 1/2X4 (GAUZE/BANDAGES/DRESSINGS) IMPLANT
SUT MNCRL AB 4-0 PS2 18 (SUTURE) ×2 IMPLANT
TOWEL OR 17X26 10 PK STRL BLUE (TOWEL DISPOSABLE) ×2 IMPLANT
TRAY LAP CHOLE (CUSTOM PROCEDURE TRAY) ×2 IMPLANT
TROCAR HASSON GELL 12X100 (TROCAR) ×2 IMPLANT
TROCAR Z-THREAD FIOS 11X100 BL (TROCAR) ×2 IMPLANT
TROCAR Z-THREAD FIOS 5X100MM (TROCAR) ×3 IMPLANT
TUBING INSUFFLATION 10FT LAP (TUBING) ×2 IMPLANT

## 2011-06-05 NOTE — Op Note (Signed)
Preoperative diagnosis: Cholelithiasis and cholecystitis  Postoperative diagnosis: Cholelithiasis and cholecystitis  Surgical procedure: Laparoscopic cholecystectomy with intraoperative cholangiogram  Surgeon: Sharlet Salina T. Meilech Virts M.D.  Assistant: None  Anesthesia: General Endotracheal  Complications: None  Estimated blood loss: Minimal  Description of procedure: The patient brought to the operating room, placed in the supine position on the operating table, and general endotracheal anesthesia induced. The abdomen was widely sterilely prepped and draped. The patient had received preoperative IV antibiotics and PAS were in place. Patient timeout was performed the correct procedure verified. Standard 4 port technique was used with an open Hassan cannula at the umbilicus and the remainder of the ports placed under direct vision. The gallbladder was visualized. It appeared essentially normal. The fundus was grasped and elevated up over the liver and the infundibulum retracted inferiolaterally. Peritoneum anterior and posterior to close triangle was incised and fibrofatty tissue stripped off the neck of the gallbladder toward the porta hepatis. The distal gallbladder was thoroughly dissected. The cystic artery was identified in close triangle and the cystic duct gallbladder junction dissected 360.  A good critical view was obtained. When the anatomy was clear the cystic duct was clipped at the gallbladder junction and an operative cholangiogram obtained through the cystic duct. This showed good filling of a normal common bile duct and intrahepatic ducts with free flow into the duodenum and no filling defects. Following this the Cholangiocath was removed and the cystic duct was doubly clipped proximally and divided. The cystic artery was doubly clipped proximally and distally and divided. The gallbladder was dissected free from its bed using hook cautery and removed through the umbilical port site.  Complete hemostasis was obtained in the gallbladder bed. The right upper quadrant was thoroughly irrigated and hemostasis assured. Trochars were removed and all CO2 evacuated and the [facility] trocar site fascial defect closed. Skin incisions were closed with subcuticular Monocryl and Dermabond. Sponge needle and instrument counts were correct. The patient was taken to PACU in good condition.  [name] T  06/05/2011

## 2011-06-05 NOTE — Anesthesia Postprocedure Evaluation (Signed)
  Anesthesia Post-op Note  Patient: Miranda Kelley  Procedure(s) Performed: Procedure(s) (LRB): LAPAROSCOPIC CHOLECYSTECTOMY WITH INTRAOPERATIVE CHOLANGIOGRAM (N/A)  Patient Location: PACU  Anesthesia Type: General  Level of Consciousness: awake and alert   Airway and Oxygen Therapy: Patient Spontanous Breathing  Post-op Pain: mild  Post-op Assessment: Post-op Vital signs reviewed, Patient's Cardiovascular Status Stable, Respiratory Function Stable, Patent Airway and No signs of Nausea or vomiting  Post-op Vital Signs: stable  Complications: No apparent anesthesia complications 

## 2011-06-05 NOTE — Progress Notes (Signed)
Patient ID: Miranda Kelley, female   DOB: 03/25/1955, 56 y.o.   MRN: [id number]    Subjective: Pt still with right flank pain no change  Objective: Vital signs in last 24 hours: Temp:  [97.6 F (36.4 C)-98.2 F (36.8 C)] 98 F (36.7 C) (06/03 6644) Pulse Rate:  [55-63] 59  (06/03 0637) Resp:  [16-18] 16  (06/03 0637) BP: (118-144)/(62-89) 118/71 mmHg (06/03 0637) SpO2:  [97 %-100 %] 98 % (06/03 0637) Weight:  [331 lb 5.6 oz (150.3 kg)] 331 lb 5.6 oz (150.3 kg) (06/02 1500) Last BM Date: 06/04/11  Intake/Output from previous day: 06/02 0701 - 06/03 0700 In: 1891.7 [P.O.:120; I.V.:1621.7; IV Piggyback:150] Out: 1400 [Urine:1400] Intake/Output this shift: Total I/O In: -  Out: 450 [Urine:450]  GI: abnormal findings:  obese and mild tenderness in the RUQ  Lab Results:   Basename 06/05/11 0428 06/04/11 0518  WBC 6.1 5.8  HGB 10.9* 10.5*  HCT 35.3* 34.3*  PLT 345 328   BMET  Basename 06/05/11 0428 06/04/11 0518  NA 135 137  K 4.6 4.2  CL 103 104  CO2 24 26  GLUCOSE 101* 100*  BUN 10 19  CREATININE 0.92 0.98  CALCIUM 9.7 9.7     Studies/Results: Ct Abdomen Pelvis Wo Contrast  06/04/2011  *RADIOLOGY REPORT*  Clinical Data: Abdominal pain and right lower flank pain for 1 week.  The  CT ABDOMEN AND PELVIS WITHOUT CONTRAST  Technique:  Multidetector CT imaging of the abdomen and pelvis was performed following the standard protocol without intravenous contrast.  Comparison: None.  Findings: Slight fibrosis in the lung bases.  The kidneys appear symmetrical.  No pyelocaliectasis or ureterectasis.  No renal, ureteral, or bladder stones.  Cholelithiasis with contracted gallbladder.  No inflammatory changes are appreciated.  The unenhanced appearance of the liver, spleen, pancreas, adrenal glands, abdominal aorta, and retroperitoneal lymph nodes is unremarkable.  The stomach, small bowel, and colon are not abnormally distended.  Diffusely stool filled colon.  No free air or free fluid  in the abdomen.  Pelvis:  Visualization of the low pelvis is limited due to streak artifact from a right hip arthroplasty.  The uterus and adnexal structures do not appear enlarged in the bladder wall does not appear thickened.  No free or loculated pelvic fluid collections. No evidence of diverticulitis.  There is segmental visualization of the appendix in the visualized portions appear normal.  IMPRESSION: Cholelithiasis with contracted gallbladder.  No renal or ureteral stone or obstruction.  Original Report Authenticated By: Marlon Pel, M.D.   Dg Abd Acute W/chest  06/04/2011  *RADIOLOGY REPORT*  Clinical Data: Right abdominal pain.  Constipation.  ACUTE ABDOMEN SERIES (ABDOMEN 2 VIEW & CHEST 1 VIEW)  Comparison: Chest dated 04/10/2011.  Findings: Poor inspiration.  Grossly normal sized heart.  Tortuous aorta.  Clear lungs.  Normal bowel gas pattern without free peritoneal air.  Mildly prominent stool.  Right total hip prosthesis.  Mild lumbar spine degenerative changes.  IMPRESSION: No acute abnormality.  Mildly prominent stool.  Original Report Authenticated By: Darrol Angel, M.D.   US Abdomen Limited Ruq  06/04/2011  *RADIOLOGY REPORT*  Clinical Data:  Cholelithiasis on CT.  LIMITED ABDOMINAL ULTRASOUND - RIGHT UPPER QUADRANT  Comparison:  CT abdomen and pelvis 06/04/2011  Findings:  Gallbladder:  The gallbladder is contracted and filled with multiple shadowing stones.  Murphy's sign is positive.  The appearance is nonspecific but can be associated with acute cholecystitis in the appropriate clinical setting.  Common bile duct:  Normal caliber bile ducts with diameter measured at about 5 mm.  Liver:  Increased and coarsened liver parenchymal echotexture pattern which might suggest fatty infiltration or cirrhosis.  IMPRESSION: Contracted stone filled gallbladder.  Positive Murphy's sign.  The appearance is nonspecific but can be associated with acute cholecystitis in the appropriate clinical  setting.                   Original Report Authenticated By: Marlon Pel, M.D.    Anti-infectives: Anti-infectives     Start     Dose/Rate Route Frequency Ordered Stop   06/04/11 1500   cefOXitin (MEFOXIN) 2 g in dextrose 5 % 50 mL IVPB        2 g 100 mL/hr over 30 Minutes Intravenous Every 6 hours 06/04/11 1356            Assessment/Plan: Persistent biliary colic or early cholecystitis.  Will plan to proceed with laparoscopic cholecystectomy I discussed the procedure in detail.    We discussed the risks and benefits of a laparoscopic cholecystectomy and possible cholangiogram including, but not limited to bleeding, infection, injury to surrounding structures such as the intestine or liver, bile leak, retained gallstones, need to convert to an open procedure, prolonged diarrhea, blood clots such as  DVT, common bile duct injury, anesthesia risks, and possible need for additional procedures.  The likelihood of improvement in symptoms and return to the patient's normal status is good. We discussed the typical post-operative recovery course.     LOS: 2 days    Gurley Climer T 06/05/2011

## 2011-06-05 NOTE — Anesthesia Preprocedure Evaluation (Addendum)
Anesthesia Evaluation  Patient identified by MRN, date of birth, ID band Patient awake    Reviewed: Allergy & Precautions, H&P , NPO status , Patient's Chart, lab work & pertinent test results  History of Anesthesia Complications (+) AWARENESS UNDER ANESTHESIA  Airway Mallampati: II TM Distance: <3 FB Neck ROM: Full    Dental No notable dental hx.    Pulmonary neg pulmonary ROS,  breath sounds clear to auscultation  + decreased breath sounds      Cardiovascular hypertension, negative cardio ROS  Rhythm:Regular Rate:Normal     Neuro/Psych negative neurological ROS  negative psych ROS   GI/Hepatic negative GI ROS, Neg liver ROS, GERD-  ,  Endo/Other  negative endocrine ROSHypothyroidism Morbid obesity  Renal/GU negative Renal ROS  negative genitourinary   Musculoskeletal negative musculoskeletal ROS (+)   Abdominal   Peds negative pediatric ROS (+)  Hematology  (+) HIV,   Anesthesia Other Findings   Reproductive/Obstetrics negative OB ROS                          Anesthesia Physical Anesthesia Plan  ASA: III  Anesthesia Plan: General   Post-op Pain Management:    Induction: Intravenous  Airway Management Planned: Oral ETT  Additional Equipment:   Intra-op Plan:   Post-operative Plan: Extubation in OR  Informed Consent: I have reviewed the patients History and Physical, chart, labs and discussed the procedure including the risks, benefits and alternatives for the proposed anesthesia with the patient or authorized representative who has indicated his/her understanding and acceptance.   Dental advisory given  Plan Discussed with: CRNA  Anesthesia Plan Comments:         Anesthesia Quick Evaluation

## 2011-06-05 NOTE — Progress Notes (Signed)
UR complete 

## 2011-06-05 NOTE — Anesthesia Postprocedure Evaluation (Signed)
  Anesthesia Post-op Note  Patient: Miranda Kelley  Procedure(s) Performed: Procedure(s) (LRB): LAPAROSCOPIC CHOLECYSTECTOMY WITH INTRAOPERATIVE CHOLANGIOGRAM (N/A)  Patient Location: PACU  Anesthesia Type: General  Level of Consciousness: awake and alert   Airway and Oxygen Therapy: Patient Spontanous Breathing  Post-op Pain: mild  Post-op Assessment: Post-op Vital signs reviewed, Patient's Cardiovascular Status Stable, Respiratory Function Stable, Patent Airway and No signs of Nausea or vomiting  Post-op Vital Signs: stable  Complications: No apparent anesthesia complications

## 2011-06-05 NOTE — Transfer of Care (Signed)
Immediate Anesthesia Transfer of Care Note  Patient: Miranda Kelley  Procedure(s) Performed: Procedure(s) (LRB): LAPAROSCOPIC CHOLECYSTECTOMY WITH INTRAOPERATIVE CHOLANGIOGRAM (N/A)  Patient Location: PACU  Anesthesia Type: General  Level of Consciousness: awake, alert  and oriented  Airway & Oxygen Therapy: Patient Spontanous Breathing and Patient connected to face mask oxygen  Post-op Assessment: Report given to PACU RN and Post -op Vital signs reviewed and stable  Post vital signs: Reviewed and stable  Complications: No apparent anesthesia complications

## 2011-06-05 NOTE — Preoperative (Signed)
Beta Blockers   Reason not to administer Beta Blockers:Not Applicable 

## 2011-06-06 ENCOUNTER — Encounter (HOSPITAL_COMMUNITY): Payer: Self-pay | Admitting: General Surgery

## 2011-06-06 MED ORDER — ACETAMINOPHEN 325 MG PO TABS: 650.0000 mg | ORAL_TABLET | Freq: Four times a day (QID) | ORAL | Status: AC | PRN

## 2011-06-06 MED ORDER — OXYCODONE HCL 5 MG PO TABS: 5.0000 mg | ORAL_TABLET | ORAL | Status: AC | PRN

## 2011-06-06 NOTE — Discharge Summary (Signed)
Physician Discharge Summary  Patient ID: Miranda Kelley MRN: [id number] DOB/AGE: September 04, 1955 56 y.o.  Admit date: 06/03/2011 Discharge date: 06/06/2011  Admission Diagnoses: Cholelithiasis and cholecystitis Chronic sinus infection  Hypertension  Thyroid disease  BMI 53   Discharge Diagnoses: same Active Problems:  Symptomatic cholelithiasis   PROCEDURES: Laparoscopic cholecystectomy with intraoperative cholangiogram 06/05/2011, Dr. Vikki Ports Course: this is a morbidly obese 56 year old female who presents with a one-week history of right upper quadrant abdominal pain. This started after fatty meals but now occurs about anything she eats. She has had no nausea or vomiting. Bowel movements are normal. By chart review, she is HIV positive. She denies jaundice. She is uncertain if she has had prior attacks. The pain is described as sharp, continuous, and moderate in intensity. Unfortunately, when I came to see her in the emergency department, she was eating breakfast and drinking coffee. She has no other complaints.  Pt was admitted to hospital and taken to the OR the following afternoon.  She has done well and had a normal breakfast.  Minimal post op discomfort.  We plan for her to go home later today.  Follow up in 3 weeks with DR. Hoxworth.  Condition on D/C:  Improved  Disposition: 01-Home or Self Care   Medication List  As of 06/06/2011  8:32 AM   STOP taking these medications         oxyCODONE-acetaminophen 7.5-500 MG per tablet         TAKE these medications         acetaminophen 325 MG tablet   Commonly known as: TYLENOL   Take 2 tablets (650 mg total) by mouth every 6 (six) hours as needed for pain.      diphenhydrAMINE 25 MG tablet   Commonly known as: BENADRYL   Take 25 mg by mouth every 6 (six) hours as needed. Allergies      fexofenadine 180 MG tablet   Commonly known as: ALLEGRA   Take 180 mg by mouth daily.      levothyroxine 150 MCG tablet   Commonly known as: SYNTHROID, LEVOTHROID   Take 150 mcg by mouth daily.      naproxen sodium 220 MG tablet   Commonly known as: ANAPROX   Take 220 mg by mouth 2 (two) times daily with a meal.      oxyCODONE 5 MG immediate release tablet   Commonly known as: Oxy IR/ROXICODONE   Take 1 tablet (5 mg total) by mouth every 4 (four) hours as needed.      triamcinolone ointment 0.5 %   Commonly known as: KENALOG   Apply topically 2 (two) times daily. Itching .           Follow-up Information    Follow up with HOXWORTH,BENJAMIN T, MD. Schedule an appointment as soon as possible for a visit in 3 weeks.   Contact information:   [employer] [address], [address] [place] [address] [phone]          Signed: Sherrie George 06/06/2011, 8:32 AM

## 2011-06-06 NOTE — Progress Notes (Signed)
1 Day Post-Op  Subjective: She just ate breakfast and feels pretty good.  Objective: Vital signs in last 24 hours: Temp:  [97.1 F (36.2 C)-99.2 F (37.3 C)] 97.9 F (36.6 C) (06/04 0630) Pulse Rate:  [47-105] 71  (06/04 0630) Resp:  [12-19] 18  (06/04 0630) BP: (91-148)/(57-86) 126/85 mmHg (06/04 0630) SpO2:  [95 %-100 %] 97 % (06/04 0630) Last BM Date: 06/04/11  Afebrile, Tm 99.2, labs OK, IOC: no stones or obstruction  Intake/Output from previous day: 06/03 0701 - 06/04 0700 In: 2943.8 [P.O.:480; I.V.:2463.8] Out: 1660 [Urine:1650] Intake/Output this shift:    General appearance: alert, cooperative and no distress GI: soft, incisions all look good.  she is sore,but otherwise doing well.  Lab Results:   Basename 06/05/11 0428 06/04/11 0518  WBC 6.1 5.8  HGB 10.9* 10.5*  HCT 35.3* 34.3*  PLT 345 328    BMET  Basename 06/05/11 0428 06/04/11 0518  NA 135 137  K 4.6 4.2  CL 103 104  CO2 24 26  GLUCOSE 101* 100*  BUN 10 19  CREATININE 0.92 0.98  CALCIUM 9.7 9.7   PT/INR No results found for this basename: LABPROT:2,INR:2 in the last 72 hours   Lab 06/05/11 0428 06/04/11 0518  AST 19 18  ALT 16 15  ALKPHOS 113 110  BILITOT 0.5 0.2*  PROT 8.4* 8.6*  ALBUMIN 3.6 3.7     Lipase     Component Value Date/Time   LIPASE 20 06/05/2011 0428     Studies/Results: Dg Cholangiogram Operative  06/05/2011  *RADIOLOGY REPORT*  Clinical Data:   gallstones.  INTRAOPERATIVE CHOLANGIOGRAM  Comparison: CT 06/04/2011  Findings: Intraoperative spot images show normal caliber biliary system.  No evidence of retained stone or obstruction.  Free passage of contrast into the small bowel noted.  IMPRESSION: No evidence of retained stone or obstruction.  These images were submitted for radiologic interpretation only. Please see the procedural report for the amount of contrast and the fluoroscopy time utilized.  Original Report Authenticated By: Cyndie Chime, M.D.     Medications:    . enoxaparin  40 mg Subcutaneous Q24H  . ketorolac      . levothyroxine  150 mcg Oral Q breakfast  . pantoprazole (PROTONIX) IV  40 mg Intravenous QHS  . DISCONTD: cefOXitin  2 g Intravenous Q6H    Assessment/Plan Cholelithiasis and cholecystitis s/p Laparoscopic cholecystectomy with intraoperative cholangiogram 06/05/11 DR. Hoxworth. Chronic sinus infection  Hypertension  Thyroid disease    Plan:  We will walk her around the halls a couple time and then let her go home F/U Dr. Johna Sheriff 3 weeks.        LOS: 3 days    Miranda Kelley 06/06/2011

## 2011-06-06 NOTE — Discharge Instructions (Signed)
[facility] ______[facility] LAPAROSCOPIC SURGERY: POST OP INSTRUCTIONS Always review your discharge instruction sheet given to you by the facility where your surgery was performed. IF YOU HAVE DISABILITY OR FAMILY LEAVE FORMS, YOU MUST BRING THEM TO THE OFFICE FOR PROCESSING.   DO NOT GIVE THEM TO YOUR DOCTOR.  1. A prescription for pain medication may be given to you upon discharge.  Take your pain medication as prescribed, if needed.  If narcotic pain medicine is not needed, then you may take acetaminophen (Tylenol) or ibuprofen (Advil) as needed. 2. Take your usually prescribed medications unless otherwise directed. 3. If you need a refill on your pain medication, please contact your pharmacy.  They will contact our office to request authorization. Prescriptions will not be filled after 5pm or on week-ends. 4. You should follow a light diet the first few days after arrival home, such as soup and crackers, etc.  Be sure to include lots of fluids daily. 5. Most patients will experience some swelling and bruising in the area of the incisions.  Ice packs will help.  Swelling and bruising can take several days to resolve.  6. It is common to experience some constipation if taking pain medication after surgery.  Increasing fluid intake and taking a stool softener (such as Colace) will usually help or prevent this problem from occurring.  A mild laxative (Milk of Magnesia or Miralax) should be taken according to package instructions if there are no bowel movements after 48 hours. 7. Unless discharge instructions indicate otherwise, you may remove your bandages 24-48 hours after surgery, and you may shower at that time.  You may have steri-strips (small skin tapes) in place directly over the incision.  These strips should be left on the skin for 7-10 days.  If your surgeon used skin glue on the incision, you may shower in 24 hours.  The glue will flake off over the next 2-3 weeks.  Any sutures or  staples will be removed at the office during your follow-up visit. 8. ACTIVITIES:  You may resume regular (light) daily activities beginning the next day--such as daily self-care, walking, climbing stairs--gradually increasing activities as tolerated.  You may have sexual intercourse when it is comfortable.  Refrain from any heavy lifting or straining until approved by your doctor. a. You may drive when you are no longer taking prescription pain medication, you can comfortably wear a seatbelt, and you can safely maneuver your car and apply brakes. b. RETURN TO WORK:  __________________________________________________________ 9. You should see your doctor in the office for a follow-up appointment approximately 2-3 weeks after your surgery.  Make sure that you call for this appointment within a day or two after you arrive home to insure a convenient appointment time. 10. OTHER INSTRUCTIONS: __________________________________________________________________________________________________________________________ __________________________________________________________________________________________________________________________ WHEN TO CALL YOUR DOCTOR: 1. Fever over 101.0 2. Inability to urinate 3. Continued bleeding from incision. 4. Increased pain, redness, or drainage from the incision. 5. Increasing abdominal pain  The clinic staff is available to answer your questions during regular business hours.  Please don't hesitate to call and ask to speak to one of the nurses for clinical concerns.  If you have a medical emergency, go to the nearest emergency room or call 911.  A surgeon from [facility] is always on call at the hospital. [address], [address], [place], [place]  [address] ? [address] [address], [place], [place]   [address] [phone] ? [phone] ? FAX [phone] Web site:   [email or web address] Laparoscopic Cholecystectomy Laparoscopic cholecystectomy is  surgery to remove the gallbladder. The gallbladder is located slightly to the right of center in the abdomen, behind the liver. It is a concentrating and storage sac for the bile produced in the liver. Bile aids in the digestion and absorption of fats. Gallbladder disease (cholecystitis) is an inflammation of your gallbladder. This condition is usually caused by a buildup of gallstones (cholelithiasis) in your gallbladder. Gallstones can block the flow of bile, resulting in inflammation and pain. In severe cases, emergency surgery may be required. When emergency surgery is not required, you will have time to prepare for the procedure. Laparoscopic surgery is an alternative to open surgery. Laparoscopic surgery usually has a shorter recovery time. Your common bile duct may also need to be examined and explored. Your caregiver will discuss this with you if he or she feels this should be done. If stones are found in the common bile duct, they may be removed. LET YOUR CAREGIVER KNOW ABOUT:  Allergies to food or medicine.   Medicines taken, including vitamins, herbs, eyedrops, over-the-counter medicines, and creams.   Use of steroids (by mouth or creams).   Previous problems with anesthetics or numbing medicines.   History of bleeding problems or blood clots.   Previous surgery.   Other health problems, including diabetes and kidney problems.   Possibility of pregnancy, if this applies.  RISKS AND COMPLICATIONS All surgery is associated with risks. Some problems that may occur following this procedure include:  Infection.   Damage to the common bile duct, nerves, arteries, veins, or other internal organs such as the stomach or intestines.   Bleeding.   A stone may remain in the common bile duct.  BEFORE THE PROCEDURE  Do not take aspirin for 3 days prior to surgery or blood thinners for 1 week prior to surgery.   Do not eat or drink anything after midnight the night before surgery.    Let your caregiver know if you develop a cold or other infectious problem prior to surgery.   You should be present 60 minutes before the procedure or as directed.  PROCEDURE  You will be given medicine that makes you sleep (general anesthetic). When you are asleep, your surgeon will make several small cuts (incisions) in your abdomen. One of these incisions is used to insert a small, lighted scope (laparoscope) into the abdomen. The laparoscope helps the surgeon see into your abdomen. Carbon dioxide gas will be pumped into your abdomen. The gas allows more room for the surgeon to perform your surgery. Other operating instruments are inserted through the other incisions. Laparoscopic procedures may not be appropriate when:  There is major scarring from previous surgery.   The gallbladder is extremely inflamed.   There are bleeding disorders or unexpected cirrhosis of the liver.   A pregnancy is near term.   Other conditions make the laparoscopic procedure impossible.  If your surgeon feels it is not safe to continue with a laparoscopic procedure, he or she will perform an open abdominal procedure. In this case, the surgeon will make an incision to open the abdomen. This gives the surgeon a larger view and field to work within. This may allow the surgeon to perform procedures that sometimes cannot be performed with a laparoscope alone. Open surgery has a longer recovery time. AFTER THE PROCEDURE  You will be taken to the recovery area where a nurse will watch and check your progress.   You may be allowed to   go home the same day.   Do not resume physical activities until directed by your caregiver.   You may resume a normal diet and activities as directed.  Document Released: 12/19/2004 Document Revised: 12/08/2010 Document Reviewed: 06/03/2010 [employer] Patient Information 2012 [employer]. 

## 2011-06-08 ENCOUNTER — Telehealth: Payer: Self-pay | Admitting: *Deleted

## 2011-06-08 ENCOUNTER — Encounter: Payer: Self-pay | Admitting: *Deleted

## 2011-06-08 NOTE — Telephone Encounter (Signed)
Message left requesting pt to call [facility].  Pt needs to schedule MD, Lab and PAP smear follow-up appts.  Will also send pt a letter with this information.

## 2011-06-11 ENCOUNTER — Encounter (HOSPITAL_COMMUNITY): Payer: Self-pay | Admitting: *Deleted

## 2011-06-11 ENCOUNTER — Emergency Department (HOSPITAL_COMMUNITY)
Admission: EM | Admit: 2011-06-11 | Discharge: 2011-06-11 | Disposition: A | Discharge status: Home / Self Care | Payer: Medicare Other | Attending: Emergency Medicine | Admitting: Emergency Medicine

## 2011-06-11 DIAGNOSIS — E079 Disorder of thyroid, unspecified: Secondary | ICD-10-CM | POA: Insufficient documentation

## 2011-06-11 DIAGNOSIS — I10 Essential (primary) hypertension: Secondary | ICD-10-CM | POA: Insufficient documentation

## 2011-06-11 DIAGNOSIS — B029 Zoster without complications: Secondary | ICD-10-CM | POA: Insufficient documentation

## 2011-06-11 MED ORDER — HYDROCODONE-ACETAMINOPHEN 5-325 MG PO TABS: 1.0000 | ORAL_TABLET | Freq: Four times a day (QID) | ORAL | Status: AC | PRN

## 2011-06-11 MED ORDER — VALACYCLOVIR HCL 1 G PO TABS: 1000.0000 mg | ORAL_TABLET | Freq: Three times a day (TID) | ORAL | Status: AC

## 2011-06-11 NOTE — Discharge Instructions (Signed)
Return here as needed.  Followup with your primary care Dr. for recheck °

## 2011-06-11 NOTE — ED Provider Notes (Signed)
History     CSN: [id number]  Arrival date & time 06/11/11  1447   First MD Initiated Contact with Patient 06/11/11 1709      Chief Complaint  Patient presents with  . Wound Check  . Rash    (Consider location/radiation/quality/duration/timing/severity/associated sxs/prior treatment) HPI Presents emergency department with a rash on her back that extends around to the abdomen on the right.  Patient, states the area is burning and painful.  She states that she recently had surgery for her gallbladder and it started after she got home from the hospital.  Patient has fevers, nausea, vomiting, or weakness.  Past Medical History  Diagnosis Date  . Hypertension   . Thyroid disease   . Chronic sinus infection     Past Surgical History  Procedure Date  . Joint replacement 06/2009    right  . Cholecystectomy 06/05/2011    Procedure: LAPAROSCOPIC CHOLECYSTECTOMY WITH INTRAOPERATIVE CHOLANGIOGRAM;  Surgeon: Mariella Saa, MD;  Location: [facility];  Service: General;  Laterality: N/A;    History reviewed. No pertinent family history.  History  Substance Use Topics  . Smoking status: Never Smoker   . Smokeless tobacco: Not on file  . Alcohol Use: Yes     occas    OB History    Grav Para Term Preterm Abortions TAB SAB Ect Mult Living                  Review of Systems All other systems negative except as documented in the HPI. All pertinent positives and negatives as reviewed in the HPI.  Allergies  Review of patient's allergies indicates no known allergies.  Home Medications   Current Outpatient Rx  Name Route Sig Dispense Refill  . ACETAMINOPHEN 325 MG PO TABS Oral Take 2 tablets (650 mg total) by mouth every 6 (six) hours as needed for pain.    [name] DIPHENHYDRAMINE HCL 25 MG PO TABS Oral Take 25 mg by mouth every 6 (six) hours as needed. Allergies    . FEXOFENADINE HCL 180 MG PO TABS Oral Take 180 mg by mouth daily.      [name] LEVOTHYROXINE SODIUM 150 MCG PO TABS Oral Take  150 mcg by mouth daily.      [name] NAPROXEN SODIUM 220 MG PO TABS Oral Take 220 mg by mouth 2 (two) times daily with a meal.    . OXYCODONE HCL 5 MG PO TABS Oral Take 1 tablet (5 mg total) by mouth every 4 (four) hours as needed. 40 tablet 0  . TRIAMCINOLONE ACETONIDE 0.5 % EX OINT Topical Apply topically 2 (two) times daily. Itching .      BP 146/87  Pulse 78  Temp(Src) 98.4 F (36.9 C) (Oral)  Resp 16  SpO2 98%  LMP 04/03/2011  Physical Exam  Constitutional: She is oriented to person, place, and time. She appears well-developed and well-nourished.  HENT:  Head: Normocephalic and atraumatic.  Neurological: She is alert and oriented to person, place, and time.  Skin: Skin is warm and dry. Rash noted. Rash is vesicular.       ED Course  Procedures (including critical care time)  Patient has herpes zoster, based on her physical exam findings.  Patient is advised to return here as needed.  Also advised to followup with her primary care Dr. for recheck.  Rash is of a dermatomal pattern and it has ruptured vesicles.  MDM          Carlyle Dolly, PA-C  06/12/11 0108 

## 2011-06-11 NOTE — ED Notes (Signed)
Pt from home with reports of a reddened area to right side/flank area and below umbilicus as well as drainage from laparoscopic incision from cholecystectomy on Monday. Pt reports that drainage and redness have been present since being discharged from hospital on Tuesday but redness and pain to skin are getting worse. Pt reports feeling as if she had fever during the night but denies N/V/D.

## 2011-06-12 NOTE — ED Provider Notes (Signed)
Medical screening examination/treatment/procedure(s) were performed by non-physician practitioner and as supervising physician I was immediately available for consultation/collaboration.   Jaxxen Voong, MD 06/12/11 1544 

## 2011-06-29 ENCOUNTER — Encounter (INDEPENDENT_AMBULATORY_CARE_PROVIDER_SITE_OTHER): Payer: Medicare Other | Admitting: General Surgery

## 2011-08-24 ENCOUNTER — Other Ambulatory Visit: Payer: Self-pay | Admitting: Infectious Disease

## 2011-08-24 ENCOUNTER — Other Ambulatory Visit: Payer: Self-pay

## 2011-08-24 DIAGNOSIS — B20 Human immunodeficiency virus [HIV] disease: Secondary | ICD-10-CM

## 2011-08-24 LAB — CBC WITH DIFFERENTIAL/PLATELET
Basophils Absolute: 0 10*3/uL (ref 0.0–0.1)
Basophils Relative: 0 % (ref 0–1)
Eosinophils Absolute: 0.2 10*3/uL (ref 0.0–0.7)
MCH: 26.7 pg (ref 26.0–34.0)
MCHC: 32.2 g/dL (ref 30.0–36.0)
Neutrophils Relative %: 41 % — ABNORMAL LOW (ref 43–77)
Platelets: 385 10*3/uL (ref 150–400)

## 2011-08-24 LAB — COMPREHENSIVE METABOLIC PANEL
ALT: 14 U/L (ref 0–35)
AST: 17 U/L (ref 0–37)
Alkaline Phosphatase: 106 U/L (ref 39–117)
Sodium: 138 mEq/L (ref 135–145)
Total Bilirubin: 0.4 mg/dL (ref 0.3–1.2)
Total Protein: 8 g/dL (ref 6.0–8.3)

## 2011-08-28 LAB — HIV-1 RNA QUANT-NO REFLEX-BLD: HIV-1 RNA Quant, Log: 3.47 {Log} — ABNORMAL HIGH (ref <1.30)

## 2011-09-19 ENCOUNTER — Encounter: Payer: Self-pay | Admitting: Infectious Disease

## 2011-09-19 ENCOUNTER — Ambulatory Visit (INDEPENDENT_AMBULATORY_CARE_PROVIDER_SITE_OTHER): Payer: Medicare Other | Admitting: Infectious Disease

## 2011-09-19 VITALS — BP 153/96 | HR 78 | Temp 98.1°F | Ht 65.0 in | Wt 338.0 lb

## 2011-09-19 DIAGNOSIS — B20 Human immunodeficiency virus [HIV] disease: Secondary | ICD-10-CM

## 2011-09-19 DIAGNOSIS — F329 Major depressive disorder, single episode, unspecified: Secondary | ICD-10-CM

## 2011-09-19 DIAGNOSIS — E039 Hypothyroidism, unspecified: Secondary | ICD-10-CM

## 2011-09-19 DIAGNOSIS — R030 Elevated blood-pressure reading, without diagnosis of hypertension: Secondary | ICD-10-CM

## 2011-09-19 DIAGNOSIS — K8020 Calculus of gallbladder without cholecystitis without obstruction: Secondary | ICD-10-CM

## 2011-09-19 NOTE — Assessment & Plan Note (Signed)
Will try to enroll her in ARIA

## 2011-09-19 NOTE — Assessment & Plan Note (Signed)
Continues to have depressive symptoms related to her relationship with her HIV positive former boyfriend.

## 2011-09-19 NOTE — Progress Notes (Signed)
Subjective:    Patient ID: Miranda Kelley, female    DOB: 1955-08-10, 56 y.o.   MRN: [id number]  HPI  56 year old [place] American lady with HIV infection and chronic low viremia and high cd4 count well above 1000. She returns to clinic today. In past Dr. Philipp Deputy had deferred this in the past. She considered these START trial in the past but was unwilling to begin antiviral medicaitons should she be randomized to starting therapy. I again explained to her that she would definitely need to be on ARV for the rest of her life and that current DHHS guidelines would recommend her starting therapy.  She apparently recently had a laparoscopic cholecystectomy for gallstones it was complicated by an outbreak of herpes zoster.  Her CD4 count does remain high viral load in the 2000 range. I have recommended to her that she consider enrollment in a clinical trial known as "ARIA" which randomizes HIV positive women naive to therapy to either an all in one pill (Tivicay/Abacavir/Lamivudine) or Reyataz Norvir and Truvada. 2 Naprosyn my research coronary spoke with patient today as w  He was again tearful about her problems with her former boyfriend who still lives with her who does have HIV and continues to have problems with substance abuse. I offered her referral to one of our counselors here but she wished not to proceed without. She also did not want antidepressant medication. She refused vaccination with influenza and pneumococcal vaccine despite my recommending these vaccines to her.  I spent greater than 45 minutes with the patient including greater than 50% of time in face to face counsel of the patient and in coordination of their care.    Review of Systems  Constitutional: Negative for fever, chills, diaphoresis, activity change, appetite change, fatigue and unexpected weight change.  HENT: Negative for congestion, sore throat, rhinorrhea, sneezing, trouble swallowing and sinus pressure.   Eyes:  Negative for photophobia and visual disturbance.  Respiratory: Negative for cough, chest tightness, shortness of breath, wheezing and stridor.   Cardiovascular: Negative for chest pain, palpitations and leg swelling.  Gastrointestinal: Negative for nausea, vomiting, abdominal pain, diarrhea, constipation, blood in stool, abdominal distention and anal bleeding.  Genitourinary: Negative for dysuria, hematuria, flank pain and difficulty urinating.  Musculoskeletal: Negative for myalgias, back pain, joint swelling, arthralgias and gait problem.  Skin: Negative for color change, pallor, rash and wound.  Neurological: Negative for dizziness, tremors, weakness and light-headedness.  Hematological: Negative for adenopathy. Does not bruise/bleed easily.  Psychiatric/Behavioral: Positive for dysphoric mood. Negative for behavioral problems, confusion, disturbed wake/sleep cycle, decreased concentration and agitation. The patient is nervous/anxious.        Objective:   Physical Exam  Constitutional: She is oriented to person, place, and time. She appears well-developed and well-nourished. No distress.  HENT:  Head: Normocephalic and atraumatic.  Mouth/Throat: Oropharynx is clear and moist. No oropharyngeal exudate.  Eyes: Conjunctivae normal and EOM are normal. Pupils are equal, round, and reactive to light. No scleral icterus.  Neck: Normal range of motion. Neck supple. No JVD present.  Cardiovascular: Normal rate, regular rhythm and normal heart sounds.  Exam reveals no gallop and no friction rub.   No murmur heard. Pulmonary/Chest: Effort normal and breath sounds normal. No respiratory distress. She has no wheezes. She has no rales. She exhibits no tenderness.  Abdominal: She exhibits no distension and no mass. There is no tenderness. There is no rebound and no guarding.  Musculoskeletal: She exhibits no edema and no  tenderness.  Lymphadenopathy:    She has no cervical adenopathy.  Neurological:  She is alert and oriented to person, place, and time. She has normal reflexes. She exhibits normal muscle tone. Coordination normal.  Skin: Skin is warm and dry. She is not diaphoretic. No erythema. No pallor.  Psychiatric: Her behavior is normal. Judgment and thought content normal. Her mood appears anxious.          Assessment & Plan:  HIV INFECTION Will try to enroll her in ARIA  ELEVATED BLOOD PRESSURE WITHOUT DIAGNOSIS OF HYPERTENSION Pt believes this is related to stress and anger at her former boyfriend  HYPOTHYROIDISM On Synthroid  Symptomatic cholelithiasis Status post cholecystectomy.  Depressive episode Continues to have depressive symptoms related to her relationship with her HIV positive former boyfriend.

## 2011-09-19 NOTE — Patient Instructions (Addendum)
Please consider the ARIA study that Miranda Kelley and I talked to you about  If you enroll in this study future labs, medicines will be free (through the study for duration of the study)

## 2011-09-19 NOTE — Assessment & Plan Note (Signed)
Pt believes this is related to stress and anger at her former boyfriend

## 2011-09-19 NOTE — Assessment & Plan Note (Signed)
Status post cholecystectomy

## 2011-09-19 NOTE — Assessment & Plan Note (Signed)
On Synthroid

## 2011-11-07 ENCOUNTER — Encounter (INDEPENDENT_AMBULATORY_CARE_PROVIDER_SITE_OTHER): Payer: Self-pay | Admitting: *Deleted

## 2011-11-07 ENCOUNTER — Ambulatory Visit (HOSPITAL_COMMUNITY)
Admission: RE | Admit: 2011-11-07 | Discharge: 2011-11-07 | Disposition: A | Discharge status: Home / Self Care | Payer: Medicare Other | Source: Ambulatory Visit | Attending: Infectious Disease | Admitting: Infectious Disease

## 2011-11-07 ENCOUNTER — Ambulatory Visit: Payer: Medicare Other | Admitting: *Deleted

## 2011-11-07 ENCOUNTER — Other Ambulatory Visit: Payer: Self-pay

## 2011-11-07 ENCOUNTER — Encounter: Payer: Self-pay | Admitting: *Deleted

## 2011-11-07 VITALS — BP 137/80 | HR 68 | Temp 97.4°F | Resp 18 | Wt 330.2 lb

## 2011-11-07 DIAGNOSIS — B20 Human immunodeficiency virus [HIV] disease: Secondary | ICD-10-CM

## 2011-11-07 DIAGNOSIS — Z01810 Encounter for preprocedural cardiovascular examination: Secondary | ICD-10-CM | POA: Insufficient documentation

## 2011-11-07 NOTE — Progress Notes (Signed)
Patient was here to screen for the ARIA study. After careful review of the study informed consent and discussion of the expectations of the study candidate, consent was obtained. She said she understood how the study worked and that she would be expected to take whatever meds she was randomized to for the length of the study. She understands that it is completely voluntary and she can withdraw at any time. She is postmenopausal and does not require pregnancy testing. An EKG was obtained for Dr. Daiva Eves to review. Medical history, vital signs and blood for lab tests were also obtained. 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 She has also been taking a diet medication called Infinity for the past 2 weeks that she ordered on-line. We will wait to schedule the entry visit once the labs have returned.

## 2011-11-27 ENCOUNTER — Ambulatory Visit (HOSPITAL_COMMUNITY): Payer: Medicare Other

## 2011-11-27 ENCOUNTER — Ambulatory Visit (HOSPITAL_COMMUNITY)
Admission: RE | Admit: 2011-11-27 | Discharge: 2011-11-27 | Disposition: A | Discharge status: Home / Self Care | Payer: Medicare Other | Source: Ambulatory Visit | Attending: Infectious Disease | Admitting: Infectious Disease

## 2011-11-27 ENCOUNTER — Other Ambulatory Visit: Payer: Self-pay

## 2011-11-27 ENCOUNTER — Ambulatory Visit (INDEPENDENT_AMBULATORY_CARE_PROVIDER_SITE_OTHER): Payer: Medicare Other | Admitting: *Deleted

## 2011-11-27 VITALS — Ht 66.0 in | Wt 336.5 lb

## 2011-11-27 DIAGNOSIS — B20 Human immunodeficiency virus [HIV] disease: Secondary | ICD-10-CM

## 2011-11-27 DIAGNOSIS — R9431 Abnormal electrocardiogram [ECG] [EKG]: Secondary | ICD-10-CM | POA: Insufficient documentation

## 2011-11-27 NOTE — Progress Notes (Signed)
Patient came in today to enroll in the ARIA study but after review of her EKG she was deemed ineligible by Dr. Daiva Eves.  Her QTc interval was 470. We repeated the EKG and it was still too long at 481. I discussed other options with the patient regarding treatment. She has been reluctant to start meds in the past. I discussed the rationale for starting meds earlier versus waiting until her immune system is compromised. She said she will think about it and schedule an appointment with Dr. Daiva Eves.

## 2011-11-28 ENCOUNTER — Telehealth: Payer: Self-pay | Admitting: *Deleted

## 2011-11-28 NOTE — Telephone Encounter (Signed)
Message left to call [facility] to schedule PAP smear appt.

## 2011-12-22 ENCOUNTER — Encounter (HOSPITAL_COMMUNITY): Payer: Self-pay | Admitting: Emergency Medicine

## 2011-12-22 ENCOUNTER — Emergency Department (INDEPENDENT_AMBULATORY_CARE_PROVIDER_SITE_OTHER)
Admission: EM | Admit: 2011-12-22 | Discharge: 2011-12-22 | Disposition: A | Discharge status: Home / Self Care | Payer: Medicaid Other | Source: Home / Self Care | Attending: Family Medicine | Admitting: Family Medicine

## 2011-12-22 DIAGNOSIS — J069 Acute upper respiratory infection, unspecified: Secondary | ICD-10-CM

## 2011-12-22 DIAGNOSIS — B301 Conjunctivitis due to adenovirus: Secondary | ICD-10-CM

## 2011-12-22 MED ORDER — HYDROCOD POLST-CHLORPHEN POLST 10-8 MG/5ML PO LQCR: 5.0000 mL | Freq: Two times a day (BID) | ORAL | Status: DC

## 2011-12-22 MED ORDER — TOBRAMYCIN 0.3 % OP SOLN: 1.0000 [drp] | Freq: Four times a day (QID) | OPHTHALMIC | Status: DC

## 2011-12-22 MED ORDER — IPRATROPIUM BROMIDE 0.06 % NA SOLN: 2.0000 | Freq: Four times a day (QID) | NASAL | Status: DC

## 2011-12-22 NOTE — ED Provider Notes (Signed)
History     CSN: [id number]  Arrival date & time 12/22/11  1003   First MD Initiated Contact with Patient 12/22/11 1009      Chief Complaint  Patient presents with  . Eye Pain  . Cough    (Consider location/radiation/quality/duration/timing/severity/associated sxs/prior treatment) Patient is a 56 y.o. female presenting with cough. The history is provided by the patient.  Cough This is a new problem. The current episode started more than 1 week ago. The problem has been gradually worsening. The cough is productive of sputum. There has been no fever. Associated symptoms include rhinorrhea, sore throat and eye redness. Pertinent negatives include no shortness of breath and no wheezing. She is not a smoker.    Past Medical History  Diagnosis Date  . Hypertension   . Thyroid disease   . Chronic sinus infection     Past Surgical History  Procedure Date  . Joint replacement 06/2009    right  . Cholecystectomy 06/05/2011    Procedure: LAPAROSCOPIC CHOLECYSTECTOMY WITH INTRAOPERATIVE CHOLANGIOGRAM;  Surgeon: Mariella Saa, MD;  Location: [facility];  Service: General;  Laterality: N/A;    History reviewed. No pertinent family history.  History  Substance Use Topics  . Smoking status: Never Smoker   . Smokeless tobacco: Not on file  . Alcohol Use: Yes     Comment: occas    OB History    Grav Para Term Preterm Abortions TAB SAB Ect Mult Living                  Review of Systems  Constitutional: Negative.   HENT: Positive for congestion, sore throat, rhinorrhea and postnasal drip.   Eyes: Positive for redness.  Respiratory: Positive for cough. Negative for shortness of breath and wheezing.   Cardiovascular: Negative.   Gastrointestinal: Negative.   Skin: Negative.     Allergies  Review of patient's allergies indicates no known allergies.  Home Medications   Current Outpatient Rx  Name  Route  Sig  Dispense  Refill  . ACETAMINOPHEN 325 MG PO TABS   Oral  Take 2 tablets (650 mg total) by mouth every 6 (six) hours as needed for pain.         [name] DIPHENHYDRAMINE HCL 25 MG PO TABS   Oral   Take 25 mg by mouth every 6 (six) hours as needed. Allergies         . FEXOFENADINE HCL 180 MG PO TABS   Oral   Take 180 mg by mouth daily.           [name] LEVOTHYROXINE SODIUM 150 MCG PO TABS   Oral   Take 150 mcg by mouth daily.           [name] NAPROXEN SODIUM 220 MG PO TABS   Oral   Take 220 mg by mouth 2 (two) times daily with a meal.         . TRIAMCINOLONE ACETONIDE 0.5 % EX OINT   Topical   Apply topically 2 (two) times daily. Itching .         [name] HYDROCOD POLST-CPM POLST ER 10-8 MG/5ML PO LQCR   Oral   Take 5 mLs by mouth every 12 (twelve) hours. For cough   115 mL   0   . IPRATROPIUM BROMIDE 0.06 % NA SOLN   Nasal   Place 2 sprays into the nose 4 (four) times daily.   15 mL   1   . TOBRAMYCIN SULFATE  0.3 % OP SOLN   Right Eye   Place 1 drop into the right eye every 6 (six) hours.   5 mL   0     BP 129/86  Pulse 88  Temp 99.1 F (37.3 C) (Oral)  Resp 20  SpO2 99%  LMP 04/03/2011  Physical Exam  Nursing note and vitals reviewed. Constitutional: She is oriented to person, place, and time. She appears well-developed and well-nourished.  HENT:  Head: Normocephalic.  Right Ear: External ear normal.  Left Ear: External ear normal.  Nose: Mucosal edema and rhinorrhea present.  Mouth/Throat: Oropharynx is clear and moist.  Eyes: EOM are normal. Pupils are equal, round, and reactive to light. Right eye exhibits no discharge. Left eye exhibits no discharge. Right conjunctiva is injected. Right conjunctiva has no hemorrhage. Left conjunctiva is not injected. Left conjunctiva has no hemorrhage.  Neck: Normal range of motion. Neck supple.  Cardiovascular: Normal rate, regular rhythm, normal heart sounds and intact distal pulses.   Pulmonary/Chest: Effort normal and breath sounds normal.  Lymphadenopathy:    She has no  cervical adenopathy.  Neurological: She is alert and oriented to person, place, and time.  Skin: Skin is warm and dry.    ED Course  Procedures (including critical care time)  Labs Reviewed - No data to display No results found.   1. URI (upper respiratory infection)   2. Conjunctivitis due to Adenovirus       MDM          Linna Hoff, MD 12/22/11 1040

## 2011-12-22 NOTE — ED Notes (Addendum)
Reports right eye pain for eight days.  C/o productive green phelm cough for 8 days.  OTC medications used but no relief.  No injury to eye.  No swelling.  Denies diarrhea and vomiting.

## 2012-02-05 ENCOUNTER — Ambulatory Visit: Payer: Medicare Other

## 2012-02-19 ENCOUNTER — Telehealth: Payer: Self-pay | Admitting: *Deleted

## 2012-02-19 NOTE — Telephone Encounter (Signed)
Called patient to try and get her rescheduled for a follow up and was unable to get her but told that she is in school. Will refer to [employer]. Checked with Priscille Kluver and advised this patient tested out of the study she was trying to get her in and no contract since that time.

## 2012-04-02 ENCOUNTER — Other Ambulatory Visit: Payer: Medicare Other

## 2012-04-16 ENCOUNTER — Encounter: Payer: Self-pay | Admitting: Internal Medicine

## 2012-04-16 ENCOUNTER — Ambulatory Visit (INDEPENDENT_AMBULATORY_CARE_PROVIDER_SITE_OTHER): Payer: Medicare Other | Admitting: Internal Medicine

## 2012-04-16 ENCOUNTER — Other Ambulatory Visit: Payer: Self-pay | Admitting: Internal Medicine

## 2012-04-16 VITALS — BP 158/78 | HR 76 | Temp 98.3°F | Ht 65.0 in | Wt 333.0 lb

## 2012-04-16 DIAGNOSIS — B20 Human immunodeficiency virus [HIV] disease: Secondary | ICD-10-CM

## 2012-04-16 DIAGNOSIS — F329 Major depressive disorder, single episode, unspecified: Secondary | ICD-10-CM

## 2012-04-16 LAB — CBC WITH DIFFERENTIAL/PLATELET
Basophils Absolute: 0 10*3/uL (ref 0.0–0.1)
Basophils Relative: 0 % (ref 0–1)
Eosinophils Absolute: 0.3 10*3/uL (ref 0.0–0.7)
Eosinophils Relative: 5 % (ref 0–5)
HCT: 34.8 % — ABNORMAL LOW (ref 36.0–46.0)
MCHC: 32.2 g/dL (ref 30.0–36.0)
MCV: 81.9 fL (ref 78.0–100.0)
Monocytes Absolute: 0.5 10*3/uL (ref 0.1–1.0)
Neutro Abs: 2.4 10*3/uL (ref 1.7–7.7)
RDW: 14.6 % (ref 11.5–15.5)

## 2012-04-16 LAB — COMPLETE METABOLIC PANEL WITH GFR
Albumin: 4 g/dL (ref 3.5–5.2)
Alkaline Phosphatase: 104 U/L (ref 39–117)
GFR, Est Non African American: 63 mL/min
Glucose, Bld: 96 mg/dL (ref 70–99)
Potassium: 4.1 mEq/L (ref 3.5–5.3)
Sodium: 136 mEq/L (ref 135–145)
Total Protein: 8.2 g/dL (ref 6.0–8.3)

## 2012-04-16 NOTE — Assessment & Plan Note (Signed)
She is tearful and seems unable to express why she is upset. It appears she is most upset do to her diagnosis of HIV though certainly not new. No suicidal ideation. I did offer counseling and she was given information to contact a counselor if she chooses however this time she is not interested.

## 2012-04-16 NOTE — Assessment & Plan Note (Signed)
I discussed with her again the natural history of HIV. I did discuss that with detectable virus she is at risk for multiple different organ failure and injury to her heart and kidneys. She voiced her understanding. She does understand that she needs to start therapy, though she has not fully committed to me yet.  I am concerned that she still has poor insight since she is hopeful that her virus will somehow disappear on its own. I did educate her that detectable virus remains is on she is off of medication. I am going to check her labs today including a genotype and I did discuss with her the different medication options and likely will use Stribild. She will return in 2 weeks.

## 2012-04-16 NOTE — Progress Notes (Signed)
  Subjective:    Patient ID: Miranda Kelley, female    DOB: 1955-12-20, 57 y.o.   MRN: [id number]  HPI She comes in for follow up of her HIV. She is antiretroviral nave and has been deferring therapy despite education on the risks of deferring therapy.  She is tearful today and her appointment and has had no recent labs to her last labs in August showed detectable virus of 2000 copies and a CD4 count of 850. She remains hopeful that her virus will somehow decrease on its own.  She is interested in gastric bypass surgery though by her report, it appears they will be unwilling to do it do to her HIV. She continues to struggle with knee pain and she realizes this is secondary to her weight. She has had no hospitalizations or new issues since she was last seen here.   Review of Systems  Constitutional: Negative for appetite change and fatigue.  HENT: Negative for sore throat and trouble swallowing.   Respiratory: Negative for shortness of breath.   Cardiovascular: Negative for chest pain, palpitations and leg swelling.  Gastrointestinal: Negative for nausea, abdominal pain and diarrhea.  Musculoskeletal: Positive for arthralgias. Negative for myalgias and joint swelling.  Neurological: Negative for dizziness, light-headedness and headaches.  Psychiatric/Behavioral: Positive for dysphoric mood.       Objective:   Physical Exam  Constitutional: She appears well-developed and well-nourished.  Morbidly obese. Tearful  Cardiovascular: Normal rate and regular rhythm.  Exam reveals no gallop and no friction rub.   No murmur heard. Pulmonary/Chest: Effort normal and breath sounds normal. No respiratory distress. She has no wheezes. She has no rales.  Lymphadenopathy:    She has no cervical adenopathy.          Assessment & Plan:

## 2012-04-19 LAB — HIV-1 GENOTYPR PLUS

## 2012-04-30 ENCOUNTER — Ambulatory Visit (INDEPENDENT_AMBULATORY_CARE_PROVIDER_SITE_OTHER): Payer: Medicare Other | Admitting: Internal Medicine

## 2012-04-30 ENCOUNTER — Encounter: Payer: Self-pay | Admitting: Internal Medicine

## 2012-04-30 VITALS — BP 138/88 | HR 83 | Temp 97.3°F | Ht 65.0 in | Wt 332.0 lb

## 2012-04-30 DIAGNOSIS — B20 Human immunodeficiency virus [HIV] disease: Secondary | ICD-10-CM

## 2012-04-30 MED ORDER — ELVITEG-COBIC-EMTRICIT-TENOFDF 150-150-200-300 MG PO TABS: 1.0000 | ORAL_TABLET | Freq: Every day | ORAL | Status: DC

## 2012-04-30 NOTE — Progress Notes (Signed)
  Subjective:    Patient ID: Miranda Kelley, female    DOB: 1955/11/18, 57 y.o.   MRN: [id number]  HPI Comes in for follow up.  Interested in treatment and last visit 2 weeks ago discussed options and got repeat genotype.  She is wild type and interested in starting Stribild.  She is interested in gastric bypass surgery but told she can't with current HIV untreated.  Ready to take meds daily.     Review of Systems  Constitutional: Negative for fever and chills.  Gastrointestinal: Negative for nausea, abdominal pain and diarrhea.  Psychiatric/Behavioral: Negative for dysphoric mood. The patient is not nervous/anxious.        Objective:   Physical Exam  Constitutional: She appears well-developed and well-nourished. No distress.  morbildy obese  Cardiovascular: Normal rate, regular rhythm and normal heart sounds.  Exam reveals no gallop and no friction rub.   No murmur heard.         Assessment & Plan:

## 2012-04-30 NOTE — Assessment & Plan Note (Signed)
Starting stribild. Seen by the pharmacist as well.  Follow up in 3 weeks for labs and 4-5 weeks with me.  Told to call with any problems.

## 2012-04-30 NOTE — Progress Notes (Signed)
HPI: Miranda Kelley is a 57 y.o. female with HIV diagnosed in 2010 now presenting for initiation of ART.  Allergies: No Known Allergies  Vitals: Temp: 97.3 F (36.3 C) (04/29 1454) Temp src: Oral (04/29 1454) BP: 138/88 mmHg (04/29 1454) Pulse Rate: 83 (04/29 1454)  Past Medical History: Past Medical History  Diagnosis Date  . Hypertension   . Thyroid disease   . Chronic sinus infection     Social History: History   Social History  . Marital Status: Divorced    Spouse Name: N/A    Number of Children: N/A  . Years of Education: N/A   Social History Main Topics  . Smoking status: Never Smoker   . Smokeless tobacco: Never Used  . Alcohol Use: Yes     Comment: occas  . Drug Use: No  . Sexually Active: Yes     Comment: pt. given condoms   Other Topics Concern  . None   Social History Narrative  . None    Current Regimen: Treatment naive   Labs: HIV 1 RNA Quant (copies/mL)  Date Value  04/16/2012 1384*  08/24/2011 2935*  01/06/2011 1317*     CD4 T Cell Abs (cmm)  Date Value  04/16/2012 880   08/24/2011 850   01/06/2011 1180      Hep B S Ab (no units)  Date Value  03/18/2009 NEG      Hepatitis B Surface Ag (no units)  Date Value  03/18/2009 NEG      HCV Ab (no units)  Date Value  03/18/2009 NEG     CrCl: Estimated Creatinine Clearance: 93.6 ml/min (by C-G formula based on Cr of 1).  Lipids:    Component Value Date/Time   CHOL 155 07/28/2010 1429   TRIG 92 07/28/2010 1429   HDL 37* 07/28/2010 1429   CHOLHDL 4.2 07/28/2010 1429   VLDL 18 07/28/2010 1429   LDLCALC 100* 07/28/2010 1429    Assessment: Patient presents now ready to start ART after she was denied for gastric bypass surgery due to uncontrolled HIV. We discussed Stribild and importance of adherence. She seemed to understand this and should be a good candidate for treatment at this time.   We also discussed her knee and ankle pain. Her PCP prescribes her Percocet for this, but she said this  alone does not control her pain. She has been supplementing this with ibuprofen OTC, she's been takin both Motrin and Advil brand. She was unaware these were both ibuprofen and taking a total of ~7 tabs at a time (1400mg ). I told her the absolute max at one time was 800mg  and even this should much should not done for long without her PCP knowing.    Recommendations: - Start Stribild one tablet daily - Decrease ibuprofen use and speak to PCP  Drue Stager, PharmD [facility] 04/30/2012, 3:28 PM

## 2012-05-21 ENCOUNTER — Other Ambulatory Visit: Payer: Medicare Other

## 2012-06-04 ENCOUNTER — Encounter: Payer: Self-pay | Admitting: Internal Medicine

## 2012-06-04 ENCOUNTER — Ambulatory Visit (INDEPENDENT_AMBULATORY_CARE_PROVIDER_SITE_OTHER): Payer: Medicare Other | Admitting: Internal Medicine

## 2012-06-04 VITALS — BP 123/84 | HR 79 | Temp 97.6°F | Wt 337.0 lb

## 2012-06-04 DIAGNOSIS — B20 Human immunodeficiency virus [HIV] disease: Secondary | ICD-10-CM

## 2012-06-04 DIAGNOSIS — R21 Rash and other nonspecific skin eruption: Secondary | ICD-10-CM

## 2012-06-04 LAB — CBC WITH DIFFERENTIAL/PLATELET
Basophils Absolute: 0 10*3/uL (ref 0.0–0.1)
Eosinophils Absolute: 0.3 10*3/uL (ref 0.0–0.7)
Lymphocytes Relative: 57 % — ABNORMAL HIGH (ref 12–46)
Lymphs Abs: 3 10*3/uL (ref 0.7–4.0)
MCH: 26.9 pg (ref 26.0–34.0)
Neutrophils Relative %: 30 % — ABNORMAL LOW (ref 43–77)
Platelets: 376 10*3/uL (ref 150–400)
RBC: 4.09 MIL/uL (ref 3.87–5.11)
RDW: 15.2 % (ref 11.5–15.5)
WBC: 5.2 10*3/uL (ref 4.0–10.5)

## 2012-06-04 NOTE — Addendum Note (Signed)
Addended bySteva Colder on: 06/04/2012 03:44 PM   Modules accepted: Orders

## 2012-06-04 NOTE — Progress Notes (Addendum)
HPI: Miranda Kelley is a 57 y.o. female with HIV since 2010 who presents for follow up after initiating treatment with stribild at her last appointment.  Allergies: No Known Allergies  Vitals: Temp: 97.6 F (36.4 C) (06/03 1434) Temp src: Oral (06/03 1434) BP: 123/84 mmHg (06/03 1434) Pulse Rate: 79 (06/03 1434)  Past Medical History: Past Medical History  Diagnosis Date  . Hypertension   . Thyroid disease   . Chronic sinus infection     Social History: History   Social History  . Marital Status: Divorced    Spouse Name: N/A    Number of Children: N/A  . Years of Education: N/A   Social History Main Topics  . Smoking status: Never Smoker   . Smokeless tobacco: Never Used  . Alcohol Use: Yes     Comment: occas  . Drug Use: No  . Sexually Active: Yes     Comment: pt. given condoms   Other Topics Concern  . None   Social History Narrative  . None    Current Regimen: Stribild  Labs: HIV 1 RNA Quant (copies/mL)  Date Value  04/16/2012 1384*  08/24/2011 2935*  01/06/2011 1317*     CD4 T Cell Abs (cmm)  Date Value  04/16/2012 880   08/24/2011 850   01/06/2011 1180      Hep B S Ab (no units)  Date Value  03/18/2009 NEG      Hepatitis B Surface Ag (no units)  Date Value  03/18/2009 NEG      HCV Ab (no units)  Date Value  03/18/2009 NEG    Lipids:    Component Value Date/Time   CHOL 155 07/28/2010 1429   TRIG 92 07/28/2010 1429   HDL 37* 07/28/2010 1429   CHOLHDL 4.2 07/28/2010 1429   VLDL 18 07/28/2010 1429   LDLCALC 100* 07/28/2010 1429    Assessment: Patient is tolerating the medication very well and expresses excellent adherence without a single missed dose since starting. She knows importance of continued adherence. She was not regularly taking her medication with food before and we discussed trying to eat something with it. Also gave patient a medication keyring to keep a backup pill in.   Recommendations: - Continue current ART ad follow up lab  results to ensure appropriate response to therapy   Drue Stager, PharmD [facility] 06/04/2012, 3:58 PM

## 2012-06-04 NOTE — Assessment & Plan Note (Signed)
She seems to be tolerating her medication well and therefore I will check her labs today and if okay will followup in 3 months. She will be called for any concerns.

## 2012-06-04 NOTE — Progress Notes (Signed)
  Subjective:    Patient ID: Miranda Kelley, female    DOB: 22-Feb-1955, 57 y.o.   MRN: [id number]  HPI He comes in for followup of her HIV.  Her last CD4 count was 880 with a  Detectable virus of about 1700. She had previously been reluctant to start antiretroviral therapy however she was interested in gastric bypass and was told she needs to be on medication and suppressed in order or them to do the surgery. She then was started on Stribild by me. She now comes in about one month and do her course and is tolerating it well. She does have a rash on her left arm though on the sun exposed area and did have a menstrual period which has been irregular in the past. She has no complaints though the medication including no nausea, diarrhea.   Review of Systems  Constitutional: Negative for fatigue and unexpected weight change.  HENT: Negative for sore throat and trouble swallowing.   Respiratory: Negative for cough and shortness of breath.   Cardiovascular: Negative for chest pain.  Gastrointestinal: Negative for nausea and diarrhea.  Musculoskeletal: Positive for arthralgias. Negative for myalgias.  Skin: Negative for rash.  Neurological: Negative for dizziness and headaches.  Hematological: Negative for adenopathy.  Psychiatric/Behavioral: Negative for dysphoric mood.       Objective:   Physical Exam  Constitutional: She appears well-developed and well-nourished. No distress.  HENT:  Mouth/Throat: No oropharyngeal exudate.  Eyes: No scleral icterus.  Cardiovascular: Normal rate, regular rhythm and normal heart sounds.  Exam reveals no gallop and no friction rub.   No murmur heard. Pulmonary/Chest: Effort normal and breath sounds normal. No respiratory distress. She has no wheezes. She has no rales.  Lymphadenopathy:    She has no cervical adenopathy.          Assessment & Plan:

## 2012-06-04 NOTE — Assessment & Plan Note (Signed)
The rash she has has not consistent with a drug rash and it is not otherwise concerning. She will try over-the-counter lotion.

## 2012-06-05 LAB — COMPLETE METABOLIC PANEL WITH GFR
ALT: 13 U/L (ref 0–35)
AST: 15 U/L (ref 0–37)
Alkaline Phosphatase: 100 U/L (ref 39–117)
GFR, Est Non African American: 58 mL/min — ABNORMAL LOW
Glucose, Bld: 104 mg/dL — ABNORMAL HIGH (ref 70–99)
Sodium: 136 mEq/L (ref 135–145)
Total Bilirubin: 0.3 mg/dL (ref 0.3–1.2)
Total Protein: 8.2 g/dL (ref 6.0–8.3)

## 2012-06-05 LAB — T-HELPER CELL (CD4) - (RCID CLINIC ONLY): CD4 T Cell Abs: 1020 uL (ref 400–2700)

## 2012-06-05 LAB — HIV-1 RNA QUANT-NO REFLEX-BLD
HIV 1 RNA Quant: <20 copies/mL (ref <20)
HIV-1 RNA Quant, Log: <1.3 {Log} (ref <1.30)

## 2012-06-20 ENCOUNTER — Telehealth: Payer: Self-pay | Admitting: *Deleted

## 2012-06-20 NOTE — Telephone Encounter (Signed)
Requested pt call [facility] to schedule a PAP smear appt.

## 2012-07-03 ENCOUNTER — Encounter: Payer: Self-pay | Admitting: *Deleted

## 2012-07-10 ENCOUNTER — Telehealth: Payer: Self-pay | Admitting: Licensed Clinical Social Worker

## 2012-07-10 NOTE — Telephone Encounter (Signed)
Patient wants a clearance letter to have weight loss surgery in August. She have the gastric sleeve done. It is at [facility] Dr. Carola Frost.

## 2012-07-15 ENCOUNTER — Encounter: Payer: Self-pay | Admitting: Licensed Clinical Social Worker

## 2012-07-15 NOTE — Telephone Encounter (Signed)
I will type it and put it in your box to sign

## 2012-07-15 NOTE — Telephone Encounter (Signed)
We can write a clearance letter that there is no contraindications to surgery from and infectious disease standpoint.

## 2012-09-11 ENCOUNTER — Encounter (HOSPITAL_COMMUNITY): Payer: Self-pay

## 2012-09-11 ENCOUNTER — Other Ambulatory Visit: Payer: Self-pay

## 2012-09-11 DIAGNOSIS — R0602 Shortness of breath: Secondary | ICD-10-CM | POA: Insufficient documentation

## 2012-09-11 DIAGNOSIS — Z791 Long term (current) use of non-steroidal anti-inflammatories (NSAID): Secondary | ICD-10-CM | POA: Insufficient documentation

## 2012-09-11 DIAGNOSIS — Z79899 Other long term (current) drug therapy: Secondary | ICD-10-CM | POA: Insufficient documentation

## 2012-09-11 DIAGNOSIS — I10 Essential (primary) hypertension: Secondary | ICD-10-CM | POA: Insufficient documentation

## 2012-09-11 DIAGNOSIS — R0989 Other specified symptoms and signs involving the circulatory and respiratory systems: Secondary | ICD-10-CM | POA: Insufficient documentation

## 2012-09-11 DIAGNOSIS — E079 Disorder of thyroid, unspecified: Secondary | ICD-10-CM | POA: Insufficient documentation

## 2012-09-11 DIAGNOSIS — R0609 Other forms of dyspnea: Secondary | ICD-10-CM | POA: Insufficient documentation

## 2012-09-11 NOTE — ED Notes (Addendum)
Pt c/o generalized weakness x2 months, pt reports she was scheduled for surgery on Monday to have the sleeve gastric bypass, they cancelled the surgery because her thyroid levels were elevated. Pt states she ran out of her thyroid medication 6 days ago

## 2012-09-12 ENCOUNTER — Emergency Department (HOSPITAL_COMMUNITY)
Admission: EM | Admit: 2012-09-12 | Discharge: 2012-09-12 | Disposition: A | Discharge status: Home / Self Care | Payer: Medicare Other | Attending: Emergency Medicine | Admitting: Emergency Medicine

## 2012-09-12 ENCOUNTER — Emergency Department (HOSPITAL_COMMUNITY): Payer: Medicare Other

## 2012-09-12 DIAGNOSIS — R0600 Dyspnea, unspecified: Secondary | ICD-10-CM

## 2012-09-12 LAB — COMPREHENSIVE METABOLIC PANEL
ALT: 18 U/L (ref 0–35)
AST: 21 U/L (ref 0–37)
Albumin: 3.6 g/dL (ref 3.5–5.2)
CO2: 26 mEq/L (ref 19–32)
Calcium: 10.3 mg/dL (ref 8.4–10.5)
GFR calc non Af Amer: 57 mL/min — ABNORMAL LOW (ref >90)
Sodium: 139 mEq/L (ref 135–145)

## 2012-09-12 LAB — CBC
MCH: 27.7 pg (ref 26.0–34.0)
MCV: 87 fL (ref 78.0–100.0)
Platelets: 348 10*3/uL (ref 150–400)
RBC: 4.08 MIL/uL (ref 3.87–5.11)
RDW: 15.4 % (ref 11.5–15.5)

## 2012-09-12 NOTE — ED Notes (Signed)
Pt presents with weakness that has gotten increasingly worse over the past 2 months.  States she was supposed to have surgery for a gastric sleeve on Monday but they would not operate because they found her thyroid levels to be elevated.  Pt states she has felt short of breath as well, shortness of breath is worse with movement, occasional chest pain noted as well.

## 2012-09-12 NOTE — ED Provider Notes (Signed)
CSN: [id number]     Arrival date & time 09/11/12  2335 History   First MD Initiated Contact with Patient 09/12/12 0050     Chief Complaint  Patient presents with  . Weakness   (Consider location/radiation/quality/duration/timing/severity/associated sxs/prior Treatment) Patient is a 57 y.o. female presenting with weakness. The history is provided by the patient.  Weakness   patient here complaining of weakness and shortness of breath x2 months. Patient scheduled for gastric banding surgery but was unable to have this done last week to because of abnormalities with her thyroid function test. She denies any fever or chills. No cough. Notes chronic lower extremity edema but denies any unilateral swelling. No pleuritic chest pain. Hers dyspnea has been persistent and is worse with exertion. She is morbidly obese. She states her shortness of breath has been stable. No treatment used for this.  Past Medical History  Diagnosis Date  . Hypertension   . Thyroid disease   . Chronic sinus infection    Past Surgical History  Procedure Laterality Date  . Joint replacement  06/2009    right  . Cholecystectomy  06/05/2011    Procedure: LAPAROSCOPIC CHOLECYSTECTOMY WITH INTRAOPERATIVE CHOLANGIOGRAM;  Surgeon: Mariella Saa, MD;  Location: [facility];  Service: General;  Laterality: N/A;   History reviewed. No pertinent family history. History  Substance Use Topics  . Smoking status: Never Smoker   . Smokeless tobacco: Never Used  . Alcohol Use: Yes     Comment: occas   OB History   Grav Para Term Preterm Abortions TAB SAB Ect Mult Living                 Review of Systems  Neurological: Positive for weakness.  All other systems reviewed and are negative.    Allergies  Review of patient's allergies indicates no known allergies.  Home Medications   Current Outpatient Rx  Name  Route  Sig  Dispense  Refill  . diphenhydrAMINE (BENADRYL) 25 MG tablet   Oral   Take 25 mg by mouth every 6  (six) hours as needed. Allergies         . elvitegravir-cobicistat-emtricitabine-tenofovir (STRIBILD) 150-150-200-300 MG TABS   Oral   Take 1 tablet by mouth daily with breakfast.   30 tablet   5   . fexofenadine (ALLEGRA) 180 MG tablet   Oral   Take 180 mg by mouth daily.           [name] ipratropium (ATROVENT) 0.06 % nasal spray   Nasal   Place 2 sprays into the nose 4 (four) times daily.   15 mL   1   . levothyroxine (SYNTHROID, LEVOTHROID) 150 MCG tablet   Oral   Take 150 mcg by mouth daily.           . naproxen sodium (ANAPROX) 220 MG tablet   Oral   Take 220 mg by mouth 2 (two) times daily with a meal.         . oxyCODONE-acetaminophen (PERCOCET) 10-325 MG per tablet   Oral   Take 1 tablet by mouth every 4 (four) hours as needed for pain. PCP         . tobramycin (TOBREX) 0.3 % ophthalmic solution   Right Eye   Place 1 drop into the right eye every 6 (six) hours.   5 mL   0   . triamcinolone ointment (KENALOG) 0.5 %   Topical   Apply topically 2 (two) times daily. Itching .  BP 115/65  Pulse 62  Temp(Src) 97.9 F (36.6 C) (Oral)  Resp 19  Ht 5\' 6"  (1.676 m)  Wt 304 lb (137.893 kg)  BMI 49.09 kg/m2  SpO2 99%  LMP 04/03/2011 Physical Exam  Nursing note and vitals reviewed. Constitutional: She is oriented to person, place, and time. She appears well-developed and well-nourished.  Non-toxic appearance. No distress.  HENT:  Head: Normocephalic and atraumatic.  Eyes: Conjunctivae, EOM and lids are normal. Pupils are equal, round, and reactive to light.  Neck: Normal range of motion. Neck supple. No tracheal deviation present. No mass present.  Cardiovascular: Normal rate, regular rhythm and normal heart sounds.  Exam reveals no gallop.   No murmur heard. Pulmonary/Chest: Effort normal and breath sounds normal. No stridor. No respiratory distress. She has no decreased breath sounds. She has no wheezes. She has no rhonchi. She has no rales.   Abdominal: Soft. Normal appearance and bowel sounds are normal. She exhibits no distension. There is no tenderness. There is no rebound and no CVA tenderness.  Musculoskeletal: Normal range of motion. She exhibits no edema and no tenderness.  Neurological: She is alert and oriented to person, place, and time. She has normal strength. No cranial nerve deficit or sensory deficit. GCS eye subscore is 4. GCS verbal subscore is 5. GCS motor subscore is 6.  Skin: Skin is warm and dry. No abrasion and no rash noted.  Psychiatric: She has a normal mood and affect. Her speech is normal and behavior is normal.    ED Course  Procedures (including critical care time) Labs Review Labs Reviewed  CBC  COMPREHENSIVE METABOLIC PANEL   Imaging Review Dg Chest 2 View  09/12/2012   *RADIOLOGY REPORT*  Clinical Data: Shortness of breath, fatigue, chest pain, weakness. History of thyroid disease.  CHEST - 2 VIEW  Comparison: 04/10/2011  Findings: Borderline heart size with normal pulmonary vascularity. No focal airspace disease in the lungs.  No blunting of costophrenic angles.  No pneumothorax.  Mediastinal contours appear intact.  No significant change since previous study.  IMPRESSION: No evidence of active pulmonary disease.   Original Report Authenticated By: Burman Nieves, M.D.    MDM  No diagnosis found.  Date: 09/12/2012  Rate: 68  Rhythm: normal sinus rhythm  QRS Axis: normal  Intervals: normal  ST/T Wave abnormalities: normal  Conduction Disutrbances:none  Narrative Interpretation:   Old EKG Reviewed: unchanged   3:20 AM Patient had negative workup here. Suspect her dyspnea is from her obesity. Chest x-ray is negative. No signs of CHF. No concern for PE. Troponin negative. Instructed patient to followup with her Dr. for adjustment of her thyroid medication   Toy Baker, MD 09/12/12 [phone]

## 2012-11-12 ENCOUNTER — Other Ambulatory Visit: Payer: Self-pay | Admitting: Internal Medicine

## 2012-11-27 ENCOUNTER — Telehealth: Payer: Self-pay | Admitting: *Deleted

## 2012-11-27 NOTE — Telephone Encounter (Signed)
Requested pt call for MD/Lab and pap smear appts.

## 2013-01-02 HISTORY — PX: LAPAROSCOPIC GASTRIC SLEEVE RESECTION: SHX5895

## 2013-04-09 ENCOUNTER — Other Ambulatory Visit: Payer: Medicare Other

## 2013-04-14 ENCOUNTER — Other Ambulatory Visit: Payer: Medicare Other

## 2013-04-23 ENCOUNTER — Encounter: Payer: Self-pay | Admitting: Internal Medicine

## 2013-04-23 ENCOUNTER — Ambulatory Visit (INDEPENDENT_AMBULATORY_CARE_PROVIDER_SITE_OTHER): Payer: Medicare HMO | Admitting: Internal Medicine

## 2013-04-23 VITALS — BP 107/73 | HR 85 | Temp 97.9°F | Ht 65.0 in | Wt 260.5 lb

## 2013-04-23 DIAGNOSIS — Z79899 Other long term (current) drug therapy: Secondary | ICD-10-CM

## 2013-04-23 DIAGNOSIS — B20 Human immunodeficiency virus [HIV] disease: Secondary | ICD-10-CM

## 2013-04-23 DIAGNOSIS — Z113 Encounter for screening for infections with a predominantly sexual mode of transmission: Secondary | ICD-10-CM

## 2013-04-23 LAB — COMPLETE METABOLIC PANEL WITH GFR
ALK PHOS: 96 U/L (ref 39–117)
ALT: 10 U/L (ref 0–35)
AST: 14 U/L (ref 0–37)
Albumin: 4 g/dL (ref 3.5–5.2)
BILIRUBIN TOTAL: 0.4 mg/dL (ref 0.2–1.2)
BUN: 12 mg/dL (ref 6–23)
CO2: 27 meq/L (ref 19–32)
CREATININE: 0.74 mg/dL (ref 0.50–1.10)
Calcium: 9.8 mg/dL (ref 8.4–10.5)
Chloride: 106 mEq/L (ref 96–112)
GLUCOSE: 84 mg/dL (ref 70–99)
Potassium: 4.1 mEq/L (ref 3.5–5.3)
Sodium: 140 mEq/L (ref 135–145)
Total Protein: 7.1 g/dL (ref 6.0–8.3)

## 2013-04-23 LAB — CBC WITH DIFFERENTIAL/PLATELET
BASOS PCT: 0 % (ref 0–1)
Basophils Absolute: 0 10*3/uL (ref 0.0–0.1)
Eosinophils Absolute: 0.3 10*3/uL (ref 0.0–0.7)
Eosinophils Relative: 6 % — ABNORMAL HIGH (ref 0–5)
HCT: 35.5 % — ABNORMAL LOW (ref 36.0–46.0)
HEMOGLOBIN: 11.6 g/dL — AB (ref 12.0–15.0)
LYMPHS ABS: 2.6 10*3/uL (ref 0.7–4.0)
LYMPHS PCT: 54 % — AB (ref 12–46)
MCH: 28 pg (ref 26.0–34.0)
MCHC: 32.7 g/dL (ref 30.0–36.0)
MCV: 85.5 fL (ref 78.0–100.0)
MONOS PCT: 8 % (ref 3–12)
Monocytes Absolute: 0.4 10*3/uL (ref 0.1–1.0)
NEUTROS ABS: 1.6 10*3/uL — AB (ref 1.7–7.7)
NEUTROS PCT: 32 % — AB (ref 43–77)
Platelets: 372 10*3/uL (ref 150–400)
RBC: 4.15 MIL/uL (ref 3.87–5.11)
RDW: 14.9 % (ref 11.5–15.5)
WBC: 4.9 10*3/uL (ref 4.0–10.5)

## 2013-04-23 LAB — LIPID PANEL
CHOLESTEROL: 165 mg/dL (ref 0–200)
HDL: 41 mg/dL (ref >39)
LDL Cholesterol: 100 mg/dL — ABNORMAL HIGH (ref 0–99)
TRIGLYCERIDES: 122 mg/dL (ref <150)
Total CHOL/HDL Ratio: 4 Ratio
VLDL: 24 mg/dL (ref 0–40)

## 2013-04-23 NOTE — Assessment & Plan Note (Signed)
Labs today.  RTC 4 months if reassuring.

## 2013-04-23 NOTE — Progress Notes (Signed)
  Subjective:    Patient ID: Miranda Kelley, female    DOB: [birth year], 58 y.o.   MRN: [id number]  HPI  He comes in for followup of her HIV after a 1 year abscence.  She started Stribild and viral load went to undetectable. She had previously been reluctant to start antiretroviral therapy however she was interested in gastric bypass and was told she needs to be on medication and suppressed in order or them to do the surgery. She did undergo gastric bypass and has lost 40 lbs. She had also lost 40 lbs before surgery.  She has no complaints though the medication including no nausea, diarrhea.   Review of Systems  Constitutional: Negative for fatigue and unexpected weight change.  HENT: Negative for sore throat and trouble swallowing.   Respiratory: Negative for cough and shortness of breath.   Cardiovascular: Negative for chest pain.  Gastrointestinal: Negative for nausea and diarrhea.  Musculoskeletal: Positive for arthralgias. Negative for myalgias.  Skin: Negative for rash.  Neurological: Negative for dizziness and headaches.  Hematological: Negative for adenopathy.  Psychiatric/Behavioral: Negative for dysphoric mood.       Objective:   Physical Exam  Constitutional: She appears well-developed and well-nourished. No distress.  HENT:  Mouth/Throat: No oropharyngeal exudate.  Eyes: No scleral icterus.  Cardiovascular: Normal rate, regular rhythm and normal heart sounds.  Exam reveals no gallop and no friction rub.   No murmur heard. Pulmonary/Chest: Effort normal and breath sounds normal. No respiratory distress. She has no wheezes. She has no rales.  Lymphadenopathy:    She has no cervical adenopathy.          Assessment & Plan:

## 2013-04-24 LAB — T-HELPER CELL (CD4) - (RCID CLINIC ONLY)
CD4 T CELL HELPER: 41 % (ref 33–55)
CD4 T Cell Abs: 1160 /uL (ref 400–2700)

## 2013-04-24 LAB — RPR

## 2013-04-25 LAB — HIV-1 RNA QUANT-NO REFLEX-BLD
HIV 1 RNA Quant: <20 copies/mL (ref <20)
HIV-1 RNA Quant, Log: <1.3 {Log} (ref <1.30)

## 2013-05-21 ENCOUNTER — Other Ambulatory Visit: Payer: Self-pay | Admitting: Internal Medicine

## 2013-06-11 ENCOUNTER — Other Ambulatory Visit: Payer: Self-pay | Admitting: Family Medicine

## 2013-06-11 ENCOUNTER — Encounter: Payer: Self-pay | Admitting: Gastroenterology

## 2013-06-11 DIAGNOSIS — Z780 Asymptomatic menopausal state: Secondary | ICD-10-CM

## 2013-06-11 DIAGNOSIS — E2839 Other primary ovarian failure: Secondary | ICD-10-CM

## 2013-06-11 DIAGNOSIS — Z1231 Encounter for screening mammogram for malignant neoplasm of breast: Secondary | ICD-10-CM

## 2013-06-18 ENCOUNTER — Ambulatory Visit: Payer: Medicare HMO

## 2013-06-18 ENCOUNTER — Ambulatory Visit
Admission: RE | Admit: 2013-06-18 | Discharge: 2013-06-18 | Disposition: A | Discharge status: Home / Self Care | Payer: Commercial Managed Care - HMO | Source: Ambulatory Visit | Attending: Family Medicine | Admitting: Family Medicine

## 2013-06-18 DIAGNOSIS — E2839 Other primary ovarian failure: Secondary | ICD-10-CM

## 2013-06-18 DIAGNOSIS — Z1231 Encounter for screening mammogram for malignant neoplasm of breast: Secondary | ICD-10-CM

## 2013-06-25 ENCOUNTER — Ambulatory Visit: Payer: Self-pay | Admitting: Nurse Practitioner

## 2013-07-03 ENCOUNTER — Ambulatory Visit: Payer: Commercial Managed Care - HMO | Admitting: Nurse Practitioner

## 2013-07-08 ENCOUNTER — Ambulatory Visit: Payer: Self-pay | Admitting: Internal Medicine

## 2013-07-10 ENCOUNTER — Ambulatory Visit (INDEPENDENT_AMBULATORY_CARE_PROVIDER_SITE_OTHER): Payer: Medicare HMO | Admitting: Interventional Cardiology

## 2013-07-10 ENCOUNTER — Encounter: Payer: Self-pay | Admitting: Interventional Cardiology

## 2013-07-10 VITALS — BP 118/66 | HR 59 | Ht 65.0 in | Wt 250.0 lb

## 2013-07-10 DIAGNOSIS — I10 Essential (primary) hypertension: Secondary | ICD-10-CM

## 2013-07-10 DIAGNOSIS — R0602 Shortness of breath: Secondary | ICD-10-CM

## 2013-07-10 DIAGNOSIS — E039 Hypothyroidism, unspecified: Secondary | ICD-10-CM

## 2013-07-10 NOTE — Patient Instructions (Signed)
Your physician recommends that you continue on your current medications as directed. Please refer to the Current Medication list given to you today.  Your physician has requested that you have an exercise tolerance test. For further information please visit [email or web address]. Please also follow instruction sheet, as given.   Your physician recommends that you schedule a follow-up appointment as needed  

## 2013-07-10 NOTE — Progress Notes (Signed)
Patient ID: Miranda Kelley, female   DOB: Nov 04, 1955, 58 y.o.   MRN: [id number]   Date: 07/10/2013 ID: Miranda GrahamJoann Kelley, DOB Nov 04, 1955, MRN [id number] PCP: Karie ChimeraEESE,BETTI D, MD  Reason: Family history of heart disease and shortness of breath on exertion  ASSESSMENT;  1. Dyspnea on exertion 2. Obesity 3. Family history of heart disease including CAD (father) 4. Hypertension  PLAN:  1. Exercise treadmill test. Perhaps a pharmacologic nuclear study or exercise treadmill nuclear study would be more appropriate that the patient refuses. She will be at slightly higher risk for false positive EKG stress test but we will proceed in that direction given limitations.  SUBJECTIVE: Miranda Kelley is a 58 y.o. female who is here for cardiac evaluation. She is concerned she may have heart disease. She suffers mild exertional dyspnea. She is obese. There is a family history of heart disease. Her father Toney Rakesorth Ruane.of ischemic cardiomyopathy and heart failure. Her mother has congestive heart failure does have a prior myocardial infarction. She denies chest discomfort. There is no orthopnea or PND. She denies edema. She has pain to the left knee and left foot that limit her physical activity. Since recent bariatric surgery she has lost 90 pounds. Dyspnea is persisting despite exercise on treadmill several days per week. She denies tobacco use.   No Known Allergies  Current Outpatient Prescriptions on File Prior to Visit  Medication Sig Dispense Refill  . ALPRAZolam (XANAX) 1 MG tablet Take 1 mg by mouth 2 (two) times daily.       [name]. ipratropium (ATROVENT) 0.06 % nasal spray Place 2 sprays into the nose 4 (four) times daily.  15 mL  1  . levothyroxine (SYNTHROID, LEVOTHROID) 150 MCG tablet Take 150 mcg by mouth daily.        [name]. oxyCODONE-acetaminophen (PERCOCET) 10-325 MG per tablet Take 1 tablet by mouth every 4 (four) hours as needed for pain. PCP      . STRIBILD 150-150-200-300 MG TABS tablet TAKE 1 TABLET BY MOUTH  DAILY WITH BREAKFAST.  30 tablet  5   No current facility-administered medications on file prior to visit.    Past Medical History  Diagnosis Date  . Hypertension   . Thyroid disease   . Chronic sinus infection     Past Surgical History  Procedure Laterality Date  . Joint replacement  06/2009    right  . Cholecystectomy  06/05/2011    Procedure: LAPAROSCOPIC CHOLECYSTECTOMY WITH INTRAOPERATIVE CHOLANGIOGRAM;  Surgeon: Mariella SaaBenjamin T Hoxworth, MD;  Location: [facility];  Service: General;  Laterality: N/A;    History   Social History  . Marital Status: Divorced    Spouse Name: N/A    Number of Children: N/A  . Years of Education: N/A   Occupational History  . Not on file.   Social History Main Topics  . Smoking status: Never Smoker   . Smokeless tobacco: Never Used  . Alcohol Use: Yes     Comment: occas  . Drug Use: No  . Sexual Activity: Yes     Comment: pt. given condoms   Other Topics Concern  . Not on file   Social History Narrative  . No narrative on file    Family History  Problem Relation Age of Onset  . Heart failure Mother   . Heart disease Mother     ROS: Denies dysphagia. Denies aspiration, history of pneumonia, fever, chills, liver disease, edema, syncope, orthopnea, palpitations, and claudication. No history of COPD or lung disease.. There is  left lower extremity orthopedic abnormality. Other systems negative for complaints.  OBJECTIVE: BP 118/66  Pulse 59  Ht 5\' 5"  (1.651 m)  Wt 250 lb (113.399 kg)  BMI 41.60 kg/m2  LMP 04/03/2011,  General: No acute distress, obese with significant weight loss of 90 pounds over the past 6 months HEENT: normal no jaundice or pallor Neck: JVD flat. Carotids absent Chest: Clear Cardiac: Murmur: None. Gallop: S4. Rhythm: Regular. Other: Normal Abdomen: Bruit: Absent. Pulsation: None Extremities: Edema: Absent. Pulses: 2+ Neuro: Normal Psych: Normal  ECG: Sinus bradycardia at 59 beats per minute and nonspecific T  wave flattening

## 2013-08-04 ENCOUNTER — Encounter: Payer: Medicare HMO | Admitting: Gastroenterology

## 2013-08-07 ENCOUNTER — Telehealth (HOSPITAL_COMMUNITY): Payer: Self-pay

## 2013-08-07 NOTE — Telephone Encounter (Signed)
Encounter complete. 

## 2013-08-12 ENCOUNTER — Ambulatory Visit (HOSPITAL_COMMUNITY)
Admission: RE | Admit: 2013-08-12 | Discharge: 2013-08-12 | Disposition: A | Discharge status: Home / Self Care | Payer: Medicare HMO | Source: Ambulatory Visit | Attending: Interventional Cardiology | Admitting: Interventional Cardiology

## 2013-08-12 ENCOUNTER — Other Ambulatory Visit: Payer: Medicare Other

## 2013-08-12 DIAGNOSIS — R0602 Shortness of breath: Secondary | ICD-10-CM | POA: Diagnosis not present

## 2013-08-12 NOTE — Procedures (Signed)
Exercise Treadmill Test  Pre-Exercise Testing Evaluation   Test  Exercise Tolerance Test Ordering MD: Verdis PrimeHenry Smith III, MD  Interpreting MD:   Unique Test No:1  Treadmill:  1  Indication for ETT: chest pain - rule out ischemia  Contraindication to ETT: No   Stress Modality: exercise - treadmill  Cardiac Imaging Performed: non   Protocol: standard Bruce - maximal  Max BP:  163/80  Max MPHR (bpm):  163 85% MPR (bpm):  138  MPHR obtained (bpm): 139 % MPHR obtained:  85  Reached 85% MPHR (min:sec):  4:50 Total Exercise Time (min-sec):  4:57  Workload in METS:  6.90 Borg Scale: Not recorded    Reason ETT Terminated:  dyspnea, fatigue    ST Segment Analysis At Rest: non-specific ST segment slurring With Exercise: no evidence of significant ST depression  Other Information Arrhythmia:  No Angina during ETT:  absent (0) Quality of ETT:  diagnostic  ETT Interpretation:  normal - no evidence of ischemia by ST analysis  Comments: The patient had an moderate exercise tolerance.  There was no chest pain.  There was an appropriate level of dyspnea.  There were no arrhythmias, a normal heart rate response and normal BP response.  There were no ischemic ST T wave changes and a normal heart rate recovery.  Duke Treadmill Score was 0   Recommendations: No evidence of ischemia.  Further evaluation per Dr. Katrinka BlazingSmith.

## 2013-08-26 ENCOUNTER — Telehealth: Payer: Self-pay

## 2013-08-26 ENCOUNTER — Ambulatory Visit: Payer: Medicare Other | Admitting: Internal Medicine

## 2013-08-26 NOTE — Telephone Encounter (Signed)
pt aware of gxt results.gxt normal. pt verbalized understanding.

## 2013-08-26 NOTE — Telephone Encounter (Signed)
Message copied by Jarvis Newcomer on Tue Aug 26, 2013  8:59 AM ------      Message from: Verdis Prime      Created: Fri Aug 22, 2013 10:18 PM       normal ------

## 2013-09-03 ENCOUNTER — Telehealth: Payer: Self-pay | Admitting: *Deleted

## 2013-09-03 ENCOUNTER — Encounter: Payer: Self-pay | Admitting: Internal Medicine

## 2013-09-03 ENCOUNTER — Ambulatory Visit (INDEPENDENT_AMBULATORY_CARE_PROVIDER_SITE_OTHER): Payer: Medicare HMO | Admitting: Internal Medicine

## 2013-09-03 VITALS — BP 116/77 | HR 92 | Temp 98.1°F | Wt 245.0 lb

## 2013-09-03 DIAGNOSIS — B20 Human immunodeficiency virus [HIV] disease: Secondary | ICD-10-CM

## 2013-09-03 NOTE — Assessment & Plan Note (Signed)
Doing good.  Labs today and rtc 4 months if reassuring.  Will call with results.

## 2013-09-03 NOTE — Progress Notes (Signed)
  Subjective:    Patient ID: Miranda Kelley, female    DOB: Apr 16, 1955, 58 y.o.   MRN: [id number]  HPI She comes in for followup of her HIV.  She started Stribild earlier this year and viral load went to undetectable. She had previously been reluctant to start antiretroviral therapy however she was interested in gastric bypass and was told she needs to be on medication and suppressed in order or them to do the surgery. She did undergo gastric bypass and lost 95 lbs.  No labs prior to visit.    Review of Systems  Constitutional: Negative for fatigue and unexpected weight change.  HENT: Negative for sore throat and trouble swallowing.   Respiratory: Negative for cough and shortness of breath.   Cardiovascular: Negative for chest pain.  Gastrointestinal: Negative for nausea and diarrhea.  Musculoskeletal: Positive for arthralgias. Negative for myalgias.  Skin: Negative for rash.  Neurological: Negative for dizziness and headaches.  Hematological: Negative for adenopathy.  Psychiatric/Behavioral: Negative for dysphoric mood.       Objective:   Physical Exam  Constitutional: She appears well-developed and well-nourished. No distress.  HENT:  Mouth/Throat: No oropharyngeal exudate.  Eyes: No scleral icterus.  Cardiovascular: Normal rate, regular rhythm and normal heart sounds.  Exam reveals no gallop and no friction rub.   No murmur heard. Pulmonary/Chest: Effort normal and breath sounds normal. No respiratory distress. She has no wheezes. She has no rales.  Lymphadenopathy:    She has no cervical adenopathy.          Assessment & Plan:

## 2013-09-04 LAB — T-HELPER CELL (CD4) - (RCID CLINIC ONLY)
CD4 T CELL ABS: 1450 /uL (ref 400–2700)
CD4 T CELL HELPER: 41 % (ref 33–55)

## 2013-09-05 LAB — HIV-1 RNA QUANT-NO REFLEX-BLD
HIV 1 RNA Quant: <20 copies/mL (ref <20)
HIV-1 RNA Quant, Log: <1.3 {Log} (ref <1.30)

## 2013-09-10 NOTE — Telephone Encounter (Signed)
Error

## 2013-09-12 ENCOUNTER — Ambulatory Visit
Admission: RE | Admit: 2013-09-12 | Discharge: 2013-09-12 | Disposition: A | Discharge status: Home / Self Care | Payer: Commercial Managed Care - HMO | Source: Ambulatory Visit | Attending: Family Medicine | Admitting: Family Medicine

## 2013-09-12 ENCOUNTER — Other Ambulatory Visit: Payer: Self-pay | Admitting: Family Medicine

## 2013-09-12 DIAGNOSIS — R52 Pain, unspecified: Secondary | ICD-10-CM

## 2013-09-12 DIAGNOSIS — R609 Edema, unspecified: Secondary | ICD-10-CM

## 2013-09-16 ENCOUNTER — Other Ambulatory Visit: Payer: Self-pay | Admitting: Internal Medicine

## 2013-09-25 ENCOUNTER — Encounter: Payer: Self-pay | Admitting: *Deleted

## 2013-12-29 ENCOUNTER — Other Ambulatory Visit: Payer: Medicare HMO

## 2014-01-15 ENCOUNTER — Ambulatory Visit: Payer: Medicare HMO | Admitting: Internal Medicine

## 2014-04-11 ENCOUNTER — Encounter (HOSPITAL_COMMUNITY): Payer: Self-pay | Admitting: Emergency Medicine

## 2014-04-11 DIAGNOSIS — Z79899 Other long term (current) drug therapy: Secondary | ICD-10-CM | POA: Insufficient documentation

## 2014-04-11 DIAGNOSIS — M25551 Pain in right hip: Secondary | ICD-10-CM | POA: Diagnosis not present

## 2014-04-11 DIAGNOSIS — Z8739 Personal history of other diseases of the musculoskeletal system and connective tissue: Secondary | ICD-10-CM | POA: Insufficient documentation

## 2014-04-11 DIAGNOSIS — I10 Essential (primary) hypertension: Secondary | ICD-10-CM | POA: Diagnosis not present

## 2014-04-11 DIAGNOSIS — E079 Disorder of thyroid, unspecified: Secondary | ICD-10-CM | POA: Diagnosis not present

## 2014-04-11 MED ORDER — OXYCODONE-ACETAMINOPHEN 5-325 MG PO TABS
ORAL_TABLET | ORAL | Status: AC
  Filled 2014-04-11: qty 1

## 2014-04-11 MED ORDER — OXYCODONE-ACETAMINOPHEN 5-325 MG PO TABS
1.0000 | ORAL_TABLET | Freq: Once | ORAL | Status: AC
  Administered 2014-04-11: 1 via ORAL

## 2014-04-11 NOTE — ED Notes (Signed)
Right hip pain for 3 weeks-- worse last night -- has had hip replacement 3 yrs ago-- has been taking 6 BC's a day intermittent with tylenol 15 tabs a day for 3 weeks. Hx of gastric sleeve surgery.

## 2014-04-12 ENCOUNTER — Emergency Department (HOSPITAL_COMMUNITY)
Admission: EM | Admit: 2014-04-12 | Discharge: 2014-04-12 | Disposition: A | Discharge status: Home / Self Care | Payer: Medicare HMO | Attending: Emergency Medicine | Admitting: Emergency Medicine

## 2014-04-12 ENCOUNTER — Emergency Department (HOSPITAL_COMMUNITY): Payer: Medicare HMO

## 2014-04-12 DIAGNOSIS — M25551 Pain in right hip: Secondary | ICD-10-CM

## 2014-04-12 LAB — CBC WITH DIFFERENTIAL/PLATELET
BASOS PCT: 0 % (ref 0–1)
Basophils Absolute: 0 10*3/uL (ref 0.0–0.1)
EOS ABS: 0.3 10*3/uL (ref 0.0–0.7)
Eosinophils Relative: 4 % (ref 0–5)
HCT: 35 % — ABNORMAL LOW (ref 36.0–46.0)
Hemoglobin: 11 g/dL — ABNORMAL LOW (ref 12.0–15.0)
Lymphocytes Relative: 48 % — ABNORMAL HIGH (ref 12–46)
Lymphs Abs: 3.7 10*3/uL (ref 0.7–4.0)
MCH: 28.3 pg (ref 26.0–34.0)
MCHC: 31.4 g/dL (ref 30.0–36.0)
MCV: 90 fL (ref 78.0–100.0)
MONO ABS: 0.7 10*3/uL (ref 0.1–1.0)
Monocytes Relative: 9 % (ref 3–12)
NEUTROS PCT: 39 % — AB (ref 43–77)
Neutro Abs: 2.9 10*3/uL (ref 1.7–7.7)
Platelets: 293 10*3/uL (ref 150–400)
RBC: 3.89 MIL/uL (ref 3.87–5.11)
RDW: 14.4 % (ref 11.5–15.5)
WBC: 7.6 10*3/uL (ref 4.0–10.5)

## 2014-04-12 LAB — ACETAMINOPHEN LEVEL: Acetaminophen (Tylenol), Serum: <10 ug/mL — ABNORMAL LOW (ref 10–30)

## 2014-04-12 LAB — COMPREHENSIVE METABOLIC PANEL
ALK PHOS: 120 U/L — AB (ref 39–117)
ALT: 12 U/L (ref 0–35)
AST: 18 U/L (ref 0–37)
Albumin: 3.5 g/dL (ref 3.5–5.2)
Anion gap: 13 (ref 5–15)
BILIRUBIN TOTAL: 0.4 mg/dL (ref 0.3–1.2)
BUN: 23 mg/dL (ref 6–23)
CALCIUM: 9.6 mg/dL (ref 8.4–10.5)
CHLORIDE: 104 mmol/L (ref 96–112)
CO2: 20 mmol/L (ref 19–32)
Creatinine, Ser: 0.87 mg/dL (ref 0.50–1.10)
GFR calc non Af Amer: 72 mL/min — ABNORMAL LOW (ref >90)
GFR, EST AFRICAN AMERICAN: 83 mL/min — AB (ref >90)
GLUCOSE: 104 mg/dL — AB (ref 70–99)
Potassium: 3.9 mmol/L (ref 3.5–5.1)
Sodium: 137 mmol/L (ref 135–145)
TOTAL PROTEIN: 7.1 g/dL (ref 6.0–8.3)

## 2014-04-12 MED ORDER — OXYCODONE-ACETAMINOPHEN 5-325 MG PO TABS: 1.0000 | ORAL_TABLET | ORAL | Status: DC | PRN

## 2014-04-12 MED ORDER — IBUPROFEN 600 MG PO TABS: 600.0000 mg | ORAL_TABLET | Freq: Four times a day (QID) | ORAL | Status: DC | PRN

## 2014-04-12 MED ORDER — KETOROLAC TROMETHAMINE 60 MG/2ML IM SOLN
60.0000 mg | Freq: Once | INTRAMUSCULAR | Status: AC
  Administered 2014-04-12: 60 mg via INTRAMUSCULAR
  Filled 2014-04-12: qty 2

## 2014-04-12 NOTE — ED Provider Notes (Signed)
CSN: [id number]     Arrival date & time 04/11/14  2233 History   First MD Initiated Contact with Patient 04/12/14 0158     Chief Complaint  Patient presents with  . Hip Pain    Patient is a 59 y.o. female presenting with hip pain. The history is provided by the patient. No language interpreter was used.  Hip Pain Pertinent negatives include no abdominal pain, no headaches and no shortness of breath.    This chart was scribed for Miranda Racer, MD by Andrew Au, ED Scribe. This patient was seen in room B14C/B14C and the patient's care was started at 4:09 AM.  HPI Comments:  Miranda Kelley is a 59 y.o. female who present to the Emergency Department complaining of right hip pain for the past month. Patient states she fell while playing basketball 1 month ago landing on her left hip. She's had persistent right hip pain since Pt has hx of hip replacement 3 years ago. She reports worsening pain when sitting but alleviation when lying on her right side. She admits to taking BC powders and excessive use of Tylenol for pain control.   Past Medical History  Diagnosis Date  . Hypertension   . Thyroid disease   . Chronic sinus infection    Past Surgical History  Procedure Laterality Date  . Joint replacement  06/2009    right  . Cholecystectomy  06/05/2011    Procedure: LAPAROSCOPIC CHOLECYSTECTOMY WITH INTRAOPERATIVE CHOLANGIOGRAM;  Surgeon: Mariella Saa, MD;  Location: [facility];  Service: General;  Laterality: N/A;  . Laparoscopic gastric sleeve resection    . Total hip arthroplasty     Family History  Problem Relation Age of Onset  . Heart failure Mother   . Heart disease Mother    History  Substance Use Topics  . Smoking status: Never Smoker   . Smokeless tobacco: Never Used  . Alcohol Use: Yes     Comment: occas   OB History    No data available     Review of Systems  Constitutional: Negative for fever and chills.  Respiratory: Negative for shortness of breath.    Gastrointestinal: Negative for nausea, vomiting and abdominal pain.  Musculoskeletal: Positive for arthralgias. Negative for neck pain and neck stiffness.  Skin: Negative for pallor and rash.  Neurological: Negative for dizziness, weakness, light-headedness, numbness and headaches.  All other systems reviewed and are negative.     Allergies  Review of patient's allergies indicates no known allergies.  Home Medications   Prior to Admission medications   Medication Sig Start Date End Date Taking? Authorizing Provider  levothyroxine (SYNTHROID, LEVOTHROID) 150 MCG tablet Take 150 mcg by mouth daily.     Yes Historical Provider, MD  ibuprofen (ADVIL,MOTRIN) 600 MG tablet Take 1 tablet (600 mg total) by mouth every 6 (six) hours as needed. 04/12/14   Miranda Racer, MD  ipratropium (ATROVENT) 0.06 % nasal spray Place 2 sprays into the nose 4 (four) times daily. Patient not taking: Reported on 04/12/2014 12/22/11   Linna Hoff, MD  oxyCODONE-acetaminophen (PERCOCET) 5-325 MG per tablet Take 1 tablet by mouth every 4 (four) hours as needed for severe pain. 04/12/14   Miranda Racer, MD  STRIBILD 150-150-200-300 MG TABS tablet TAKE 1 TABLET BY MOUTH DAILY WITH BREAKFAST. Patient not taking: Reported on 04/12/2014 05/21/13   Gardiner Barefoot, MD  STRIBILD 150-150-200-300 MG TABS tablet TAKE 1 TABLET BY MOUTH DAILY WITH BREAKFAST. Patient not taking: Reported on 04/12/2014  09/16/13   Gardiner Barefootobert W Comer, MD   BP 117/59 mmHg  Pulse 62  Temp(Src) 98.3 F (36.8 C) (Oral)  Resp 16  SpO2 100%  LMP 04/03/2011 Physical Exam  Constitutional: She is oriented to person, place, and time. She appears well-developed and well-nourished. No distress.  HENT:  Head: Normocephalic and atraumatic.  Mouth/Throat: Oropharynx is clear and moist.  Eyes: EOM are normal. Pupils are equal, round, and reactive to light.  Neck: Normal range of motion. Neck supple.  Cardiovascular: Normal rate and regular rhythm.    Pulmonary/Chest: Effort normal and breath sounds normal. No respiratory distress. She has no wheezes. She has no rales.  Abdominal: Soft. Bowel sounds are normal.  Musculoskeletal: Normal range of motion. She exhibits tenderness (tenderness to palpation over the right lateral hip. No obvious deformity. Full range of motion of the right hip. Distal pulses intact.). She exhibits no edema.  Neurological: She is alert and oriented to person, place, and time.  Skin: Skin is warm and dry. No rash noted. No erythema.  Psychiatric: She has a normal mood and affect. Her behavior is normal.  Nursing note and vitals reviewed.   ED Course  Procedures (including critical care time) DIAGNOSTIC STUDIES: Oxygen Saturation is 100% on RA, normal by my interpretation.    COORDINATION OF CARE: 4:09 AM- Pt advised of plan for treatment and pt agrees.  Labs Review Labs Reviewed  CBC WITH DIFFERENTIAL/PLATELET - Abnormal; Notable for the following:    Hemoglobin 11.0 (*)    HCT 35.0 (*)    Neutrophils Relative % 39 (*)    Lymphocytes Relative 48 (*)    All other components within normal limits  COMPREHENSIVE METABOLIC PANEL - Abnormal; Notable for the following:    Glucose, Bld 104 (*)    Alkaline Phosphatase 120 (*)    GFR calc non Af Amer 72 (*)    GFR calc Af Amer 83 (*)    All other components within normal limits  ACETAMINOPHEN LEVEL - Abnormal; Notable for the following:    Acetaminophen (Tylenol), Serum <10.0 (*)    All other components within normal limits    Imaging Review Dg Hip Unilat With Pelvis 2-3 Views Right  04/12/2014   CLINICAL DATA:  History of right hip replacement. Right hip pain since a fall about a month ago. Getting more bothersome recently.  EXAM: RIGHT HIP (WITH PELVIS) 2-3 VIEWS  COMPARISON:  None.  FINDINGS: Right total hip arthroplasty using non cemented components. Screw fixation of the acetabular component. Minimal lucency at the bone hardware interface of the femoral  component could indicate evidence of loosening. Comparison studies are not available at the time of dictation to see if this represents acute or chronic process. No evidence of acute fracture or dislocation. Soft tissues are unremarkable.  IMPRESSION: Right total hip arthroplasty using non cemented components. Minimal lucency at the bone-cement interface of the femoral component could indicate evidence of loosening. No acute fracture demonstrated.  This exam was interpreted during a PACS downtime with limited availability of comparison cases. It has been flagged for review following the downtime. If clinically indicated after this review, an addendum will be issued providing details about comparison to prior imaging.   Electronically Signed   By: Burman NievesWilliam  Stevens M.D.   On: 04/12/2014 03:32     EKG Interpretation None      MDM   Final diagnoses:  Right hip pain   Hardware loosening of right hip. Likely responsible for patient's pain.  With short course of pain medication and advised follow-up with her orthopedist. Return precautions given.     Miranda Racer, MD 04/12/14 [phone]

## 2014-04-12 NOTE — ED Notes (Signed)
Pt left three cards in room; pt called at 432 and cards given to security

## 2014-04-12 NOTE — Discharge Instructions (Signed)
Call and make an appointment to follow-up with your orthopedist regarding hardware loosening in your right hip. Take pain medication only as prescribed.

## 2014-04-12 NOTE — ED Notes (Signed)
Pt right hip tender to palpation.

## 2014-05-29 ENCOUNTER — Other Ambulatory Visit: Payer: Self-pay | Admitting: Internal Medicine

## 2014-07-27 ENCOUNTER — Other Ambulatory Visit: Payer: Self-pay | Admitting: Licensed Clinical Social Worker

## 2014-07-27 MED ORDER — ELVITEG-COBIC-EMTRICIT-TENOFDF 150-150-200-300 MG PO TABS: ORAL_TABLET | ORAL | Status: DC

## 2014-07-29 ENCOUNTER — Other Ambulatory Visit: Payer: Medicare HMO

## 2014-07-29 DIAGNOSIS — B20 Human immunodeficiency virus [HIV] disease: Secondary | ICD-10-CM

## 2014-07-30 LAB — CBC WITH DIFFERENTIAL/PLATELET
BASOS ABS: 0 10*3/uL (ref 0.0–0.1)
BASOS PCT: 0 % (ref 0–1)
EOS ABS: 0.3 10*3/uL (ref 0.0–0.7)
EOS PCT: 4 % (ref 0–5)
HEMATOCRIT: 33.1 % — AB (ref 36.0–46.0)
Hemoglobin: 10.5 g/dL — ABNORMAL LOW (ref 12.0–15.0)
LYMPHS PCT: 50 % — AB (ref 12–46)
Lymphs Abs: 3.3 10*3/uL (ref 0.7–4.0)
MCH: 27.8 pg (ref 26.0–34.0)
MCHC: 31.7 g/dL (ref 30.0–36.0)
MCV: 87.6 fL (ref 78.0–100.0)
MPV: 9.6 fL (ref 8.6–12.4)
Monocytes Absolute: 0.5 10*3/uL (ref 0.1–1.0)
Monocytes Relative: 8 % (ref 3–12)
NEUTROS ABS: 2.5 10*3/uL (ref 1.7–7.7)
Neutrophils Relative %: 38 % — ABNORMAL LOW (ref 43–77)
PLATELETS: 347 10*3/uL (ref 150–400)
RBC: 3.78 MIL/uL — ABNORMAL LOW (ref 3.87–5.11)
RDW: 14.8 % (ref 11.5–15.5)
WBC: 6.5 10*3/uL (ref 4.0–10.5)

## 2014-07-30 LAB — COMPLETE METABOLIC PANEL WITH GFR
ALT: 11 U/L (ref 6–29)
AST: 14 U/L (ref 10–35)
Albumin: 3.9 g/dL (ref 3.6–5.1)
Alkaline Phosphatase: 119 U/L (ref 33–130)
BUN: 18 mg/dL (ref 7–25)
CO2: 25 meq/L (ref 20–31)
CREATININE: 0.8 mg/dL (ref 0.50–1.05)
Calcium: 9.5 mg/dL (ref 8.6–10.4)
Chloride: 106 mEq/L (ref 98–110)
GFR, Est African American: >89 mL/min (ref >=60)
GFR, Est Non African American: 82 mL/min (ref >=60)
GLUCOSE: 116 mg/dL — AB (ref 65–99)
POTASSIUM: 4.1 meq/L (ref 3.5–5.3)
Sodium: 138 mEq/L (ref 135–146)
Total Bilirubin: 0.3 mg/dL (ref 0.2–1.2)
Total Protein: 6.8 g/dL (ref 6.1–8.1)

## 2014-07-30 LAB — T-HELPER CELL (CD4) - (RCID CLINIC ONLY)
CD4 % Helper T Cell: 43 % (ref 33–55)
CD4 T CELL ABS: 1210 /uL (ref 400–2700)

## 2014-07-31 LAB — HIV-1 RNA QUANT-NO REFLEX-BLD: HIV-1 RNA Quant, Log: <1.3 {Log} (ref <1.30)

## 2014-08-04 ENCOUNTER — Ambulatory Visit: Payer: Medicare HMO | Admitting: Internal Medicine

## 2014-10-19 ENCOUNTER — Telehealth: Payer: Self-pay | Admitting: *Deleted

## 2014-10-19 NOTE — Telephone Encounter (Signed)
Requesting letter for court that she is HIV+ and dx date.  Pt has not been at [facility] since 09/2013.  Next scheduled appt is with Dr. Luciana Axeomer for 1/ 2017.  Offered pt appt for 10/21/14 at 4:15 PM with Dr. Drue SecondSnider.  Pt scheduled for that time.  Pt states that she is taking Stribild but that she may have missed some doses.

## 2014-10-21 ENCOUNTER — Ambulatory Visit: Payer: Medicare HMO | Admitting: Internal Medicine

## 2014-11-03 ENCOUNTER — Other Ambulatory Visit: Payer: Self-pay | Admitting: Internal Medicine

## 2014-11-03 DIAGNOSIS — B20 Human immunodeficiency virus [HIV] disease: Secondary | ICD-10-CM

## 2014-12-03 ENCOUNTER — Other Ambulatory Visit: Payer: Self-pay | Admitting: Internal Medicine

## 2015-01-05 ENCOUNTER — Ambulatory Visit (INDEPENDENT_AMBULATORY_CARE_PROVIDER_SITE_OTHER): Payer: Medicare HMO | Admitting: Internal Medicine

## 2015-01-05 ENCOUNTER — Other Ambulatory Visit (HOSPITAL_COMMUNITY)
Admission: RE | Admit: 2015-01-05 | Discharge: 2015-01-05 | Disposition: A | Discharge status: Home / Self Care | Payer: Medicare HMO | Source: Ambulatory Visit | Attending: Internal Medicine | Admitting: Internal Medicine

## 2015-01-05 VITALS — BP 123/82 | HR 68 | Temp 97.6°F | Ht 65.0 in | Wt 249.8 lb

## 2015-01-05 DIAGNOSIS — E038 Other specified hypothyroidism: Secondary | ICD-10-CM | POA: Diagnosis not present

## 2015-01-05 DIAGNOSIS — Z113 Encounter for screening for infections with a predominantly sexual mode of transmission: Secondary | ICD-10-CM

## 2015-01-05 DIAGNOSIS — B20 Human immunodeficiency virus [HIV] disease: Secondary | ICD-10-CM | POA: Diagnosis not present

## 2015-01-05 DIAGNOSIS — Z79899 Other long term (current) drug therapy: Secondary | ICD-10-CM | POA: Diagnosis not present

## 2015-01-05 LAB — COMPLETE METABOLIC PANEL WITH GFR
ALT: 11 U/L (ref 6–29)
AST: 16 U/L (ref 10–35)
Albumin: 4 g/dL (ref 3.6–5.1)
Alkaline Phosphatase: 121 U/L (ref 33–130)
BUN: 13 mg/dL (ref 7–25)
CALCIUM: 9.6 mg/dL (ref 8.6–10.4)
CHLORIDE: 104 mmol/L (ref 98–110)
CO2: 27 mmol/L (ref 20–31)
Creat: 0.99 mg/dL (ref 0.50–1.05)
GFR, EST NON AFRICAN AMERICAN: 63 mL/min (ref >=60)
GFR, Est African American: 72 mL/min (ref >=60)
Glucose, Bld: 102 mg/dL — ABNORMAL HIGH (ref 65–99)
POTASSIUM: 3.9 mmol/L (ref 3.5–5.3)
Sodium: 139 mmol/L (ref 135–146)
Total Bilirubin: 0.3 mg/dL (ref 0.2–1.2)
Total Protein: 7.1 g/dL (ref 6.1–8.1)

## 2015-01-05 LAB — LIPID PANEL
CHOL/HDL RATIO: 2.5 ratio (ref <=5.0)
CHOLESTEROL: 152 mg/dL (ref 125–200)
HDL: 61 mg/dL (ref >=46)
LDL CALC: 70 mg/dL (ref <130)
TRIGLYCERIDES: 106 mg/dL (ref <150)
VLDL: 21 mg/dL (ref <30)

## 2015-01-05 LAB — CBC WITH DIFFERENTIAL/PLATELET
BASOS ABS: 0.1 10*3/uL (ref 0.0–0.1)
Basophils Relative: 1 % (ref 0–1)
Eosinophils Absolute: 0.3 10*3/uL (ref 0.0–0.7)
Eosinophils Relative: 5 % (ref 0–5)
HEMATOCRIT: 35 % — AB (ref 36.0–46.0)
HEMOGLOBIN: 11.4 g/dL — AB (ref 12.0–15.0)
LYMPHS PCT: 47 % — AB (ref 12–46)
Lymphs Abs: 3 10*3/uL (ref 0.7–4.0)
MCH: 28.1 pg (ref 26.0–34.0)
MCHC: 32.6 g/dL (ref 30.0–36.0)
MCV: 86.4 fL (ref 78.0–100.0)
MPV: 9.6 fL (ref 8.6–12.4)
Monocytes Absolute: 0.6 10*3/uL (ref 0.1–1.0)
Monocytes Relative: 9 % (ref 3–12)
NEUTROS ABS: 2.4 10*3/uL (ref 1.7–7.7)
NEUTROS PCT: 38 % — AB (ref 43–77)
Platelets: 385 10*3/uL (ref 150–400)
RBC: 4.05 MIL/uL (ref 3.87–5.11)
RDW: 14.1 % (ref 11.5–15.5)
WBC: 6.3 10*3/uL (ref 4.0–10.5)

## 2015-01-05 NOTE — Progress Notes (Signed)
CC: Follow up for HIV  Interval history: Currently is asymptomatic and well-controlled on Stribild.  Since last visit no new issues, though has not been here since September 2015  Has no associated weight loss, no diarrhea.  Endorses occasional missed doses.     Also has hypothyroidism and is controlled on levothyroxine.    Prior to Admission medications   Medication Sig Start Date End Date Taking? Authorizing Provider  ibuprofen (ADVIL,MOTRIN) 600 MG tablet Take 1 tablet (600 mg total) by mouth every 6 (six) hours as needed. 04/12/14   Loren Raceravid Yelverton, MD  ipratropium (ATROVENT) 0.06 % nasal spray Place 2 sprays into the nose 4 (four) times daily. Patient not taking: Reported on 04/12/2014 12/22/11   Linna HoffJames D Kindl, MD  levothyroxine (SYNTHROID, LEVOTHROID) 150 MCG tablet Take 150 mcg by mouth daily.      Historical Provider, MD  oxyCODONE-acetaminophen (PERCOCET) 5-325 MG per tablet Take 1 tablet by mouth every 4 (four) hours as needed for severe pain. 04/12/14   Loren Raceravid Yelverton, MD  STRIBILD 150-150-200-300 MG TABS tablet TAKE 1 TABLET BY MOUTH DAILY WITH BREAKFAST. 11/03/14   Gardiner Barefootobert W Adalyne Lovick, MD    Review of Systems Constitutional: negative for anorexia Gastrointestinal: negative for nausea and diarrhea All other systems reviewed and are negative   Physical Exam: CONSTITUTIONAL:in no apparent distress and alert; vital reviewed Eyes: anicteric HENT: no thrush, no cervical lymphadenopathy Respiratory: Normal respiratory effort; CTA B  Lab Results  Component Value Date   HIV1RNAQUANT <20 07/29/2014   HIV1RNAQUANT <20 09/03/2013   HIV1RNAQUANT <20 04/23/2013   No components found for: HIV1GENOTYPRPLUS No components found for: THELPERCELL

## 2015-01-05 NOTE — Assessment & Plan Note (Signed)
Doing good despite sporadic follow up.  Labs today and rtc 6 months unless concerns.  Will see if she is interested in peer support group.

## 2015-01-05 NOTE — Assessment & Plan Note (Signed)
No symptoms of hypothyroidism.

## 2015-01-06 LAB — T-HELPER CELL (CD4) - (RCID CLINIC ONLY)
CD4 % Helper T Cell: 42 % (ref 33–55)
CD4 T Cell Abs: 1200 /uL (ref 400–2700)

## 2015-01-07 LAB — RPR

## 2015-01-07 LAB — URINE CYTOLOGY ANCILLARY ONLY
Chlamydia: NEGATIVE
Neisseria Gonorrhea: NEGATIVE

## 2015-01-08 LAB — HIV-1 RNA QUANT-NO REFLEX-BLD
HIV 1 RNA Quant: 109 copies/mL — ABNORMAL HIGH (ref <20)
HIV-1 RNA Quant, Log: 2.04 Log copies/mL — ABNORMAL HIGH (ref <1.30)

## 2015-02-02 ENCOUNTER — Other Ambulatory Visit: Payer: Self-pay | Admitting: Internal Medicine

## 2015-02-05 ENCOUNTER — Encounter: Payer: Self-pay | Admitting: Gastroenterology

## 2015-02-09 ENCOUNTER — Other Ambulatory Visit: Payer: Self-pay

## 2015-02-09 DIAGNOSIS — Z1231 Encounter for screening mammogram for malignant neoplasm of breast: Secondary | ICD-10-CM

## 2015-02-18 ENCOUNTER — Ambulatory Visit (AMBULATORY_SURGERY_CENTER): Payer: Self-pay | Admitting: *Deleted

## 2015-02-18 VITALS — Ht 65.0 in | Wt 251.0 lb

## 2015-02-18 DIAGNOSIS — Z1211 Encounter for screening for malignant neoplasm of colon: Secondary | ICD-10-CM

## 2015-02-18 NOTE — Progress Notes (Signed)
Patient denies any allergies to egg or soy products. Patient denies complications with anesthesia/sedation.  Patient denies oxygen use at home and denies diet medications. Emmi instructions for colonoscopy explained but patient denied.     

## 2015-03-04 ENCOUNTER — Ambulatory Visit (AMBULATORY_SURGERY_CENTER): Payer: Medicare HMO | Admitting: Gastroenterology

## 2015-03-04 ENCOUNTER — Encounter: Payer: Self-pay | Admitting: Gastroenterology

## 2015-03-04 VITALS — BP 138/82 | HR 60 | Temp 98.3°F | Resp 16 | Ht 65.0 in | Wt 249.0 lb

## 2015-03-04 DIAGNOSIS — Z1211 Encounter for screening for malignant neoplasm of colon: Secondary | ICD-10-CM

## 2015-03-04 MED ORDER — SODIUM CHLORIDE 0.9 % IV SOLN: 500.0000 mL | INTRAVENOUS | Status: DC

## 2015-03-04 NOTE — Op Note (Signed)
[facility] [address] [place], [address]   COLONOSCOPY PROCEDURE REPORT  PATIENT: Miranda Kelley, Miranda Kelley  MR#: [id number] BIRTHDATE: Mar 27, 1955 , 59  yrs. old GENDER: female ENDOSCOPIST: Amada Jupiter, MD REFERRED OZ:HYQMV Pecola Leisure, MD PROCEDURE DATE:  03/04/2015 PROCEDURE:   Colonoscopy, screening First Screening Colonoscopy - Avg.  risk and is 50 yrs.  old or older Yes.  Prior Negative Screening - Now for repeat screening. N/A  History of Adenoma - Now for follow-up colonoscopy & has been > or = to 3 yrs.  N/A  Recommend repeat exam, <10 yrs? No ASA CLASS:   Class II INDICATIONS:Screening for colonic neoplasia and Colorectal Neoplasm Risk Assessment for this procedure is average risk. MEDICATIONS: Monitored anesthesia care, Propofol 300 mg IV, and Lidocaine mg IV  DESCRIPTION OF PROCEDURE:   After the risks benefits and alternatives of the procedure were thoroughly explained, informed consent was obtained.  The digital rectal exam revealed decreased sphincter tone.   The [id number]  endoscope was introduced through the anus and advanced to the cecum, which was identified by both the appendix and ileocecal valve. No adverse events experienced.   The quality of the prep was excellent.  (Suprep was used) (MiraLax was used)  The instrument was then slowly withdrawn as the colon was fully examined. Estimated blood loss is zero unless otherwise noted in this procedure report.      COLON FINDINGS: A normal appearing cecum, ileocecal valve, and appendiceal orifice were identified.  The ascending, transverse, descending, sigmoid colon, and rectum appeared unremarkable. Retroflexed views revealed no abnormalities. The time to cecum = 2.0 Withdrawal time = 7.0   The scope was withdrawn and the procedure completed. COMPLICATIONS: There were no immediate complications.  ENDOSCOPIC IMPRESSION: Normal colonoscopy  RECOMMENDATIONS: Recall for surveillance  colonoscopy in 10 years  eSigned:  Amada Jupiter, MD 03/04/2015 8:16 AM   cc:

## 2015-03-04 NOTE — Progress Notes (Signed)
Stable to RR 

## 2015-03-04 NOTE — Patient Instructions (Signed)
YOU HAD AN ENDOSCOPIC PROCEDURE TODAY AT THE [facility]:   Refer to the procedure report that was given to you for any specific questions about what was found during the examination.  If the procedure report does not answer your questions, please call your gastroenterologist to clarify.  If you requested that your care partner not be given the details of your procedure findings, then the procedure report has been included in a sealed envelope for you to review at your convenience later.  YOU SHOULD EXPECT: Some feelings of bloating in the abdomen. Passage of more gas than usual.  Walking can help get rid of the air that was put into your GI tract during the procedure and reduce the bloating. If you had a lower endoscopy (such as a colonoscopy or flexible sigmoidoscopy) you may notice spotting of blood in your stool or on the toilet paper. If you underwent a bowel prep for your procedure, you may not have a normal bowel movement for a few days.  Please Note:  You might notice some irritation and congestion in your nose or some drainage.  This is from the oxygen used during your procedure.  There is no need for concern and it should clear up in a day or so.  SYMPTOMS TO REPORT IMMEDIATELY:   Following lower endoscopy (colonoscopy or flexible sigmoidoscopy):  Excessive amounts of blood in the stool  Significant tenderness or worsening of abdominal pains  Swelling of the abdomen that is new, acute  Fever of 100F or higher   For urgent or emergent issues, a gastroenterologist can be reached at any hour by calling [phone].   DIET: Your first meal following the procedure should be a small meal and then it is ok to progress to your normal diet. Heavy or fried foods are harder to digest and may make you feel nauseous or bloated.  Likewise, meals heavy in dairy and vegetables can increase bloating.  Drink plenty of fluids but you should avoid alcoholic beverages for 24  hours.  ACTIVITY:  You should plan to take it easy for the rest of today and you should NOT DRIVE or use heavy machinery until tomorrow (because of the sedation medicines used during the test).    FOLLOW UP: Our staff will call the number listed on your records the next business day following your procedure to check on you and address any questions or concerns that you may have regarding the information given to you following your procedure. If we do not reach you, we will leave a message.  However, if you are feeling well and you are not experiencing any problems, there is no need to return our call.  We will assume that you have returned to your regular daily activities without incident.  If any biopsies were taken you will be contacted by phone or by letter within the next 1-3 weeks.  Please call us at [phone] if you have not heard about the biopsies in 3 weeks.    SIGNATURES/CONFIDENTIALITY: You and/or your care partner have signed paperwork which will be entered into your electronic medical record.  These signatures attest to the fact that that the information above on your After Visit Summary has been reviewed and is understood.  Full responsibility of the confidentiality of this discharge information lies with you and/or your care-partner.  Thank-you for choosing us for your healthcare needs today. 

## 2015-03-05 ENCOUNTER — Telehealth: Payer: Self-pay

## 2015-03-05 NOTE — Telephone Encounter (Signed)
  Follow up Call-  Call back number 03/04/2015  Post procedure Call Back phone  # [phone]  Permission to leave phone message Yes     Patient was called for follow up after procedure on 03/04/2015. No answer at the number given for follow up phone call. A message was left on the answering machine.

## 2015-03-19 ENCOUNTER — Ambulatory Visit: Payer: Medicare HMO

## 2015-05-31 ENCOUNTER — Ambulatory Visit (HOSPITAL_COMMUNITY)
Admission: EM | Admit: 2015-05-31 | Discharge: 2015-05-31 | Disposition: A | Discharge status: Home / Self Care | Payer: Medicare HMO | Attending: Family Medicine | Admitting: Family Medicine

## 2015-05-31 ENCOUNTER — Encounter (HOSPITAL_COMMUNITY): Payer: Self-pay | Admitting: *Deleted

## 2015-05-31 ENCOUNTER — Ambulatory Visit (INDEPENDENT_AMBULATORY_CARE_PROVIDER_SITE_OTHER): Payer: Medicare HMO

## 2015-05-31 DIAGNOSIS — S6392XA Sprain of unspecified part of left wrist and hand, initial encounter: Secondary | ICD-10-CM | POA: Diagnosis not present

## 2015-05-31 DIAGNOSIS — S63619A Unspecified sprain of unspecified finger, initial encounter: Secondary | ICD-10-CM

## 2015-05-31 NOTE — Discharge Instructions (Signed)
Finger Sprain A finger sprain is a tear in one of the strong, fibrous tissues that connect the bones (ligaments) in your finger. The severity of the sprain depends on how much of the ligament is torn. The tear can be either partial or complete. CAUSES  Often, sprains are a result of a fall or accident. If you extend your hands to catch an object or to protect yourself, the force of the impact causes the fibers of your ligament to stretch too much. This excess tension causes the fibers of your ligament to tear. SYMPTOMS  You may have some loss of motion in your finger. Other symptoms include:  Bruising.  Tenderness.  Swelling. DIAGNOSIS  In order to diagnose finger sprain, your caregiver will physically examine your finger or thumb to determine how torn the ligament is. Your caregiver may also suggest an X-ray exam of your finger to make sure no bones are broken. TREATMENT  If your ligament is only partially torn, treatment usually involves keeping the finger in a fixed position (immobilization) for a short period. To do this, your caregiver will apply a bandage, cast, or splint to keep your finger from moving until it heals. For a partially torn ligament, the healing process usually takes 2 to 3 weeks. If your ligament is completely torn, you may need surgery to reconnect the ligament to the bone. After surgery a cast or splint will be applied and will need to stay on your finger or thumb for 4 to 6 weeks while your ligament heals. HOME CARE INSTRUCTIONS  Keep your injured finger elevated, when possible, to decrease swelling.  To ease pain and swelling, apply ice to your joint twice a day, for 2 to 3 days:  Put ice in a plastic bag.  Place a towel between your skin and the bag.  Leave the ice on for 15 minutes.  Only take over-the-counter or prescription medicine for pain as directed by your caregiver.  Do not wear rings on your injured finger.  Do not leave your finger unprotected  until pain and stiffness go away (usually 3 to 4 weeks).  Do not allow your cast or splint to get wet. Cover your cast or splint with a plastic bag when you shower or bathe. Do not swim.  Your caregiver may suggest special exercises for you to do during your recovery to prevent or limit permanent stiffness. SEEK IMMEDIATE MEDICAL CARE IF:  Your cast or splint becomes damaged.  Your pain becomes worse rather than better. MAKE SURE YOU:  Understand these instructions.  Will watch your condition.  Will get help right away if you are not doing well or get worse.   This information is not intended to replace advice given to you by your health care provider. Make sure you discuss any questions you have with your health care provider.   Document Released: 01/27/2004 Document Revised: 01/09/2014 Document Reviewed: 08/22/2010 Elsevier Interactive Patient Education 2016 [employer]  

## 2015-05-31 NOTE — ED Notes (Signed)
Reports stumbling into washing machine yesterday, catching herself with left hand.  C/O pain and swelling to left hand.  CMS intact.  Has been taking Tyl without relief.

## 2015-05-31 NOTE — ED Provider Notes (Signed)
CSN: [id number]     Arrival date & time 05/31/15  1316 History   First MD Initiated Contact with Patient 05/31/15 1426     Chief Complaint  Patient presents with  . Hand Injury   (Consider location/radiation/quality/duration/timing/severity/associated sxs/prior Treatment) HPI Comments: As per nursing notes this 60 year old female tripped walking into a trailer and that she fell forward she caught herself from falling with her left hand on a washing machine. This resulted in hyperextension of the fourth and fifth digits. She is complaining of pain along with swelling to the hand and proximal phalanges of the fourth and fifth digit. Denies pain, swelling or decrease in range of motion of the wrist or forearm. Denies other injuries.  Patient is a 60 y.o. female presenting with hand injury.  Hand Injury Associated symptoms: no back pain and no fever     Past Medical History  Diagnosis Date  . Thyroid disease   . Chronic sinus infection   . Anxiety   . Arthritis     knees  . Cataract     surgery - bilateral  . HIV infection (HCC)   . Hypertension     hx - no longer a problem - weight loss surgery -lost 100lbs   Past Surgical History  Procedure Laterality Date  . Joint replacement  06/2009    right knee  . Cholecystectomy  06/05/2011    Procedure: LAPAROSCOPIC CHOLECYSTECTOMY WITH INTRAOPERATIVE CHOLANGIOGRAM;  Surgeon: Mariella SaaBenjamin T Hoxworth, MD;  Location: [facility];  Service: General;  Laterality: N/A;  . Laparoscopic gastric sleeve resection  2015  . Total hip arthroplasty    . Cataract surgery Bilateral    Family History  Problem Relation Age of Onset  . Heart failure Mother   . Heart disease Mother   . Colon cancer Neg Hx   . Colon polyps Neg Hx   . Esophageal cancer Neg Hx   . Rectal cancer Neg Hx   . Stomach cancer Neg Hx   . Breast cancer Maternal Grandmother    Social History  Substance Use Topics  . Smoking status: Never Smoker   . Smokeless tobacco: Never Used  .  Alcohol Use: Yes     Comment: occasionally   OB History    No data available     Review of Systems  Constitutional: Negative for fever, chills and activity change.  HENT: Negative.   Respiratory: Negative.   Musculoskeletal: Negative for myalgias and back pain.       As per HPI  Skin: Negative for color change, pallor and rash.  Neurological: Negative.   All other systems reviewed and are negative.   Allergies  Review of patient's allergies indicates no known allergies.  Home Medications   Prior to Admission medications   Medication Sig Start Date End Date Taking? Authorizing Provider  ALPRAZolam Prudy Feeler(XANAX) 1 MG tablet Take 1 mg by mouth as needed for anxiety.   Yes Historical Provider, MD  bisacodyl (DULCOLAX) 5 MG EC tablet Take 5 mg by mouth daily as needed for moderate constipation. Dulcolax 5 mg tab take as directed for colonoscopy prep.   Yes Historical Provider, MD  Cholecalciferol (VITAMIN D3) 2000 units TABS Take by mouth.   Yes Historical Provider, MD  clobetasol ointment (TEMOVATE) 0.05 % Reported on 03/04/2015 02/22/12  Yes Historical Provider, MD  ibuprofen (ADVIL,MOTRIN) 600 MG tablet Take 1 tablet (600 mg total) by mouth every 6 (six) hours as needed. 04/12/14  Yes Loren Raceravid Yelverton, MD  ipratropium (ATROVENT)  0.06 % nasal spray Place 2 sprays into the nose 4 (four) times daily. 12/22/11  Yes Linna Hoff, MD  levothyroxine (SYNTHROID, LEVOTHROID) 150 MCG tablet Take 150 mcg by mouth daily.     Yes Historical Provider, MD  oxyCODONE-acetaminophen (PERCOCET) 5-325 MG per tablet Take 1 tablet by mouth every 4 (four) hours as needed for severe pain. 04/12/14  Yes [name], MD  STRIBILD 150-150-200-300 MG TABS tablet TAKE 1 TABLET BY MOUTH DAILY WITH BREAKFAST. 02/03/15  Yes Gardiner Barefoot, MD   Meds Ordered and Administered this Visit  Medications - No data to display  BP 121/68 mmHg  Pulse 63  Temp(Src) 97.9 F (36.6 C) (Oral)  Resp 16  SpO2 100%  LMP  04/03/2011 No data found.   Physical Exam  Constitutional: She is oriented to person, place, and time. She appears well-developed and well-nourished. No distress.  HENT:  Head: Normocephalic and atraumatic.  Eyes: EOM are normal.  Neck: Normal range of motion. Neck supple.  Cardiovascular: Normal rate.   Pulmonary/Chest: Effort normal.  Musculoskeletal: She exhibits edema and tenderness.  Swelling and tenderness to the dorsum of of the hand over the fourth and fifth metacarpals, MCPs, as well as the proximal phalanges.  Flexion is intact but limited with the fourth and fifth digits. Extension is complete. Distal neurovascular and motor sensory intact.  Neurological: She is alert and oriented to person, place, and time. No cranial nerve deficit.  Skin: Skin is warm and dry.  Psychiatric: She has a normal mood and affect.  Nursing note and vitals reviewed.   ED Course  Procedures (including critical care time)  Labs Review Labs Reviewed - No data to display  Imaging Review Dg Hand Complete Left  05/31/2015  CLINICAL DATA:  60 year old female with history of trauma from a fall yesterday with injury to the left hand. Pain and swelling. EXAM: LEFT HAND - COMPLETE 3+ VIEW COMPARISON:  No priors. FINDINGS: Multiple views of the left hand demonstrate no acute displaced fracture, subluxation, dislocation, or soft tissue abnormality. IMPRESSION: No acute radiographic abnormality of the left hand. Electronically Signed   By: Trudie Reed M.D.   On: 05/31/2015 14:54     Visual Acuity Review  Right Eye Distance:   Left Eye Distance:   Bilateral Distance:    Right Eye Near:   Left Eye Near:    Bilateral Near:         MDM   1. Finger sprain, initial encounter   2. Hand sprain, left, initial encounter    Ice, elevation and wear splint for a few days for protection and immobilization. May remove as necessary  Provider applied the thumb spica splint to the ulnar hand and  digits. Fits well and provides comfort and support.    Hayden Rasmussen, NP 05/31/15 [phone]

## 2015-07-01 ENCOUNTER — Encounter: Payer: Self-pay | Admitting: Internal Medicine

## 2015-07-01 ENCOUNTER — Ambulatory Visit (INDEPENDENT_AMBULATORY_CARE_PROVIDER_SITE_OTHER): Payer: Medicare HMO | Admitting: Internal Medicine

## 2015-07-01 VITALS — BP 131/88 | HR 64 | Temp 98.2°F | Wt 254.0 lb

## 2015-07-01 DIAGNOSIS — B20 Human immunodeficiency virus [HIV] disease: Secondary | ICD-10-CM

## 2015-07-01 DIAGNOSIS — I10 Essential (primary) hypertension: Secondary | ICD-10-CM

## 2015-07-01 NOTE — Assessment & Plan Note (Signed)
Stable bp 

## 2015-07-01 NOTE — Assessment & Plan Note (Signed)
Doing well with a likely blip last visit.  Will do today and rtc 6 months unless concerns.

## 2015-07-01 NOTE — Progress Notes (Signed)
CC: Follow up for HIV  Interval history: Currently is asymptomatic and well-controlled on Stribild.  Did have a mild elevation of her viral load last visit.  Since last visit no new issues.  Has no associated weight loss, no diarrhea.  Endorses occasional missed doses.     Also has hypothyroidism and is controlled on levothyroxine.    Prior to Admission medications   Medication Sig Start Date End Date Taking? Authorizing Provider  ibuprofen (ADVIL,MOTRIN) 600 MG tablet Take 1 tablet (600 mg total) by mouth every 6 (six) hours as needed. 04/12/14   Loren Raceravid Yelverton, MD  ipratropium (ATROVENT) 0.06 % nasal spray Place 2 sprays into the nose 4 (four) times daily. Patient not taking: Reported on 04/12/2014 12/22/11   Linna HoffJames D Kindl, MD  levothyroxine (SYNTHROID, LEVOTHROID) 150 MCG tablet Take 150 mcg by mouth daily.      Historical Provider, MD  oxyCODONE-acetaminophen (PERCOCET) 5-325 MG per tablet Take 1 tablet by mouth every 4 (four) hours as needed for severe pain. 04/12/14   Loren Raceravid Yelverton, MD  STRIBILD 150-150-200-300 MG TABS tablet TAKE 1 TABLET BY MOUTH DAILY WITH BREAKFAST. 11/03/14   Gardiner Barefootobert W Andrzej Scully, MD    Review of Systems Constitutional: negative for anorexia Gastrointestinal: negative for nausea and diarrhea All other systems reviewed and are negative   Physical Exam: CONSTITUTIONAL:in no apparent distress and alert; vital reviewed Eyes: anicteric HENT: no thrush, no cervical lymphadenopathy Respiratory: Normal respiratory effort; CTA B  Lab Results  Component Value Date   HIV1RNAQUANT 109* 01/05/2015   HIV1RNAQUANT <20 07/29/2014   HIV1RNAQUANT <20 09/03/2013   No components found for: HIV1GENOTYPRPLUS No components found for: THELPERCELL

## 2015-07-02 LAB — T-HELPER CELL (CD4) - (RCID CLINIC ONLY)
CD4 % Helper T Cell: 40 % (ref 33–55)
CD4 T Cell Abs: 1300 /uL (ref 400–2700)

## 2015-07-05 LAB — HIV-1 RNA QUANT-NO REFLEX-BLD: HIV 1 RNA Quant: <20 copies/mL (ref <20)

## 2015-07-13 ENCOUNTER — Other Ambulatory Visit: Payer: Self-pay | Admitting: Internal Medicine

## 2015-12-08 ENCOUNTER — Other Ambulatory Visit: Payer: Self-pay | Admitting: Internal Medicine

## 2015-12-08 DIAGNOSIS — B20 Human immunodeficiency virus [HIV] disease: Secondary | ICD-10-CM

## 2015-12-21 ENCOUNTER — Ambulatory Visit: Payer: Medicare HMO | Admitting: Internal Medicine

## 2016-01-06 ENCOUNTER — Encounter: Payer: Self-pay | Admitting: Internal Medicine

## 2016-01-06 ENCOUNTER — Ambulatory Visit (INDEPENDENT_AMBULATORY_CARE_PROVIDER_SITE_OTHER): Payer: Medicare Other | Admitting: Internal Medicine

## 2016-01-06 VITALS — BP 147/89 | HR 73 | Temp 97.4°F | Ht 65.0 in | Wt 261.0 lb

## 2016-01-06 DIAGNOSIS — B20 Human immunodeficiency virus [HIV] disease: Secondary | ICD-10-CM

## 2016-01-06 DIAGNOSIS — Z79899 Other long term (current) drug therapy: Secondary | ICD-10-CM | POA: Diagnosis not present

## 2016-01-06 DIAGNOSIS — M818 Other osteoporosis without current pathological fracture: Secondary | ICD-10-CM | POA: Diagnosis not present

## 2016-01-06 DIAGNOSIS — Z5181 Encounter for therapeutic drug level monitoring: Secondary | ICD-10-CM

## 2016-01-06 DIAGNOSIS — Z113 Encounter for screening for infections with a predominantly sexual mode of transmission: Secondary | ICD-10-CM

## 2016-01-06 LAB — CBC WITH DIFFERENTIAL/PLATELET
BASOS ABS: 0 {cells}/uL (ref 0–200)
Basophils Relative: 0 %
Eosinophils Absolute: 114 cells/uL (ref 15–500)
Eosinophils Relative: 2 %
HCT: 36.5 % (ref 35.0–45.0)
Hemoglobin: 11.3 g/dL — ABNORMAL LOW (ref 11.7–15.5)
LYMPHS PCT: 52 %
Lymphs Abs: 2964 cells/uL (ref 850–3900)
MCH: 27.4 pg (ref 27.0–33.0)
MCHC: 31 g/dL — AB (ref 32.0–36.0)
MCV: 88.4 fL (ref 80.0–100.0)
MONOS PCT: 7 %
MPV: 9.7 fL (ref 7.5–12.5)
Monocytes Absolute: 399 cells/uL (ref 200–950)
NEUTROS PCT: 39 %
Neutro Abs: 2223 cells/uL (ref 1500–7800)
PLATELETS: 325 10*3/uL (ref 140–400)
RBC: 4.13 MIL/uL (ref 3.80–5.10)
RDW: 14.7 % (ref 11.0–15.0)
WBC: 5.7 10*3/uL (ref 3.8–10.8)

## 2016-01-06 LAB — COMPLETE METABOLIC PANEL WITH GFR
ALT: 14 U/L (ref 6–29)
AST: 17 U/L (ref 10–35)
Albumin: 4 g/dL (ref 3.6–5.1)
Alkaline Phosphatase: 87 U/L (ref 33–130)
BUN: 17 mg/dL (ref 7–25)
CO2: 25 mmol/L (ref 20–31)
Calcium: 9.7 mg/dL (ref 8.6–10.4)
Chloride: 104 mmol/L (ref 98–110)
Creat: 0.97 mg/dL (ref 0.50–0.99)
GFR, Est African American: 73 mL/min (ref >=60)
GFR, Est Non African American: 64 mL/min (ref >=60)
Glucose, Bld: 81 mg/dL (ref 65–99)
Potassium: 4.4 mmol/L (ref 3.5–5.3)
Sodium: 139 mmol/L (ref 135–146)
Total Bilirubin: 0.5 mg/dL (ref 0.2–1.2)
Total Protein: 7.2 g/dL (ref 6.1–8.1)

## 2016-01-06 LAB — LIPID PANEL
CHOLESTEROL: 195 mg/dL (ref <200)
HDL: 74 mg/dL (ref >50)
LDL Cholesterol: 100 mg/dL — ABNORMAL HIGH (ref <100)
TRIGLYCERIDES: 106 mg/dL (ref <150)
Total CHOL/HDL Ratio: 2.6 Ratio (ref <5.0)
VLDL: 21 mg/dL (ref <30)

## 2016-01-06 MED ORDER — ELVITEG-COBIC-EMTRICIT-TENOFAF 150-150-200-10 MG PO TABS: 1.0000 | ORAL_TABLET | Freq: Every day | ORAL | 11 refills | Status: DC

## 2016-01-06 NOTE — Progress Notes (Signed)
CC: Follow up for HIV  Interval history: Currently is asymptomatic and well-controlled on Stribild.  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  Has no associated n/v.  Denies any missed doses.   Continued knee pain.  No other new issues. Anxiety controllled.    Prior to Admission medications   Medication Sig Start Date End Date Taking? Authorizing Provider  ALPRAZolam Prudy Feeler(XANAX) 1 MG tablet Take 1 mg by mouth as needed for anxiety.   Yes Historical Provider, MD  bisacodyl (DULCOLAX) 5 MG EC tablet Take 5 mg by mouth daily as needed for moderate constipation. Dulcolax 5 mg tab take as directed for colonoscopy prep.   Yes Historical Provider, MD  Cholecalciferol (VITAMIN D3) 2000 units TABS Take by mouth.   Yes Historical Provider, MD  clobetasol ointment (TEMOVATE) 0.05 % Reported on 03/04/2015 02/22/12  Yes Historical Provider, MD  ibuprofen (ADVIL,MOTRIN) 600 MG tablet Take 1 tablet (600 mg total) by mouth every 6 (six) hours as needed. 04/12/14  Yes Loren Raceravid Yelverton, MD  ipratropium (ATROVENT) 0.06 % nasal spray Place 2 sprays into the nose 4 (four) times daily. 12/22/11  Yes Linna HoffJames D Kindl, MD  levothyroxine (SYNTHROID, LEVOTHROID) 150 MCG tablet Take 150 mcg by mouth daily.     Yes Historical Provider, MD  oxyCODONE-acetaminophen (PERCOCET) 5-325 MG per tablet Take 1 tablet by mouth every 4 (four) hours as needed for severe pain. 04/12/14  Yes Loren Raceravid Yelverton, MD  elvitegravir-cobicistat-emtricitabine-tenofovir (GENVOYA) 150-150-200-10 MG TABS tablet Take 1 tablet by mouth daily. 01/06/16   Gardiner Barefootobert W Ceejay Kegley, MD    Review of Systems Constitutional: negative for fatigue and malaise Gastrointestinal: negative for diarrhea Integument/breast: negative for rash Musculoskeletal: negative for myalgias and arthralgias All other systems reviewed and are negative    Physical Exam: CONSTITUTIONAL:in no apparent distress and alert  Vitals:   01/06/16 1425  BP:  (!) 147/89  Pulse: 73  Temp: 97.4 F (36.3 C)   Eyes: anicteric HENT: no thrush, no cervical lymphadenopathy Respiratory: Normal respiratory effort; CTA B Cardiovascular: RRR  Lab Results  Component Value Date   HIV1RNAQUANT <20 07/01/2015   HIV1RNAQUANT 109 (H) 01/05/2015   HIV1RNAQUANT <20 07/29/2014   No components found for: HIV1GENOTYPRPLUS No components found for: THELPERCELL  SH: no alcohol

## 2016-01-07 LAB — T-HELPER CELL (CD4) - (RCID CLINIC ONLY)
CD4 T CELL ABS: 1250 /uL (ref 400–2700)
CD4 T CELL HELPER: 41 % (ref 33–55)

## 2016-01-07 LAB — RPR

## 2016-01-10 DIAGNOSIS — M810 Age-related osteoporosis without current pathological fracture: Secondary | ICD-10-CM | POA: Insufficient documentation

## 2016-01-10 DIAGNOSIS — Z5181 Encounter for therapeutic drug level monitoring: Secondary | ICD-10-CM | POA: Insufficient documentation

## 2016-01-10 LAB — HIV-1 RNA QUANT-NO REFLEX-BLD: HIV 1 RNA Quant: <20 copies/mL (ref <20)

## 2016-01-10 NOTE — Assessment & Plan Note (Addendum)
Doing well with no issues. [facility].  rtc 6 months.

## 2016-01-10 NOTE — Assessment & Plan Note (Signed)
I will change her to TAF-containing regimen to decrease effects on bone density and hopefully have some improvement.

## 2016-01-10 NOTE — Assessment & Plan Note (Signed)
Doing well, creat stable and changing to TAF regimen.

## 2016-04-07 ENCOUNTER — Other Ambulatory Visit: Payer: Self-pay | Admitting: Family Medicine

## 2016-04-07 DIAGNOSIS — Z1231 Encounter for screening mammogram for malignant neoplasm of breast: Secondary | ICD-10-CM

## 2016-04-14 ENCOUNTER — Ambulatory Visit
Admission: RE | Admit: 2016-04-14 | Discharge: 2016-04-14 | Disposition: A | Discharge status: Home / Self Care | Payer: Medicare Other | Source: Ambulatory Visit | Attending: Family Medicine | Admitting: Family Medicine

## 2016-04-14 DIAGNOSIS — Z1231 Encounter for screening mammogram for malignant neoplasm of breast: Secondary | ICD-10-CM

## 2016-04-23 NOTE — Progress Notes (Signed)
Cardiology Office Note    Date:  04/24/2016   ID:  Miranda Kelley, DOB [birth year], MRN [id number]  PCP:  Karie Chimera, MD  Cardiologist: Lesleigh Noe, MD   Chief Complaint  Patient presents with  . Shortness of Breath    History of Present Illness:  Miranda Kelley is a 61 y.o. female for evaluation of obesity, dyspnea, family h/o CAD, and hypertension  She is doing well. She denies dyspnea. She has intermittent left shoulder left subclavicular and left anterior arm discomfort. It is precipitated by heavy lifting. Once present it is made worse by palpation. He can last hours. She has had episodes of this off and on for greater than 10 years. She was having a similar discomfort 3 years ago when a stress test was done. No significant abnormalities were noted on that exam. She denies orthopnea, and PND.   Past Medical History:  Diagnosis Date  . Anxiety   . Arthritis    knees  . Cataract    surgery - bilateral  . Chronic sinus infection   . HIV infection (HCC)   . Hypertension    hx - no longer a problem - weight loss surgery -lost 100lbs  . Thyroid disease     Past Surgical History:  Procedure Laterality Date  . cataract surgery Bilateral   . CHOLECYSTECTOMY  06/05/2011   Procedure: LAPAROSCOPIC CHOLECYSTECTOMY WITH INTRAOPERATIVE CHOLANGIOGRAM;  Surgeon: Mariella Saa, MD;  Location: [facility];  Service: General;  Laterality: N/A;  . JOINT REPLACEMENT  06/2009   right knee  . LAPAROSCOPIC GASTRIC SLEEVE RESECTION  2015  . TOTAL HIP ARTHROPLASTY      Current Medications: Outpatient Medications Prior to Visit  Medication Sig Dispense Refill  . ALPRAZolam (XANAX) 1 MG tablet Take 1 mg by mouth as needed for anxiety.    . bisacodyl (DULCOLAX) 5 MG EC tablet Take 5 mg by mouth daily as needed for moderate constipation. Dulcolax 5 mg tab take as directed for colonoscopy prep.    . Cholecalciferol (VITAMIN D3) 2000 units TABS Take 2,000 Units by mouth daily.     .  clobetasol ointment (TEMOVATE) 0.05 % Reported on 03/04/2015    . elvitegravir-cobicistat-emtricitabine-tenofovir (GENVOYA) 150-150-200-10 MG TABS tablet Take 1 tablet by mouth daily. 30 tablet 11  . ibuprofen (ADVIL,MOTRIN) 600 MG tablet Take 1 tablet (600 mg total) by mouth every 6 (six) hours as needed. 30 tablet 0  . ipratropium (ATROVENT) 0.06 % nasal spray Place 2 sprays into the nose 4 (four) times daily. 15 mL 1  . levothyroxine (SYNTHROID, LEVOTHROID) 150 MCG tablet Take 150 mcg by mouth daily.      [name] oxyCODONE-acetaminophen (PERCOCET) 5-325 MG per tablet Take 1 tablet by mouth every 4 (four) hours as needed for severe pain. 10 tablet 0   No facility-administered medications prior to visit.      Allergies:   Patient has no known allergies.   Social History   Social History  . Marital status: Divorced    Spouse name: N/A  . Number of children: N/A  . Years of education: N/A   Social History Main Topics  . Smoking status: Never Smoker  . Smokeless tobacco: Never Used  . Alcohol use Yes     Comment: occasionally  . Drug use: No  . Sexual activity: Not on file     Comment: pt. given condoms   Other Topics Concern  . Not on file   Social History Narrative  .  No narrative on file     Family History:  The patient's family history includes Breast cancer in her maternal grandmother; Heart disease in her mother; Heart failure in her mother.   ROS:   Please see the history of present illness.    None other than weight gain  All other systems reviewed and are negative.   PHYSICAL EXAM:   VS:  BP 126/84 (BP Location: Left Arm)   Pulse 69   Ht  (1.676 m)   Wt 261 lb 12.8 oz (118.8 kg)   LMP 04/03/2011   BMI 42.26 kg/m    GEN: Well nourished, well developed, in no acute distress . Marked obesity. HEENT: normal  Neck: no JVD, carotid bruits, or masses Cardiac: RRR; no murmurs, rubs, or gallops,no edema  Respiratory:  clear to auscultation bilaterally, normal work  of breathing GI: soft, nontender, nondistended, + BS MS: no deformity or atrophy  Skin: warm and dry, no rash Neuro:  Alert and Oriented x 3, Strength and sensation are intact Psych: euthymic mood, full affect  Wt Readings from Last 3 Encounters:  04/24/16 261 lb 12.8 oz (118.8 kg)  01/06/16 261 lb (118.4 kg)  07/01/15 254 lb (115.2 kg)      Studies/Labs Reviewed:   EKG:  EKG  Sinus rhythm with nonspecific T-wave flattening. Incomplete right bundle.  Recent Labs: 01/06/2016: ALT 14; BUN 17; Creat 0.97; Hemoglobin 11.3; Platelets 325; Potassium 4.4; Sodium 139   Lipid Panel    Component Value Date/Time   CHOL 195 01/06/2016 1451   TRIG 106 01/06/2016 1451   HDL 74 01/06/2016 1451   CHOLHDL 2.6 01/06/2016 1451   VLDL 21 01/06/2016 1451   LDLCALC 100 (H) 01/06/2016 1451    Additional studies/ records that were reviewed today include:  Echocardiogram 2009: SUMMARY - Overall left ventricular systolic function was normal. Left    ventricular ejection fraction was estimated , range being 55    % to 60 %. There were no left ventricular regional wall    motion abnormalities. Left ventricular wall thickness was    mildly increased. There was mild focal basal septal    hypertrophy.  Exercise treadmill test 08/12/13:  ETT Interpretation: normal - no evidence of ischemia by ST  analysis  Comments: The patient had an moderate exercise tolerance. There was no  chest pain. There was an appropriate level of dyspnea. There  were no arrhythmias, a normal heart rate response and normal BP  response. There were no ischemic ST T wave changes and a normal  heart rate recovery. [facility] Treadmill Score was 0  ASSESSMENT:    1. Left sided chest pain   2. Essential hypertension   3. PVC (premature ventricular contraction)   4. Human immunodeficiency virus (HIV) disease (HCC)      PLAN:  In order of problems listed above:  1. Atypical features consistent  with musculoskeletal discomfort. I do not believe a repeat stress test is required. EKG and exam unremarkable. Plan clinical observation. 2. Well controlled blood pressure currently less than 130/90 mmHg. 3. Not a recent problem 4. Not addressed  As needed if symptoms.  Medication Adjustments/Labs and Tests Ordered: Current medicines are reviewed at length with the patient today.  Concerns regarding medicines are outlined above.  Medication changes, Labs and Tests ordered today are listed in the Patient Instructions below. Patient Instructions  Medication Instructions:  None  Labwork: None  Testing/Procedures: None   Follow-Up: Your physician recommends that you schedule  a follow-up appointment as needed with Dr. Katrinka Blazing.   Any Other Special Instructions Will Be Listed Below (If Applicable).     If you need a refill on your cardiac medications before your next appointment, please call your pharmacy.      Signed, Lesleigh Noe, MD  04/24/2016 1:53 PM    [facility] [address] [place], [place], [place]  [address] Phone: [phone]; Fax: [phone]

## 2016-04-24 ENCOUNTER — Ambulatory Visit (INDEPENDENT_AMBULATORY_CARE_PROVIDER_SITE_OTHER): Payer: Medicare Other | Admitting: Interventional Cardiology

## 2016-04-24 ENCOUNTER — Encounter (INDEPENDENT_AMBULATORY_CARE_PROVIDER_SITE_OTHER): Payer: Self-pay

## 2016-04-24 VITALS — BP 126/84 | HR 69 | Ht 66.0 in | Wt 261.8 lb

## 2016-04-24 DIAGNOSIS — R079 Chest pain, unspecified: Secondary | ICD-10-CM | POA: Diagnosis not present

## 2016-04-24 DIAGNOSIS — I10 Essential (primary) hypertension: Secondary | ICD-10-CM | POA: Diagnosis not present

## 2016-04-24 DIAGNOSIS — I493 Ventricular premature depolarization: Secondary | ICD-10-CM

## 2016-04-24 DIAGNOSIS — B20 Human immunodeficiency virus [HIV] disease: Secondary | ICD-10-CM

## 2016-04-24 NOTE — Patient Instructions (Addendum)
Medication Instructions:  None  Labwork: None  Testing/Procedures: None  Follow-Up: Your physician recommends that you schedule a follow-up appointment as needed with Dr. Smith.    Any Other Special Instructions Will Be Listed Below (If Applicable).     If you need a refill on your cardiac medications before your next appointment, please call your pharmacy.   

## 2016-06-14 ENCOUNTER — Ambulatory Visit (HOSPITAL_COMMUNITY)
Admission: EM | Admit: 2016-06-14 | Discharge: 2016-06-14 | Disposition: A | Discharge status: Home / Self Care | Payer: Medicare Other | Attending: Family Medicine | Admitting: Family Medicine

## 2016-06-14 ENCOUNTER — Encounter (HOSPITAL_COMMUNITY): Payer: Self-pay | Admitting: Emergency Medicine

## 2016-06-14 DIAGNOSIS — M545 Low back pain, unspecified: Secondary | ICD-10-CM

## 2016-06-14 DIAGNOSIS — G8929 Other chronic pain: Secondary | ICD-10-CM | POA: Diagnosis not present

## 2016-06-14 MED ORDER — PREDNISONE 10 MG (21) PO TBPK: ORAL_TABLET | Freq: Every day | ORAL | 0 refills | Status: DC

## 2016-06-14 NOTE — ED Provider Notes (Signed)
CSN: [id number]     Arrival date & time 06/14/16  1107 History   None    Chief Complaint  Patient presents with  . Hip Pain   (Consider location/radiation/quality/duration/timing/severity/associated sxs/prior Treatment) 61 year old female presents to clinic for evaluation of left sided lower back and hip pain ongoing for last 3 days. She does have a chronic history of pain. She is seen by primary care, and pain management, she has taken Percocet for her pain, and reports some relief. Has no fever, chills, constitutional symptoms, she has not fallen, or has history of any other type of injury. Also, she has not lifted anything heavy, denies twisting her back either.   The history is provided by the patient.  Hip Pain     Past Medical History:  Diagnosis Date  . Anxiety   . Arthritis    knees  . Cataract    surgery - bilateral  . Chronic sinus infection   . HIV infection (HCC)   . Hypertension    hx - no longer a problem - weight loss surgery -lost 100lbs  . Thyroid disease    Past Surgical History:  Procedure Laterality Date  . cataract surgery Bilateral   . CHOLECYSTECTOMY  06/05/2011   Procedure: LAPAROSCOPIC CHOLECYSTECTOMY WITH INTRAOPERATIVE CHOLANGIOGRAM;  Surgeon: Mariella Saa, MD;  Location: [facility];  Service: General;  Laterality: N/A;  . JOINT REPLACEMENT  06/2009   right knee  . LAPAROSCOPIC GASTRIC SLEEVE RESECTION  2015  . TOTAL HIP ARTHROPLASTY     Family History  Problem Relation Age of Onset  . Heart failure Mother   . Heart disease Mother   . Breast cancer Maternal Grandmother   . Colon cancer Neg Hx   . Colon polyps Neg Hx   . Esophageal cancer Neg Hx   . Rectal cancer Neg Hx   . Stomach cancer Neg Hx    Social History  Substance Use Topics  . Smoking status: Never Smoker  . Smokeless tobacco: Never Used  . Alcohol use Yes     Comment: occasionally   OB History    No data available     Review of Systems  Constitutional: Negative.    HENT: Negative.   Respiratory: Negative.   Cardiovascular: Negative.   Musculoskeletal: Positive for back pain.  Skin: Negative.   Neurological: Negative.     Allergies  Patient has no known allergies.  Home Medications   Prior to Admission medications   Medication Sig Start Date End Date Taking? Authorizing Provider  ALPRAZolam Prudy Feeler) 1 MG tablet Take 1 mg by mouth as needed for anxiety.   Yes [provider]  bisacodyl (DULCOLAX) 5 MG EC tablet Take 5 mg by mouth daily as needed for moderate constipation. Dulcolax 5 mg tab take as directed for colonoscopy prep.   Yes [provider]  Cholecalciferol (VITAMIN D3) 2000 units TABS Take 2,000 Units by mouth daily.    Yes [provider]  elvitegravir-cobicistat-emtricitabine-tenofovir (GENVOYA) 150-150-200-10 MG TABS tablet Take 1 tablet by mouth daily. 01/06/16  Yes Comer, Belia Heman, MD  levothyroxine (SYNTHROID, LEVOTHROID) 150 MCG tablet Take 150 mcg by mouth daily.     Yes [provider]  oxyCODONE-acetaminophen (PERCOCET) 5-325 MG per tablet Take 1 tablet by mouth every 4 (four) hours as needed for severe pain. 04/12/14  Yes [name], MD  clobetasol ointment (TEMOVATE) 0.05 % Reported on 03/04/2015 02/22/12   [provider]  ibuprofen (ADVIL,MOTRIN) 600 MG tablet Take 1  tablet (600 mg total) by mouth every 6 (six) hours as needed. 04/12/14   Loren RacerYelverton, David, MD  ipratropium (ATROVENT) 0.06 % nasal spray Place 2 sprays into the nose 4 (four) times daily. 12/22/11   Linna HoffKindl, James D, MD  predniSONE (STERAPRED UNI-PAK 21 TAB) 10 MG (21) TBPK tablet Take by mouth daily. Take 6 tabs by mouth daily  for 2 days, then 5 tabs for 2 days, then 4 tabs for 2 days, then 3 tabs for 2 days, 2 tabs for 2 days, then 1 tab by mouth daily for 2 days 06/14/16   Dorena BodoKennard, Araceli Arango, NP   Meds Ordered and Administered this Visit  Medications - No data to display  BP 130/80 (BP Location: Right Wrist)   Pulse 79    Temp 99 F (37.2 C) (Oral)   LMP 04/03/2011   SpO2 100%  No data found.   Physical Exam  Constitutional: She is oriented to person, place, and time. She appears well-developed and well-nourished. No distress.  HENT:  Head: Normocephalic.  Right Ear: External ear normal.  Left Ear: External ear normal.  Eyes: Conjunctivae are normal.  Neck: Normal range of motion.  Cardiovascular: Normal rate and regular rhythm.   Pulmonary/Chest: Effort normal and breath sounds normal.  Musculoskeletal:       Lumbar back: She exhibits tenderness and pain. She exhibits no bony tenderness, no swelling and no deformity.  Neurological: She is alert and oriented to person, place, and time.  Skin: Skin is warm and dry. Capillary refill takes less than 2 seconds. She is not diaphoretic.  Psychiatric: She has a normal mood and affect. Her behavior is normal.  Nursing note and vitals reviewed.   Urgent Care Course     Procedures (including critical care time)  Labs Review Labs Reviewed - No data to display  Imaging Review No results found.    MDM   1. Chronic right-sided low back pain without sciatica     Continue the Percocets as prescribed by her primary care provider, started on prednisone as well. Follow-up with primary care for reevaluation and to 2 weeks.    Dorena BodoKennard, Amirra Herling, NP 06/14/16 1313

## 2016-06-14 NOTE — Discharge Instructions (Signed)
Continue the Percocets prescribed by your regular provider, I have also prescribed some steroids as well, follow up with your primary care provider in one week as needed for further evaluation and management of your pain.

## 2016-06-14 NOTE — ED Triage Notes (Signed)
Pt reports left hip pain that radiates to her lower back that started yesterday morning.  She denies any injury or fall.  Pt states she took a Percocet last night that helped.

## 2016-06-16 ENCOUNTER — Telehealth: Payer: Self-pay | Admitting: *Deleted

## 2016-06-16 NOTE — Telephone Encounter (Signed)
Patient called stating she is in [place] and forgot her medication, Genvoya at home. She wanted to know if she "would be alright". Asked if she has missed doses in the past and she said no. Told her we would know for sure once she does her labs and she is going to come in on 06/22/16 for lab work. She has a follow up MD appt on 07/03/16. Wendall MolaJacqueline Cockerham

## 2016-06-22 ENCOUNTER — Other Ambulatory Visit: Payer: Medicare Other

## 2016-06-28 ENCOUNTER — Ambulatory Visit: Payer: Medicaid Other | Admitting: Internal Medicine

## 2016-07-03 ENCOUNTER — Ambulatory Visit: Payer: Medicaid Other | Admitting: Internal Medicine

## 2016-10-23 ENCOUNTER — Ambulatory Visit (INDEPENDENT_AMBULATORY_CARE_PROVIDER_SITE_OTHER): Payer: Medicare Other | Admitting: Podiatry

## 2016-10-23 ENCOUNTER — Ambulatory Visit (INDEPENDENT_AMBULATORY_CARE_PROVIDER_SITE_OTHER): Payer: Medicare Other

## 2016-10-23 VITALS — BP 126/86 | HR 71 | Ht 65.0 in | Wt 252.0 lb

## 2016-10-23 DIAGNOSIS — M76822 Posterior tibial tendinitis, left leg: Secondary | ICD-10-CM | POA: Diagnosis not present

## 2016-10-23 DIAGNOSIS — M25572 Pain in left ankle and joints of left foot: Secondary | ICD-10-CM

## 2016-10-23 DIAGNOSIS — M19072 Primary osteoarthritis, left ankle and foot: Secondary | ICD-10-CM

## 2016-10-23 DIAGNOSIS — M79676 Pain in unspecified toe(s): Secondary | ICD-10-CM

## 2016-10-25 NOTE — Progress Notes (Signed)
    HPI: 61 year old female presents today as a new patient with a complaint of left ankle pain that has been ongoing for the past 5 years. She states the ankle rolls and gives out frequently. She states she takes about 4 BC Powders daily so she can walk. She is here for further evaluation and treatment.    Past Medical History:  Diagnosis Date  . Anxiety   . Arthritis    knees  . Cataract    surgery - bilateral  . Chronic sinus infection   . HIV infection (HCC)   . Hypertension    hx - no longer a problem - weight loss surgery -lost 100lbs  . Thyroid disease        Physical Exam: General: The patient is alert and oriented x3 in no acute distress.  Dermatology: Skin is warm, dry and supple bilateral lower extremities. Negative for open lesions or macerations.  Vascular: Palpable pedal pulses bilaterally. No edema or erythema noted. Capillary refill within normal limits.  Neurological: Epicritic and protective threshold grossly intact bilaterally.   Musculoskeletal Exam: Pain on palpation noted to the posterior tibial tendon of the left foot. Range of motion within normal limits. Muscle strength 5/5 in all muscle groups bilateral lower extremities.  Radiographic Exam:  Degenerative changes noted to the subtalar, talonavicular and calcaneocuboid joints of the left foot. Pes planus also noted to the left foot.    Assessment: 1. Severe DJD left foot 2. PTTD left foot   Plan of Care:  1. Patient was evaluated. Radiographs were reviewed today. 2. Discussed bracing vs surgery. Today we will pursue conservative treatment.  3. Appt with Raiford Nobleick for custom brace. 4. Return to clinic in 8 weeks.   Felecia ShellingBrent M. Evans, DPM [facility]  Dr. Felecia ShellingBrent M. Evans, DPM    [address]                                        [place], [place] [address]                Office [phone]  Fax [phone]

## 2016-10-31 ENCOUNTER — Encounter: Payer: Self-pay | Admitting: Podiatry

## 2016-10-31 ENCOUNTER — Other Ambulatory Visit: Payer: Medicare Other | Admitting: Orthotics

## 2016-10-31 NOTE — Progress Notes (Signed)
CD of x-rays requested by pt put up front to be mailed out either today Tuesday 30 October or tomorrow 01 November 2016.

## 2016-11-15 ENCOUNTER — Other Ambulatory Visit: Payer: Medicare Other

## 2016-11-15 DIAGNOSIS — B20 Human immunodeficiency virus [HIV] disease: Secondary | ICD-10-CM

## 2016-11-16 ENCOUNTER — Other Ambulatory Visit: Payer: Self-pay | Admitting: Internal Medicine

## 2016-11-16 LAB — T-HELPER CELL (CD4) - (RCID CLINIC ONLY)
CD4 % Helper T Cell: 36 % (ref 33–55)
CD4 T Cell Abs: 1370 /uL (ref 400–2700)

## 2016-11-17 LAB — HIV-1 RNA QUANT-NO REFLEX-BLD
HIV 1 RNA Quant: <20 copies/mL
HIV-1 RNA Quant, Log: <1.3 Log copies/mL

## 2016-11-28 ENCOUNTER — Ambulatory Visit: Payer: Medicare Other | Admitting: Orthotics

## 2016-11-28 ENCOUNTER — Ambulatory Visit: Payer: Medicare Other | Admitting: Internal Medicine

## 2016-11-28 DIAGNOSIS — M19072 Primary osteoarthritis, left ankle and foot: Secondary | ICD-10-CM

## 2016-11-28 NOTE — Progress Notes (Signed)
Brace fit well, but patient did not have proper footwear.  Ordering a MBS shoe 11 xw for brace to fit.

## 2016-11-30 ENCOUNTER — Ambulatory Visit (INDEPENDENT_AMBULATORY_CARE_PROVIDER_SITE_OTHER): Payer: Medicare Other | Admitting: Internal Medicine

## 2016-11-30 ENCOUNTER — Encounter: Payer: Self-pay | Admitting: Internal Medicine

## 2016-11-30 VITALS — BP 126/84 | HR 61 | Temp 98.3°F | Ht 65.0 in | Wt 265.0 lb

## 2016-11-30 DIAGNOSIS — Z7185 Encounter for immunization safety counseling: Secondary | ICD-10-CM

## 2016-11-30 DIAGNOSIS — Z79899 Other long term (current) drug therapy: Secondary | ICD-10-CM

## 2016-11-30 DIAGNOSIS — B20 Human immunodeficiency virus [HIV] disease: Secondary | ICD-10-CM

## 2016-11-30 DIAGNOSIS — Z113 Encounter for screening for infections with a predominantly sexual mode of transmission: Secondary | ICD-10-CM | POA: Diagnosis not present

## 2016-11-30 DIAGNOSIS — Z7189 Other specified counseling: Secondary | ICD-10-CM | POA: Diagnosis not present

## 2016-11-30 MED ORDER — ELVITEG-COBIC-EMTRICIT-TENOFAF 150-150-200-10 MG PO TABS: 1.0000 | ORAL_TABLET | Freq: Every day | ORAL | 11 refills | Status: DC

## 2016-11-30 NOTE — Assessment & Plan Note (Signed)
Counseled on flu shot. refused

## 2016-11-30 NOTE — Assessment & Plan Note (Signed)
Doing well.  No issues so rtc 1 year.

## 2016-11-30 NOTE — Progress Notes (Signed)
   Subjective:    Patient ID: Miranda Kelley, female    DOB: 12/24/1955, 61 y.o.   MRN: [id number]  HPI Here for follow up of HIV Last seen in January 2018 and doing well.  [facility]  CD4 of 1370 and viral load remains suppressed.  No associated n/v/d.  Did miss one dose since January.     Review of Systems  Constitutional: Negative for fatigue.  Gastrointestinal: Negative for diarrhea.  Skin: Negative for rash.       Objective:   Physical Exam  Constitutional: She appears well-developed and well-nourished. No distress.  HENT:  Mouth/Throat: No oropharyngeal exudate.  Eyes: No scleral icterus.  Cardiovascular: Normal rate, regular rhythm and normal heart sounds.  No murmur heard. Pulmonary/Chest: Effort normal and breath sounds normal. No respiratory distress.  Skin: No rash noted.          Assessment & Plan:

## 2016-12-07 ENCOUNTER — Other Ambulatory Visit: Payer: Medicare Other | Admitting: Orthotics

## 2016-12-18 ENCOUNTER — Ambulatory Visit (INDEPENDENT_AMBULATORY_CARE_PROVIDER_SITE_OTHER): Payer: Medicare Other | Admitting: Podiatry

## 2016-12-18 DIAGNOSIS — M19072 Primary osteoarthritis, left ankle and foot: Secondary | ICD-10-CM | POA: Diagnosis not present

## 2016-12-18 DIAGNOSIS — M76822 Posterior tibial tendinitis, left leg: Secondary | ICD-10-CM

## 2016-12-18 MED ORDER — TRAMADOL HCL 50 MG PO TABS: 50.0000 mg | ORAL_TABLET | Freq: Three times a day (TID) | ORAL | 0 refills | Status: DC | PRN

## 2016-12-18 MED ORDER — MELOXICAM 15 MG PO TABS: 15.0000 mg | ORAL_TABLET | Freq: Every day | ORAL | 1 refills | Status: AC

## 2016-12-20 NOTE — Progress Notes (Signed)
    HPI: [id number] year old female presents today for follow up evaluation of left ankle pain.    Past Medical History:  Diagnosis Date  . Anxiety   . Arthritis    knees  . Cataract    surgery - bilateral  . Chronic sinus infection   . HIV infection (HCC)   . Hypertension    hx - no longer a problem - weight loss surgery -lost 100lbs  . Thyroid disease        Physical Exam: General: The patient is alert and oriented x3 in no acute distress.  Dermatology: Skin is warm, dry and supple bilateral lower extremities. Negative for open lesions or macerations.  Vascular: Palpable pedal pulses bilaterally. No edema or erythema noted. Capillary refill within normal limits.  Neurological: Epicritic and protective threshold grossly intact bilaterally.   Musculoskeletal Exam: Pain on palpation noted to the posterior tibial tendon of the left foot. Pain with palpation of the medial, anterior and lateral aspects of the left ankle. Range of motion within normal limits. Muscle strength 5/5 in all muscle groups bilateral lower extremities.  Assessment: 1. DJD/left ankle synovitis  2. PT tendinitis left    Plan of Care:  1. Patient was evaluated.  2. Patient gets anxiety with needles and refused injection today. 3. Follow up with Raiford Nobleick for custom bracing. 4. Prescription for Meloxicam 15 mg provided to patient. 5. Prescription for Tramadol provided to patient.  6. Return to clinic when necessary.    Felecia ShellingBrent M. Evans, DPM [facility]  Dr. Felecia ShellingBrent M. Evans, DPM    [address]                                        [place], [place] [address]                Office [phone]  Fax [phone]

## 2017-01-11 ENCOUNTER — Ambulatory Visit: Payer: Medicare Other | Admitting: Orthotics

## 2017-01-11 DIAGNOSIS — M19072 Primary osteoarthritis, left ankle and foot: Secondary | ICD-10-CM

## 2017-01-11 DIAGNOSIS — M76822 Posterior tibial tendinitis, left leg: Secondary | ICD-10-CM

## 2017-01-11 NOTE — Progress Notes (Signed)
Patient came in today to pick up brace and MBShoes.   Patient didn't like shoes, even though it was wide enough to accommodate brace; we tried another shoe and finally patient said, "I don't like the shoes or the brace, and I wont wear either."  She left.

## 2017-01-31 ENCOUNTER — Telehealth: Payer: Self-pay | Admitting: Podiatry

## 2017-01-31 MED ORDER — TRAMADOL HCL 50 MG PO TABS: 50.0000 mg | ORAL_TABLET | Freq: Three times a day (TID) | ORAL | 0 refills | Status: DC | PRN

## 2017-01-31 NOTE — Addendum Note (Signed)
Addended by: Alphia Kava'CONNELL, VALERY D on: 01/31/2017 12:12 PM   Modules accepted: Orders

## 2017-01-31 NOTE — Telephone Encounter (Signed)
Faxed tramadol to [phone].

## 2017-01-31 NOTE — Telephone Encounter (Signed)
Okay to refill? 

## 2017-01-31 NOTE — Telephone Encounter (Signed)
Pt would like Tramadol refilled

## 2017-01-31 NOTE — Telephone Encounter (Signed)
I informed pt of Dr. Logan BoresEvans orders to refill the tramadol as previously.

## 2017-02-05 ENCOUNTER — Telehealth: Payer: Self-pay | Admitting: *Deleted

## 2017-02-05 NOTE — Telephone Encounter (Signed)
Refill request Tramadol. Dr. Logan BoresEvans states pt needs an appt if continuing to have pain. Return fax denying.

## 2017-02-09 ENCOUNTER — Telehealth: Payer: Self-pay | Admitting: *Deleted

## 2017-02-09 MED ORDER — MELOXICAM 15 MG PO TABS: 15.0000 mg | ORAL_TABLET | Freq: Every day | ORAL | 0 refills | Status: DC

## 2017-02-09 NOTE — Telephone Encounter (Signed)
Refill request meloxicam. Dr. Logan BoresEvans ordered refill as previously 12/18/2016.

## 2017-04-30 ENCOUNTER — Ambulatory Visit
Admission: RE | Admit: 2017-04-30 | Discharge: 2017-04-30 | Disposition: A | Discharge status: Home / Self Care | Payer: Medicare Other | Source: Ambulatory Visit | Attending: Family Medicine | Admitting: Family Medicine

## 2017-04-30 ENCOUNTER — Other Ambulatory Visit: Payer: Self-pay | Admitting: Family Medicine

## 2017-04-30 DIAGNOSIS — R52 Pain, unspecified: Secondary | ICD-10-CM

## 2017-05-10 ENCOUNTER — Telehealth: Payer: Self-pay | Admitting: *Deleted

## 2017-05-10 MED ORDER — TRAMADOL HCL 50 MG PO TABS: ORAL_TABLET | ORAL | 0 refills | Status: DC

## 2017-05-10 NOTE — Telephone Encounter (Signed)
Received refill request for Tramadol. Dr. Logan Bores states refill as previously. Faxed completed form to [phone].

## 2017-06-24 ENCOUNTER — Other Ambulatory Visit: Payer: Self-pay

## 2017-06-24 ENCOUNTER — Ambulatory Visit (HOSPITAL_COMMUNITY)
Admission: EM | Admit: 2017-06-24 | Discharge: 2017-06-24 | Disposition: A | Discharge status: Home / Self Care | Payer: Medicare Other | Attending: Family Medicine | Admitting: Family Medicine

## 2017-06-24 ENCOUNTER — Encounter (HOSPITAL_COMMUNITY): Payer: Self-pay | Admitting: Emergency Medicine

## 2017-06-24 DIAGNOSIS — M791 Myalgia, unspecified site: Secondary | ICD-10-CM

## 2017-06-24 NOTE — ED Notes (Signed)
Patient refused EKG.

## 2017-06-24 NOTE — ED Provider Notes (Signed)
[facility]    CSN: [id number] Arrival date & time: 06/24/17  1645     History   Chief Complaint Chief Complaint  Patient presents with  . Muscle Pain    HPI Miranda Kelley is a 62 y.o. female.   62 year old female with history of anxiety, arthritis, HIV, HTN, hypothyroidism comes in for 2-day history of muscle aches.  States she started with right arm soreness, left leg pain, and some heaviness to her chest.  States she felt like she had to take deeper breaths last night.  She denies chest pain, shortness of breath, wheezing, orthopnea, leg swelling.  Denies increase in activity.  Recently traveled to [place] for vacation,  which is around a 5 hr drive back.  She denies fever, chills, night sweats.  Denies nausea, vomiting, diaphoresis.  Denies tick bite, risks of tick bite. She has had a mild cough coming home without other URI symptoms such as congestion, sore throat, rhinorrhea.  States "I just want to make sure it is not a heart attack".  She sees a cardiologist yearly due to family history of congestive heart failure.  She takes  care of her mother, and does groceries, yard work on her own without difficulty.  She denies any exertional dyspnea, anginal symptoms.  Has not needed to decrease her activity due to symptoms.  She has not taken anything for the symptoms.     Past Medical History:  Diagnosis Date  . Anxiety   . Arthritis    knees  . Cataract    surgery - bilateral  . Chronic sinus infection   . HIV infection (HCC)   . Hypertension    hx - no longer a problem - weight loss surgery -lost 100lbs  . Thyroid disease     Patient Active Problem List   Diagnosis Date Noted  . Vaccine counseling 11/30/2016  . Osteoporosis 01/10/2016  . Medication monitoring encounter 01/10/2016  . Encounter for long-term (current) use of medications 04/23/2013  . Screening examination for venereal disease 04/23/2013  . Depressive episode 09/19/2011  . Symptomatic  cholelithiasis 06/04/2011  . HTN (hypertension) 02/02/2011  . Human immunodeficiency virus (HIV) disease (HCC) 12/01/2008  . PVC (premature ventricular contraction) 11/19/2008  . ELEVATED BLOOD PRESSURE WITHOUT DIAGNOSIS OF HYPERTENSION 09/16/2008  . DENTAL CARIES 07/01/2008  . OSTEOARTHRITIS, HIP, RIGHT 08/24/2007  . KNEE PAIN, RIGHT 06/21/2007  . FATIGUE 06/21/2007  . GERD 12/18/2006  . ANEMIA 10/08/2006  . Obesity 10/05/2006  . Pain in joint, ankle and foot 10/05/2006  . 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 Hypothyroidism 09/28/2006    Past Surgical History:  Procedure Laterality Date  . cataract surgery Bilateral   . CHOLECYSTECTOMY  06/05/2011   Procedure: LAPAROSCOPIC CHOLECYSTECTOMY WITH INTRAOPERATIVE CHOLANGIOGRAM;  Surgeon: Mariella Saa, MD;  Location: [facility];  Service: General;  Laterality: N/A;  . JOINT REPLACEMENT  06/2009   right knee  . LAPAROSCOPIC GASTRIC SLEEVE RESECTION  2015  . TOTAL HIP ARTHROPLASTY      OB History   None      Home Medications    Prior to Admission medications   Medication Sig Start Date End Date Taking? Authorizing Provider  ALPRAZolam Prudy Feeler) 1 MG tablet Take 1 mg by mouth as needed for anxiety.    [provider]  bisacodyl (DULCOLAX) 5 MG EC tablet Take 5 mg by mouth daily as needed for moderate constipation. Dulcolax 5 mg tab take as directed for colonoscopy prep.    [provider]  Cholecalciferol (VITAMIN D3) 2000 units TABS Take 2,000 Units by mouth daily.     [provider]  clobetasol ointment (TEMOVATE) 0.05 % Reported on 03/04/2015 02/22/12   [provider]  elvitegravir-cobicistat-emtricitabine-tenofovir (GENVOYA) 150-150-200-10 MG TABS tablet Take 1 tablet by mouth daily. 11/30/16   Gardiner Barefootomer, Robert W, MD  ibuprofen (ADVIL,MOTRIN) 600 MG tablet Take 1 tablet (600 mg total) by mouth every 6 (six) hours as needed. 04/12/14   Loren RacerYelverton, David, MD  ipratropium (ATROVENT) 0.06 % nasal spray Place 2 sprays into  the nose 4 (four) times daily. 12/22/11   Linna HoffKindl, James D, MD  levothyroxine (SYNTHROID, LEVOTHROID) 150 MCG tablet Take 150 mcg by mouth daily.      [provider]  meloxicam (MOBIC) 15 MG tablet Take 1 tablet (15 mg total) by mouth daily. 02/09/17   Felecia ShellingEvans, Brent M, DPM  oxyCODONE-acetaminophen (PERCOCET) 5-325 MG per tablet Take 1 tablet by mouth every 4 (four) hours as needed for severe pain. 04/12/14   Loren RacerYelverton, David, MD  traMADol (ULTRAM) 50 MG tablet Take 1 tablet (50 mg total) by mouth every 8 (eight) hours as needed. 12/18/16   Felecia ShellingEvans, Brent M, DPM  traMADol (ULTRAM) 50 MG tablet Take 1 tablet (50 mg total) by mouth every 8 (eight) hours as needed. 01/31/17   Felecia ShellingEvans, Brent M, DPM  traMADol (ULTRAM) 50 MG tablet Take one tablet every 8 hours prn foot pain. 05/10/17   Felecia ShellingEvans, Brent M, DPM    Family History Family History  Problem Relation Age of Onset  . Heart failure Mother   . Heart disease Mother   . Breast cancer Maternal Grandmother   . Colon cancer Neg Hx   . Colon polyps Neg Hx   . Esophageal cancer Neg Hx   . Rectal cancer Neg Hx   . Stomach cancer Neg Hx     Social History Social History   Tobacco Use  . Smoking status: Never Smoker  . Smokeless tobacco: Never Used  Substance Use Topics  . Alcohol use: Yes    Comment: occasionally  . Drug use: No     Allergies   Patient has no known allergies.   Review of Systems Review of Systems  Reason unable to perform ROS: See HPI as above.     Physical Exam Triage Vital Signs ED Triage Vitals  Enc Vitals Group     BP 06/24/17 1654 116/72     Pulse Rate 06/24/17 1654 71     Resp 06/24/17 1654 18     Temp 06/24/17 1654 98.1 F (36.7 C)     Temp Source 06/24/17 1654 Oral     SpO2 06/24/17 1654 98 %     Weight --      Height --      Head Circumference --      Peak Flow --      Pain Score 06/24/17 1651 8     Pain Loc --      Pain Edu? --      Excl. in GC? --    No data found.  Updated Vital  Signs BP 116/72 (BP Location: Left Arm)   Pulse 71   Temp 98.1 F (36.7 C) (Oral)   Resp 18   LMP 04/03/2011   SpO2 98%   Physical Exam  Constitutional: She is oriented to person, place, and time. She appears well-developed and well-nourished. No distress.  HENT:  Head: Normocephalic and atraumatic.  Eyes: Pupils are equal, round, and reactive  to light. Conjunctivae are normal.  Neck: Normal range of motion. Neck supple.  Cardiovascular: Normal rate, regular rhythm and normal heart sounds. Exam reveals no gallop and no friction rub.  No murmur heard. Pulmonary/Chest: Effort normal and breath sounds normal. No stridor. No respiratory distress. She has no wheezes. She has no rales. She exhibits tenderness (sternal, left chest).  Musculoskeletal:  No leg swelling, pitting edema, erythema, increased warmth to the calf.  No tenderness to palpation of lower extremity, upper extremity.  Full range of motion of neck, shoulder, knee.  Strength normal and equal bilaterally, sensation intact and equal bilaterally.  Negative Homans.  Neurological: She is alert and oriented to person, place, and time.  Skin: Skin is warm and dry. She is not diaphoretic.   UC Treatments / Results  Labs (all labs ordered are listed, but only abnormal results are displayed) Labs Reviewed - No data to display  EKG None  Radiology No results found.  Procedures Procedures (including critical care time)  Medications Ordered in UC Medications - No data to display  Initial Impression / Assessment and Plan / UC Course  I have reviewed the triage vital signs and the nursing notes.  Pertinent labs & imaging results that were available during my care of the patient were reviewed by me and considered in my medical decision making (see chart for details).    Patient declined EKG after consideration, states "I'm not having a heart attack." She would like to monitor symptoms for now, left prior to discharge  paperwork/reevaluation. At leaving, she is stable without chest pain, shortness of breath, palpitations. She is without tachycardia, tachypnea and is normotensive. Verbal return precautions provided, including monitoring for worsening chest pain, DOE, shortness of breath, weakness, dizziness, orthopnea. Patient to follow up with her cardiologist for further evaluation needed.   Final Clinical Impressions(s) / UC Diagnoses   Final diagnoses:  Muscle pain   Discharge Instructions   None    ED Prescriptions    None        Belinda Fisher, PA-C 06/24/17 1749

## 2017-06-24 NOTE — ED Triage Notes (Signed)
[facility] a [facility] of multiple locations of generalized pain. The patient reported pain in her right arm, left leg, and some heaviness in her chest that increased with inspiration and palpation.

## 2017-07-14 ENCOUNTER — Encounter (HOSPITAL_COMMUNITY): Payer: Self-pay | Admitting: Emergency Medicine

## 2017-07-14 ENCOUNTER — Ambulatory Visit (HOSPITAL_COMMUNITY)
Admission: EM | Admit: 2017-07-14 | Discharge: 2017-07-14 | Disposition: A | Discharge status: Home / Self Care | Payer: Medicare Other | Attending: Internal Medicine | Admitting: Internal Medicine

## 2017-07-14 ENCOUNTER — Ambulatory Visit (INDEPENDENT_AMBULATORY_CARE_PROVIDER_SITE_OTHER): Payer: Medicare Other

## 2017-07-14 DIAGNOSIS — M25511 Pain in right shoulder: Secondary | ICD-10-CM | POA: Diagnosis not present

## 2017-07-14 MED ORDER — CYCLOBENZAPRINE HCL 10 MG PO TABS: 5.0000 mg | ORAL_TABLET | Freq: Two times a day (BID) | ORAL | 0 refills | Status: DC | PRN

## 2017-07-14 MED ORDER — DICLOFENAC SODIUM 75 MG PO TBEC: 75.0000 mg | DELAYED_RELEASE_TABLET | Freq: Two times a day (BID) | ORAL | 0 refills | Status: DC

## 2017-07-14 NOTE — ED Provider Notes (Signed)
[facility]    CSN: [id number] Arrival date & time: 07/14/17  1331     History   Chief Complaint Chief Complaint  Patient presents with  . Shoulder Pain    HPI Shalicia Craghead is a 62 y.o. female.   62 y.o. female presents with right shoulder pain that occurred after she was reaching inbetween the car seats to pick something up. Patient states that she heard a pop with immediate pain and restrictive motion x 1 day. Condition is acute in nature. Condition is made better by nothing. Condition is made worse by movement. Patient denies any treatment prior to there arrival at this facility.       Past Medical History:  Diagnosis Date  . Anxiety   . Arthritis    knees  . Cataract    surgery - bilateral  . Chronic sinus infection   . HIV infection (HCC)   . Hypertension    hx - no longer a problem - weight loss surgery -lost 100lbs  . Thyroid disease     Patient Active Problem List   Diagnosis Date Noted  . Vaccine counseling 11/30/2016  . Osteoporosis 01/10/2016  . Medication monitoring encounter 01/10/2016  . Encounter for long-term (current) use of medications 04/23/2013  . Screening examination for venereal disease 04/23/2013  . Depressive episode 09/19/2011  . Symptomatic cholelithiasis 06/04/2011  . HTN (hypertension) 02/02/2011  . Human immunodeficiency virus (HIV) disease (HCC) 12/01/2008  . PVC (premature ventricular contraction) 11/19/2008  . ELEVATED BLOOD PRESSURE WITHOUT DIAGNOSIS OF HYPERTENSION 09/16/2008  . DENTAL CARIES 07/01/2008  . OSTEOARTHRITIS, HIP, RIGHT 08/24/2007  . KNEE PAIN, RIGHT 06/21/2007  . FATIGUE 06/21/2007  . GERD 12/18/2006  . ANEMIA 10/08/2006  . Obesity 10/05/2006  . Pain in joint, ankle and foot 10/05/2006  . 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 Hypothyroidism 09/28/2006    Past Surgical History:  Procedure Laterality Date  . cataract surgery Bilateral   . CHOLECYSTECTOMY  06/05/2011   Procedure: LAPAROSCOPIC CHOLECYSTECTOMY WITH  INTRAOPERATIVE CHOLANGIOGRAM;  Surgeon: Mariella Saa, MD;  Location: [facility];  Service: General;  Laterality: N/A;  . JOINT REPLACEMENT  06/2009   right knee  . LAPAROSCOPIC GASTRIC SLEEVE RESECTION  2015  . TOTAL HIP ARTHROPLASTY      OB History   None      Home Medications    Prior to Admission medications   Medication Sig Start Date End Date Taking? Authorizing Provider  ALPRAZolam Prudy Feeler) 1 MG tablet Take 1 mg by mouth as needed for anxiety.    [provider]  bisacodyl (DULCOLAX) 5 MG EC tablet Take 5 mg by mouth daily as needed for moderate constipation. Dulcolax 5 mg tab take as directed for colonoscopy prep.    [provider]  Cholecalciferol (VITAMIN D3) 2000 units TABS Take 2,000 Units by mouth daily.     [provider]  clobetasol ointment (TEMOVATE) 0.05 % Reported on 03/04/2015 02/22/12   [provider]  elvitegravir-cobicistat-emtricitabine-tenofovir (GENVOYA) 150-150-200-10 MG TABS tablet Take 1 tablet by mouth daily. 11/30/16   Gardiner Barefoot, MD  ibuprofen (ADVIL,MOTRIN) 600 MG tablet Take 1 tablet (600 mg total) by mouth every 6 (six) hours as needed. 04/12/14   [name], MD  ipratropium (ATROVENT) 0.06 % nasal spray Place 2 sprays into the nose 4 (four) times daily. 12/22/11   Linna Hoff, MD  levothyroxine (SYNTHROID, LEVOTHROID) 150 MCG tablet Take 150 mcg by mouth daily.      [provider]  meloxicam (MOBIC) 15 MG tablet Take 1 tablet (15 mg total) by mouth daily. 02/09/17   Felecia Shelling, DPM  oxyCODONE-acetaminophen (PERCOCET) 5-325 MG per tablet Take 1 tablet by mouth every 4 (four) hours as needed for severe pain. 04/12/14   [name], MD  traMADol (ULTRAM) 50 MG tablet Take 1 tablet (50 mg total) by mouth every 8 (eight) hours as needed. 12/18/16   Felecia Shelling, DPM  traMADol (ULTRAM) 50 MG tablet Take 1 tablet (50 mg total) by mouth every 8 (eight) hours as needed. 01/31/17   Felecia Shelling,  DPM  traMADol (ULTRAM) 50 MG tablet Take one tablet every 8 hours prn foot pain. 05/10/17   Felecia Shelling, DPM    Family History Family History  Problem Relation Age of Onset  . Heart failure Mother   . Heart disease Mother   . Breast cancer Maternal Grandmother   . Colon cancer Neg Hx   . Colon polyps Neg Hx   . Esophageal cancer Neg Hx   . Rectal cancer Neg Hx   . Stomach cancer Neg Hx     Social History Social History   Tobacco Use  . Smoking status: Never Smoker  . Smokeless tobacco: Never Used  Substance Use Topics  . Alcohol use: Yes    Comment: occasionally  . Drug use: No     Allergies   Patient has no known allergies.   Review of Systems Review of Systems  Constitutional: Negative for chills and fever.  HENT: Negative for ear pain and sore throat.   Eyes: Negative for pain and visual disturbance.  Respiratory: Negative for cough and shortness of breath.   Cardiovascular: Negative for chest pain and palpitations.  Gastrointestinal: Negative for abdominal pain and vomiting.  Genitourinary: Negative for dysuria and hematuria.  Musculoskeletal: Negative for arthralgias and back pain.       Pain to right shoulder  Skin: Negative for color change and rash.  Neurological: Negative for seizures and syncope.  All other systems reviewed and are negative.    Physical Exam Triage Vital Signs ED Triage Vitals [07/14/17 1507]  Enc Vitals Group     BP 136/85     Pulse Rate 65     Resp 18     Temp 98.3 F (36.8 C)     Temp src      SpO2 100 %     Weight      Height      Head Circumference      Peak Flow      Pain Score      Pain Loc      Pain Edu?      Excl. in GC?    No data found.  Updated Vital Signs BP 136/85   Pulse 65   Temp 98.3 F (36.8 C)   Resp 18   LMP 04/03/2011   SpO2 100%   Visual Acuity Right Eye Distance:   Left Eye Distance:   Bilateral Distance:    Right Eye Near:   Left Eye Near:    Bilateral Near:     Physical Exam   Constitutional: She is oriented to person, place, and time. She appears well-developed and well-nourished.  HENT:  Head: Normocephalic and atraumatic.  Eyes: Conjunctivae are normal.  Neck: Normal range of motion.  Pulmonary/Chest: Effort normal.  Musculoskeletal: She exhibits tenderness. She exhibits no edema (to scapular notch) or deformity.  Unable to lift arm above shoulder  Neurological: She is alert and oriented to person, place, and time.  Skin: Skin is warm.  Warn dry, pulses intact cap refill < 2 to affected extremity  Psychiatric: She has a normal mood and affect.  Nursing note and vitals reviewed.    UC Treatments / Results  Labs (all labs ordered are listed, but only abnormal results are displayed) Labs Reviewed - No data to display  EKG None  Radiology Dg Shoulder Right  Result Date: 07/14/2017 CLINICAL DATA:  Patient states that she felt a "pop" in her right shoulder when she bent down to pick up something today, some discomfort. No Injury/fall No hx EXAM: RIGHT SHOULDER - 2+ VIEW COMPARISON:  None. FINDINGS: No fracture.  No bone lesion. Glenohumeral joint is normally spaced and aligned. Mild marginal spurring is noted at the [facility] joint consistent with mild osteoarthritis. Soft tissues are unremarkable. IMPRESSION: 1. No fracture or dislocation. 2. Mild AC joint osteoarthritis. Electronically Signed   By: Amie Portlandavid  Ormond M.D.   On: 07/14/2017 15:55    Procedures Procedures (including critical care time)  Medications Ordered in UC Medications - No data to display  Initial Impression / Assessment and Plan / UC Course  I have reviewed the triage vital signs and the nursing notes.  Pertinent labs & imaging results that were available during my care of the patient were reviewed by me and considered in my medical decision making (see chart for details).      Final Clinical Impressions(s) / UC Diagnoses   Final diagnoses:  None   Discharge Instructions   None     ED Prescriptions    None     Controlled Substance Prescriptions [place] Controlled Substance Registry consulted? Not Applicable   Alene MiresOmohundro, Madeliene Tejera C, NP 07/14/17 1606

## 2017-07-14 NOTE — ED Triage Notes (Signed)
Pt c/o R shoulder pain, states she reached down and felt something pop in her R shoulder.

## 2017-07-30 ENCOUNTER — Telehealth: Payer: Self-pay | Admitting: *Deleted

## 2017-07-30 NOTE — Telephone Encounter (Signed)
Refill request for Meloxicam. Dr. Logan BoresEvans states pt needs an appt. Return fax denying.

## 2017-11-20 ENCOUNTER — Other Ambulatory Visit: Payer: Medicare Other

## 2017-12-04 ENCOUNTER — Encounter: Payer: Self-pay | Admitting: Internal Medicine

## 2017-12-04 ENCOUNTER — Ambulatory Visit (INDEPENDENT_AMBULATORY_CARE_PROVIDER_SITE_OTHER): Payer: Medicare Other | Admitting: Internal Medicine

## 2017-12-04 VITALS — BP 123/81 | HR 57 | Temp 97.9°F | Wt 274.0 lb

## 2017-12-04 DIAGNOSIS — Z7189 Other specified counseling: Secondary | ICD-10-CM

## 2017-12-04 DIAGNOSIS — Z113 Encounter for screening for infections with a predominantly sexual mode of transmission: Secondary | ICD-10-CM | POA: Diagnosis not present

## 2017-12-04 DIAGNOSIS — Z7185 Encounter for immunization safety counseling: Secondary | ICD-10-CM

## 2017-12-04 DIAGNOSIS — Z5181 Encounter for therapeutic drug level monitoring: Secondary | ICD-10-CM

## 2017-12-04 DIAGNOSIS — Z79899 Other long term (current) drug therapy: Secondary | ICD-10-CM | POA: Diagnosis not present

## 2017-12-04 DIAGNOSIS — B20 Human immunodeficiency virus [HIV] disease: Secondary | ICD-10-CM

## 2017-12-04 MED ORDER — ELVITEG-COBIC-EMTRICIT-TENOFAF 150-150-200-10 MG PO TABS: 1.0000 | ORAL_TABLET | Freq: Every day | ORAL | 11 refills | Status: DC

## 2017-12-04 NOTE — Assessment & Plan Note (Signed)
Discussed flu shot and other vaccines.  She refuses all.

## 2017-12-04 NOTE — Addendum Note (Signed)
Addended by: Mariea ClontsGREEN, Pippa Hanif D on: 12/04/2017 02:29 PM   Modules accepted: Orders

## 2017-12-04 NOTE — Assessment & Plan Note (Signed)
Will check creat. 

## 2017-12-04 NOTE — Assessment & Plan Note (Signed)
Will screen today 

## 2017-12-04 NOTE — Assessment & Plan Note (Signed)
Will check her labs today and if ok, rtc 1 year

## 2017-12-04 NOTE — Progress Notes (Signed)
   Subjective:    Patient ID: Onnie GrahamJoann Lichtenberger, female    DOB: 1955-11-01, 62 y.o.   MRN: [id number]  HPI Here for follow up of HIV Here for her yearly follow up.  Continues on Genvoya.  No labs prior to the visit.  No associated n/v/d.  No missed doses.  No new issues. No new medications.    Review of Systems  Constitutional: Negative for fatigue.  Gastrointestinal: Negative for diarrhea.  Skin: Negative for rash.       Objective:   Physical Exam  Constitutional: She appears well-developed and well-nourished. No distress.  HENT:  Mouth/Throat: No oropharyngeal exudate.  Eyes: No scleral icterus.  Cardiovascular: Normal rate, regular rhythm and normal heart sounds.  No murmur heard. Pulmonary/Chest: Effort normal and breath sounds normal. No respiratory distress.  Skin: No rash noted.   SH: no tobacco history       Assessment & Plan:

## 2017-12-05 LAB — T-HELPER CELL (CD4) - (RCID CLINIC ONLY)
CD4 % Helper T Cell: 36 % (ref 33–55)
CD4 T CELL ABS: 870 /uL (ref 400–2700)

## 2017-12-06 LAB — COMPLETE METABOLIC PANEL WITH GFR
AG Ratio: 1.4 (calc) (ref 1.0–2.5)
ALT: 14 U/L (ref 6–29)
AST: 16 U/L (ref 10–35)
Albumin: 4.2 g/dL (ref 3.6–5.1)
Alkaline phosphatase (APISO): 89 U/L (ref 33–130)
BUN: 16 mg/dL (ref 7–25)
CALCIUM: 9.7 mg/dL (ref 8.6–10.4)
CO2: 25 mmol/L (ref 20–32)
CREATININE: 0.87 mg/dL (ref 0.50–0.99)
Chloride: 106 mmol/L (ref 98–110)
GFR, EST AFRICAN AMERICAN: 83 mL/min/{1.73_m2} (ref >=60)
GFR, EST NON AFRICAN AMERICAN: 71 mL/min/{1.73_m2} (ref >=60)
GLOBULIN: 3.1 g/dL (ref 1.9–3.7)
Glucose, Bld: 87 mg/dL (ref 65–99)
Potassium: 4.1 mmol/L (ref 3.5–5.3)
SODIUM: 139 mmol/L (ref 135–146)
TOTAL PROTEIN: 7.3 g/dL (ref 6.1–8.1)
Total Bilirubin: 0.5 mg/dL (ref 0.2–1.2)

## 2017-12-06 LAB — CBC WITH DIFFERENTIAL/PLATELET
BASOS ABS: 32 {cells}/uL (ref 0–200)
Basophils Relative: 0.5 %
EOS ABS: 158 {cells}/uL (ref 15–500)
EOS PCT: 2.5 %
HCT: 35.5 % (ref 35.0–45.0)
HEMOGLOBIN: 11.6 g/dL — AB (ref 11.7–15.5)
Lymphs Abs: 2451 cells/uL (ref 850–3900)
MCH: 28.6 pg (ref 27.0–33.0)
MCHC: 32.7 g/dL (ref 32.0–36.0)
MCV: 87.4 fL (ref 80.0–100.0)
MONOS PCT: 7.1 %
MPV: 10.3 fL (ref 7.5–12.5)
NEUTROS ABS: 3213 {cells}/uL (ref 1500–7800)
NEUTROS PCT: 51 %
Platelets: 287 10*3/uL (ref 140–400)
RBC: 4.06 10*6/uL (ref 3.80–5.10)
RDW: 13.3 % (ref 11.0–15.0)
TOTAL LYMPHOCYTE: 38.9 %
WBC mixed population: 447 cells/uL (ref 200–950)
WBC: 6.3 10*3/uL (ref 3.8–10.8)

## 2017-12-06 LAB — RPR: RPR Ser Ql: NONREACTIVE

## 2017-12-06 LAB — LIPID PANEL
CHOL/HDL RATIO: 2.2 (calc) (ref <5.0)
CHOLESTEROL: 200 mg/dL — AB (ref <200)
HDL: 89 mg/dL (ref >50)
LDL Cholesterol (Calc): 93 mg/dL (calc)
Non-HDL Cholesterol (Calc): 111 mg/dL (calc) (ref <130)
Triglycerides: 90 mg/dL (ref <150)

## 2017-12-06 LAB — HIV-1 RNA QUANT-NO REFLEX-BLD
HIV 1 RNA Quant: <20 copies/mL
HIV-1 RNA QUANT, LOG: NOT DETECTED {Log_copies}/mL

## 2018-01-22 ENCOUNTER — Encounter: Payer: Self-pay | Admitting: Interventional Cardiology

## 2018-02-07 NOTE — Progress Notes (Signed)
Cardiology Office Note:    Date:  02/08/2018   ID:  Miranda GrahamJoann Kelley, DOB 20-Nov-1955, MRN [id number]  PCP:  Leilani Ableeese, Betti, MD  Cardiologist:  No primary care provider on file.   Referring MD: Leilani Ableeese, Betti, MD   Chief Complaint  Patient presents with  . Hypertension  . Advice Only    Primary prevention    History of Present Illness:    Miranda GrahamJoann Lage is a [age] y.o. female with a hx of obesity, dyspnea, family h/o CAD, and hypertension.  She has a significant family history heart failure and CAD.  She is here for primary prevention.  Denies syncope.  There is no orthopnea, PND, palpitations.  She does have lower extremity swelling.  On exam today there is no evidence of edema.  She is not exercising.  She is not watching her diet.  She has gained more than 20 pounds. She has DOE which she feels is deconditioning and weight related.   Past Medical History:  Diagnosis Date  . Anxiety   . Arthritis    knees  . Cataract    surgery - bilateral  . Chronic sinus infection   . HIV infection (HCC)   . Hypertension    hx - no longer a problem - weight loss surgery -lost 100lbs  . Thyroid disease     Past Surgical History:  Procedure Laterality Date  . cataract surgery Bilateral   . CHOLECYSTECTOMY  06/05/2011   Procedure: LAPAROSCOPIC CHOLECYSTECTOMY WITH INTRAOPERATIVE CHOLANGIOGRAM;  Surgeon: Mariella SaaBenjamin T Hoxworth, MD;  Location: [facility];  Service: General;  Laterality: N/A;  . JOINT REPLACEMENT  06/2009   right knee  . LAPAROSCOPIC GASTRIC SLEEVE RESECTION  2015  . TOTAL HIP ARTHROPLASTY      Current Medications: Current Meds  Medication Sig  . bisacodyl (DULCOLAX) 5 MG EC tablet Take 5 mg by mouth daily as needed for moderate constipation. Dulcolax 5 mg tab take as directed for colonoscopy prep.  . Cholecalciferol (VITAMIN D3) 2000 units TABS Take 2,000 Units by mouth daily.   . clobetasol ointment (TEMOVATE) 0.05 % Reported on 03/04/2015  . cyclobenzaprine (FLEXERIL) 10 MG tablet Take 0.5  tablets (5 mg total) by mouth 2 (two) times daily as needed for muscle spasms.  [name]. elvitegravir-cobicistat-emtricitabine-tenofovir (GENVOYA) 150-150-200-10 MG TABS tablet Take 1 tablet by mouth daily.  [name]. levothyroxine (SYNTHROID, LEVOTHROID) 150 MCG tablet Take 150 mcg by mouth daily.    . meloxicam (MOBIC) 15 MG tablet Take 1 tablet (15 mg total) by mouth daily.  . traMADol (ULTRAM) 50 MG tablet Take one tablet every 8 hours prn foot pain.     Allergies:   Patient has no known allergies.   Social History   Socioeconomic History  . Marital status: Widowed    Spouse name: Not on file  . Number of children: Not on file  . Years of education: Not on file  . Highest education level: Not on file  Occupational History  . Not on file  Social Needs  . Financial resource strain: Not on file  . Food insecurity:    Worry: Not on file    Inability: Not on file  . Transportation needs:    Medical: Not on file    Non-medical: Not on file  Tobacco Use  . Smoking status: Never Smoker  . Smokeless tobacco: Never Used  Substance and Sexual Activity  . Alcohol use: Yes    Comment: occasionally  . Drug use: No  . Sexual activity:  Not on file    Comment: pt. given condoms  Lifestyle  . Physical activity:    Days per week: Not on file    Minutes per session: Not on file  . Stress: Not on file  Relationships  . Social connections:    Talks on phone: Not on file    Gets together: Not on file    Attends religious service: Not on file    Active member of club or organization: Not on file    Attends meetings of clubs or organizations: Not on file    Relationship status: Not on file  Other Topics Concern  . Not on file  Social History Narrative  . Not on file     Family History: The patient's family history includes Breast cancer in her maternal grandmother; Heart disease in her mother; Heart failure in her mother. There is no history of Colon cancer, Colon polyps, Esophageal cancer,  Rectal cancer, or Stomach cancer.  ROS:   Please see the history of present illness.    She has shortness of breath with activity.  All other systems reviewed and are negative.  EKGs/Labs/Other Studies Reviewed:    The following studies were reviewed today: No imaging or functional studies.  EKG:  EKG normal sinus rhythm, increased voltage consistent with LVH.  Left atrial abnormality.  Incomplete right bundle.  Nonspecific ST-T wave abnormality.  Recent Labs: 12/04/2017: ALT 14; BUN 16; Creat 0.87; Hemoglobin 11.6; Platelets 287; Potassium 4.1; Sodium 139  Recent Lipid Panel    Component Value Date/Time   CHOL 200 (H) 12/04/2017 1201   TRIG 90 12/04/2017 1201   HDL 89 12/04/2017 1201   CHOLHDL 2.2 12/04/2017 1201   VLDL 21 01/06/2016 1451   LDLCALC 93 12/04/2017 1201    Physical Exam:    VS:  BP 120/78   Pulse 79   Ht 5\' 5"  (1.651 m)   Wt 273 lb 3.2 oz (123.9 kg)   LMP 04/03/2011   SpO2 98%   BMI 45.46 kg/m     Wt Readings from Last 3 Encounters:  02/08/18 273 lb 3.2 oz (123.9 kg)  12/04/17 274 lb (124.3 kg)  11/30/16 265 lb (120.2 kg)     GEN: Obese.. No acute distress HEENT: Normal NECK: No JVD. LYMPHATICS: No lymphadenopathy CARDIAC: RRR.  No murmur, no gallop, no edema VASCULAR: 2+ bilateral radial and carotid pulses, no bruits RESPIRATORY:  Clear to auscultation without rales, wheezing or rhonchi  ABDOMEN: Soft, non-tender, non-distended, No pulsatile mass, MUSCULOSKELETAL: No deformity  SKIN: Warm and dry NEUROLOGIC:  Alert and oriented x 3 PSYCHIATRIC:  Normal affect   ASSESSMENT:    1. Essential hypertension   2. PVC (premature ventricular contraction)   3. Morbid obesity (HCC)   4. Human immunodeficiency virus (HIV) disease (HCC)    PLAN:    In order of problems listed above:  1. Low-salt diet, weight loss, alcohol avoidance, moderate aerobic activity, low carbohydrate diet well discussed as additional measures to maintain reasonable blood  pressure.  Her pressure is not currently at a level that requires medicinal management. 2. Not currently an issue 3. Weight loss is discussed.  She is scheduled to start a moderate exercise program this coming Monday.  Continue with efforts at primary cardiovascular prevention. Overall education and awareness concerning primary risk prevention was discussed in detail: LDL less than 70, hemoglobin A1c less than 7, blood pressure target less than 130/80 mmHg, >150 minutes of moderate aerobic activity per week, avoidance  of smoking, weight control (via diet and exercise), and continued surveillance/management of/for obstructive sleep apnea.    Medication Adjustments/Labs and Tests Ordered: Current medicines are reviewed at length with the patient today.  Concerns regarding medicines are outlined above.  Orders Placed This Encounter  Procedures  . EKG 12-Lead   No orders of the defined types were placed in this encounter.   Patient Instructions  Medication Instructions:  Your physician recommends that you continue on your current medications as directed. Please refer to the Current Medication list given to you today.  If you need a refill on your cardiac medications before your next appointment, please call your pharmacy.   Lab work: None If you have labs (blood work) drawn today and your tests are completely normal, you will receive your results only by: [name] MyChart Message (if you have MyChart) OR . A paper copy in the mail If you have any lab test that is abnormal or we need to change your treatment, we will call you to review the results.  Testing/Procedures: None  Follow-Up: At [facility], you and your health needs are our priority.  As part of our continuing mission to provide you with exceptional heart care, we have created designated Provider Care Teams.  These Care Teams include your primary Cardiologist (physician) and Advanced Practice Providers (APPs -  Physician  Assistants and Nurse Practitioners) who all work together to provide you with the care you need, when you need it. You will need a follow up appointment in 12-18 months.  Please call our office 2 months in advance to schedule this appointment.  You may see Dr. Katrinka Blazing or one of the following Advanced Practice Providers on your designated Care Team:   Norma Fredrickson, NP Nada Boozer, NP . Georgie Chard, NP  Any Other Special Instructions Will Be Listed Below (If Applicable).       Signed, Lesleigh Noe, MD  02/08/2018 12:21 PM    [facility]

## 2018-02-08 ENCOUNTER — Encounter: Payer: Self-pay | Admitting: Interventional Cardiology

## 2018-02-08 ENCOUNTER — Ambulatory Visit (INDEPENDENT_AMBULATORY_CARE_PROVIDER_SITE_OTHER): Payer: Medicare Other | Admitting: Interventional Cardiology

## 2018-02-08 VITALS — BP 120/78 | HR 79 | Ht 65.0 in | Wt 273.2 lb

## 2018-02-08 DIAGNOSIS — B20 Human immunodeficiency virus [HIV] disease: Secondary | ICD-10-CM

## 2018-02-08 DIAGNOSIS — I10 Essential (primary) hypertension: Secondary | ICD-10-CM

## 2018-02-08 DIAGNOSIS — I493 Ventricular premature depolarization: Secondary | ICD-10-CM | POA: Diagnosis not present

## 2018-02-08 NOTE — Patient Instructions (Signed)
Medication Instructions:  Your physician recommends that you continue on your current medications as directed. Please refer to the Current Medication list given to you today.  If you need a refill on your cardiac medications before your next appointment, please call your pharmacy.   Lab work: None If you have labs (blood work) drawn today and your tests are completely normal, you will receive your results only by: [name] MyChart Message (if you have MyChart) OR . A paper copy in the mail If you have any lab test that is abnormal or we need to change your treatment, we will call you to review the results.  Testing/Procedures: None  Follow-Up: At [facility], you and your health needs are our priority.  As part of our continuing mission to provide you with exceptional heart care, we have created designated Provider Care Teams.  These Care Teams include your primary Cardiologist (physician) and Advanced Practice Providers (APPs -  Physician Assistants and Nurse Practitioners) who all work together to provide you with the care you need, when you need it. You will need a follow up appointment in 12-18 months.  Please call our office 2 months in advance to schedule this appointment.  You may see Dr. Katrinka Blazing or one of the following Advanced Practice Providers on your designated Care Team:   Norma Fredrickson, NP Nada Boozer, NP . Georgie Chard, NP  Any Other Special Instructions Will Be Listed Below (If Applicable).

## 2018-02-28 ENCOUNTER — Ambulatory Visit (INDEPENDENT_AMBULATORY_CARE_PROVIDER_SITE_OTHER): Payer: Medicare Other | Admitting: Nurse Practitioner

## 2018-02-28 ENCOUNTER — Encounter: Payer: Self-pay | Admitting: Nurse Practitioner

## 2018-02-28 ENCOUNTER — Other Ambulatory Visit (HOSPITAL_COMMUNITY)
Admission: RE | Admit: 2018-02-28 | Discharge: 2018-02-28 | Disposition: A | Discharge status: Home / Self Care | Payer: Medicare Other | Source: Ambulatory Visit | Attending: Nurse Practitioner | Admitting: Nurse Practitioner

## 2018-02-28 VITALS — BP 127/85 | HR 71 | Wt 273.8 lb

## 2018-02-28 DIAGNOSIS — N898 Other specified noninflammatory disorders of vagina: Secondary | ICD-10-CM

## 2018-02-28 NOTE — Progress Notes (Signed)
   GYNECOLOGY OFFICE VISIT NOTE   History:  63 y.o. G5P0 here today for vaginal odor. She denies any abnormal vaginal discharge, bleeding, pelvic pain or other concerns.   She has a PCP which did her annual exam and pap last year and it was normal.  She is coming for vaginal odor that she has had for several weeks.   Does not douche.  Currently is not sexually active.  Past Medical History:  Diagnosis Date  . Anxiety   . Arthritis    knees  . Cataract    surgery - bilateral  . Chronic sinus infection   . HIV infection (HCC)   . Hypertension    hx - no longer a problem - weight loss surgery -lost 100lbs  . Thyroid disease     Past Surgical History:  Procedure Laterality Date  . cataract surgery Bilateral   . CHOLECYSTECTOMY  06/05/2011   Procedure: LAPAROSCOPIC CHOLECYSTECTOMY WITH INTRAOPERATIVE CHOLANGIOGRAM;  Surgeon: Mariella Saa, MD;  Location: [facility];  Service: General;  Laterality: N/A;  . JOINT REPLACEMENT  06/2009   right knee  . LAPAROSCOPIC GASTRIC SLEEVE RESECTION  2015  . TOTAL HIP ARTHROPLASTY      The following portions of the patient's history were reviewed and updated as appropriate: allergies, current medications, past family history, past medical history, past social history, past surgical history and problem list.   Health Maintenance:  Normal pap in 2019.  Normal mammogram in 2018,.   Review of Systems:  Pertinent items noted in HPI and remainder of comprehensive ROS otherwise negative.  Objective:  Physical Exam BP 127/85   Pulse 71   Wt 273 lb 12.8 oz (124.2 kg)   LMP 04/03/2011   BMI 45.56 kg/m  CONSTITUTIONAL: Well-developed, well-nourished female in no acute distress.  HENT:  Normocephalic, atraumatic. External right and left ear normal.  EYES: Conjunctivae and EOM are normal. Pupils are equal, round.  No scleral icterus.  NECK: Normal range of motion, supple, no masses SKIN: Skin is warm and dry. No rash noted. Not diaphoretic. No  erythema. No pallor. NEUROLOGIC: Alert and oriented to person, place, and time. Normal muscle tone coordination. No cranial nerve deficit noted. PSYCHIATRIC: Normal mood and affect. Normal behavior. Normal judgment and thought content. CARDIOVASCULAR: Normal heart rate noted RESPIRATORY: Effort and breath sounds normal, no problems with respiration noted ABDOMEN: Soft, no distention noted.   PELVIC: no abnormality seen on external genitalia.  speculum exam - cervix normal, hardly any vaginal discharge seen at all.  No odor noted by provider.   MUSCULOSKELETAL: Normal range of motion. No edema noted.  Labs and Imaging No results found.  Assessment & Plan:  1. Vaginal discharge Awaiting results and will treat as indicated.   - Cervicovaginal ancillary only( [place])   Routine preventative health maintenance measures emphasized. Please refer to After Visit Summary for other counseling recommendations.   Return as needed.   Total face-to-face time with patient: 15 minutes.  Over 50% of encounter was spent on counseling and coordination of care.  Nolene Bernheim, RN, MSN, NP-BC Nurse Practitioner, [facility] for [employer], [facility] 02/28/2018 2:15 PM

## 2018-02-28 NOTE — Progress Notes (Signed)
Pt is here for vaginal odor she has had off and on for a couple months. Pt is post menopausal, not currently SA. Pt states she had annual exam and pap smear last year, pap was normal.

## 2018-03-01 LAB — CERVICOVAGINAL ANCILLARY ONLY
BACTERIAL VAGINITIS: NEGATIVE
Candida vaginitis: NEGATIVE
Chlamydia: NEGATIVE
NEISSERIA GONORRHEA: NEGATIVE
TRICH (WINDOWPATH): NEGATIVE

## 2018-09-10 ENCOUNTER — Other Ambulatory Visit: Payer: Self-pay | Admitting: *Deleted

## 2018-09-10 DIAGNOSIS — Z20822 Contact with and (suspected) exposure to covid-19: Secondary | ICD-10-CM

## 2018-09-11 LAB — NOVEL CORONAVIRUS, NAA: SARS-CoV-2, NAA: NOT DETECTED

## 2018-09-21 ENCOUNTER — Other Ambulatory Visit: Payer: Self-pay

## 2018-09-21 DIAGNOSIS — Z20822 Contact with and (suspected) exposure to covid-19: Secondary | ICD-10-CM

## 2018-09-22 LAB — NOVEL CORONAVIRUS, NAA: SARS-CoV-2, NAA: NOT DETECTED

## 2018-09-24 ENCOUNTER — Other Ambulatory Visit: Payer: Self-pay

## 2018-09-24 DIAGNOSIS — Z20822 Contact with and (suspected) exposure to covid-19: Secondary | ICD-10-CM

## 2018-09-25 LAB — NOVEL CORONAVIRUS, NAA: SARS-CoV-2, NAA: NOT DETECTED

## 2018-11-18 ENCOUNTER — Other Ambulatory Visit: Payer: Self-pay

## 2018-11-18 ENCOUNTER — Encounter (HOSPITAL_COMMUNITY): Payer: Self-pay

## 2018-11-18 ENCOUNTER — Ambulatory Visit (HOSPITAL_COMMUNITY)
Admission: EM | Admit: 2018-11-18 | Discharge: 2018-11-18 | Disposition: A | Discharge status: Home / Self Care | Payer: 59 | Attending: Internal Medicine | Admitting: Internal Medicine

## 2018-11-18 DIAGNOSIS — R0789 Other chest pain: Secondary | ICD-10-CM | POA: Diagnosis not present

## 2018-11-18 MED ORDER — ALUM & MAG HYDROXIDE-SIMETH 200-200-20 MG/5ML PO SUSP
ORAL | Status: AC
  Filled 2018-11-18: qty 30

## 2018-11-18 MED ORDER — LIDOCAINE VISCOUS HCL 2 % MT SOLN
OROMUCOSAL | Status: AC
  Filled 2018-11-18: qty 15

## 2018-11-18 MED ORDER — NAPROXEN 500 MG PO TABS: 500.0000 mg | ORAL_TABLET | Freq: Two times a day (BID) | ORAL | 0 refills | Status: DC

## 2018-11-18 MED ORDER — ALUM & MAG HYDROXIDE-SIMETH 200-200-20 MG/5ML PO SUSP
30.0000 mL | Freq: Once | ORAL | Status: AC
  Administered 2018-11-18: 30 mL via ORAL

## 2018-11-18 MED ORDER — LIDOCAINE VISCOUS HCL 2 % MT SOLN
15.0000 mL | Freq: Once | OROMUCOSAL | Status: AC
  Administered 2018-11-18: 15 mL via ORAL

## 2018-11-18 NOTE — ED Triage Notes (Signed)
Pt states having sharp chest pain and pinching in her chest 1 hr ago. Pt states she is having numbness and left arm pain started 1 hr ago.

## 2018-11-18 NOTE — Discharge Instructions (Addendum)
Continue Maalox or tums as needed for gas/reflux Naprosyn twice daily for the next week  Follow up with cardiologist and primary care If chest pain worsening follow up in emergency room

## 2018-11-18 NOTE — ED Notes (Signed)
Pt vital sign and EKG were reported to provider H. Wieters.

## 2018-11-18 NOTE — ED Provider Notes (Signed)
[facility]    CSN: [id number] Arrival date & time: 11/18/18  1625      History   Chief Complaint Chief Complaint  Patient presents with  . Chest Pain  . Numbness    HPI Miranda Kelley is a 63 y.o. female history of hypertension, HIV, presenting today for evaluation of chest pain.  Patient states that approximately 1 hour ago she began to develop a sharp pain in her left chest that would radiate into her neck and left arm.  She also describes having a numbness sensation.  Patient was driving at the time of onset.  Pain has slightly improved since she has been here.  Further describes it as a pins-and-needles sensation.  Mild associated shortness of breath.  Denies palpitations.  She does state that today she was lifting propane tanks as well as started Zumba which is new exercise for her.  Has previously been on medicine for blood pressure, but is no longer on blood pressure medicine.  She denies history of diabetes.  Denies tobacco use.  Does note family history of CAD and CHF.  Initially thought symptoms related to gas, tried taking some Tums.  HPI  Past Medical History:  Diagnosis Date  . Anxiety   . Arthritis    knees  . Cataract    surgery - bilateral  . Chronic sinus infection   . HIV infection (HCC)   . Hypertension    hx - no longer a problem - weight loss surgery -lost 100lbs  . Thyroid disease     Patient Active Problem List   Diagnosis Date Noted  . Vaccine counseling 11/30/2016  . Osteoporosis 01/10/2016  . Medication monitoring encounter 01/10/2016  . Encounter for long-term (current) use of medications 04/23/2013  . Screening examination for venereal disease 04/23/2013  . Depressive episode 09/19/2011  . Symptomatic cholelithiasis 06/04/2011  . HTN (hypertension) 02/02/2011  . Human immunodeficiency virus (HIV) disease (HCC) 12/01/2008  . PVC (premature ventricular contraction) 11/19/2008  . ELEVATED BLOOD PRESSURE WITHOUT DIAGNOSIS OF  HYPERTENSION 09/16/2008  . DENTAL CARIES 07/01/2008  . OSTEOARTHRITIS, HIP, RIGHT 08/24/2007  . KNEE PAIN, RIGHT 06/21/2007  . FATIGUE 06/21/2007  . GERD 12/18/2006  . ANEMIA 10/08/2006  . Obesity 10/05/2006  . Pain in joint, ankle and foot 10/05/2006  . 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 Hypothyroidism 09/28/2006    Past Surgical History:  Procedure Laterality Date  . cataract surgery Bilateral   . CHOLECYSTECTOMY  06/05/2011   Procedure: LAPAROSCOPIC CHOLECYSTECTOMY WITH INTRAOPERATIVE CHOLANGIOGRAM;  Surgeon: Mariella Saa, MD;  Location: [facility];  Service: General;  Laterality: N/A;  . JOINT REPLACEMENT  06/2009   right knee  . LAPAROSCOPIC GASTRIC SLEEVE RESECTION  2015  . TOTAL HIP ARTHROPLASTY      OB History    Gravida  5   Para      Term      Preterm      AB      Living  4     SAB      TAB      Ectopic      Multiple      Live Births               Home Medications    Prior to Admission medications   Medication Sig Start Date End Date Taking? Authorizing Provider  ALPRAZolam Prudy Feeler) 1 MG tablet Take 1 mg by mouth 3 (three) times daily. 11/07/18   [provider]  bisacodyl (DULCOLAX)  5 MG EC tablet Take 5 mg by mouth daily as needed for moderate constipation. Dulcolax 5 mg tab take as directed for colonoscopy prep.    [provider]  Cholecalciferol (VITAMIN D3) 2000 units TABS Take 2,000 Units by mouth daily.     [provider]  clobetasol ointment (TEMOVATE) 0.05 % Reported on 03/04/2015 02/22/12   [provider]  elvitegravir-cobicistat-emtricitabine-tenofovir (GENVOYA) 150-150-200-10 MG TABS tablet Take 1 tablet by mouth daily. 12/04/17   Comer, Belia Heman, MD  levothyroxine (SYNTHROID, LEVOTHROID) 150 MCG tablet Take 150 mcg by mouth daily.      [provider]  naproxen (NAPROSYN) 500 MG tablet Take 1 tablet (500 mg total) by mouth 2 (two) times daily. With food 11/18/18   Mellina Benison, [employer] C, PA-C   oxyCODONE-acetaminophen (PERCOCET) 10-325 MG tablet Take 1 tablet by mouth every 6 (six) hours as needed. 11/11/18   [provider]  traMADol (ULTRAM) 50 MG tablet Take one tablet every 8 hours prn foot pain. 05/10/17   Felecia Shelling, DPM  traZODone (DESYREL) 150 MG tablet Take 150 mg by mouth daily. 10/24/18   [provider]    Family History Family History  Problem Relation Age of Onset  . Heart failure Mother   . Heart disease Mother   . Breast cancer Maternal Grandmother   . Colon cancer Neg Hx   . Colon polyps Neg Hx   . Esophageal cancer Neg Hx   . Rectal cancer Neg Hx   . Stomach cancer Neg Hx     Social History Social History   Tobacco Use  . Smoking status: Never Smoker  . Smokeless tobacco: Never Used  Substance Use Topics  . Alcohol use: Yes    Comment: occasionally  . Drug use: No     Allergies   Patient has no known allergies.   Review of Systems Review of Systems  Constitutional: Negative for fatigue and fever.  HENT: Negative for congestion, sinus pressure and sore throat.   Eyes: Negative for photophobia, pain and visual disturbance.  Respiratory: Negative for cough and shortness of breath.   Cardiovascular: Positive for chest pain. Negative for leg swelling.  Gastrointestinal: Negative for abdominal pain, nausea and vomiting.  Genitourinary: Negative for decreased urine volume and hematuria.  Musculoskeletal: Negative for myalgias, neck pain and neck stiffness.  Neurological: Negative for dizziness, syncope, facial asymmetry, speech difficulty, weakness, light-headedness, numbness and headaches.     Physical Exam Triage Vital Signs ED Triage Vitals  Enc Vitals Group     BP 11/18/18 1650 (!) 148/79     Pulse Rate 11/18/18 1650 70     Resp 11/18/18 1650 18     Temp 11/18/18 1650 98.4 F (36.9 C)     Temp Source 11/18/18 1650 Oral     SpO2 11/18/18 1650 99 %     Weight --      Height --      Head Circumference --       Peak Flow --      Pain Score 11/18/18 1711 4     Pain Loc --      Pain Edu? --      Excl. in GC? --    No data found.  Updated Vital Signs BP (!) 148/79 (BP Location: Right Arm)   Pulse 70   Temp 98.4 F (36.9 C) (Oral)   Resp 18   LMP 04/03/2011   SpO2 99%   Visual Acuity Right Eye Distance:  Left Eye Distance:   Bilateral Distance:    Right Eye Near:   Left Eye Near:    Bilateral Near:     Physical Exam Vitals signs and nursing note reviewed.  Constitutional:      General: She is not in acute distress.    Appearance: She is well-developed.  HENT:     Head: Normocephalic and atraumatic.     Mouth/Throat:     Comments: Oral mucosa pink and moist, no tonsillar enlargement or exudate. Posterior pharynx patent and nonerythematous, no uvula deviation or swelling. Normal phonation. Palate elevates symmetrically Eyes:     Extraocular Movements: Extraocular movements intact.     Conjunctiva/sclera: Conjunctivae normal.     Pupils: Pupils are equal, round, and reactive to light.  Neck:     Musculoskeletal: Neck supple.  Cardiovascular:     Rate and Rhythm: Normal rate and regular rhythm.     Heart sounds: No murmur.     Comments: No carotid bruits auscultated bilaterally Pulmonary:     Effort: Pulmonary effort is normal. No respiratory distress.     Breath sounds: Normal breath sounds.     Comments: Breathing comfortably at rest, CTABL, no wheezing, rales or other adventitious sounds auscultated Abdominal:     Palpations: Abdomen is soft.     Tenderness: There is no abdominal tenderness.     Comments: Nontender to light deep palpation throughout abdomen  Musculoskeletal:     Comments: Anterior chest diffusely tender throughout left side extending into left superior trapezius musculature  Skin:    General: Skin is warm and dry.  Neurological:     General: No focal deficit present.     Mental Status: She is alert and oriented to person, place, and time.      UC  Treatments / Results  Labs (all labs ordered are listed, but only abnormal results are displayed) Labs Reviewed - No data to display  EKG   Radiology No results found.  Procedures Procedures (including critical care time)  Medications Ordered in UC Medications  alum & mag hydroxide-simeth (MAALOX/MYLANTA) 200-200-20 MG/5ML suspension 30 mL (30 mLs Oral Given 11/18/18 1731)    And  lidocaine (XYLOCAINE) 2 % viscous mouth solution 15 mL (15 mLs Oral Given 11/18/18 1731)  alum & mag hydroxide-simeth (MAALOX/MYLANTA) 200-200-20 MG/5ML suspension (has no administration in time range)  lidocaine (XYLOCAINE) 2 % viscous mouth solution (has no administration in time range)    Initial Impression / Assessment and Plan / UC Course  I have reviewed the triage vital signs and the nursing notes.  Pertinent labs & imaging results that were available during my care of the patient were reviewed by me and considered in my medical decision making (see chart for details).     Initial EKG normal sinus rhythm, did have some nonspecific T wave abnormalities as well as questionable arrhythmia and rhythm strip of possible flutter.  Repeat EKG reassuring and shows normal sinus rhythm without acute signs of ischemia or infarction.  Patient an EKG was also evaluated by Dr. Lethea KillingsLamberty who agrees patient does not warrant emergent work-up at this time of cardiac etiology.  Patient was provided GI cocktail in clinic with some improvement of her symptoms.  Has reproducible chest pain on palpation, but is slightly different than the sharp pain she felt previously today.  Recommending to continue medicines for GERD as well as MSK cause.  Did recommend patient to follow-up outpatient with PCP as well as cardiology to have stress  test updated.  Advised to follow-up in emergency room if chest pain worsening, returning or not resolving with rest.Discussed strict return precautions. Patient verbalized understanding and is  agreeable with plan.  Final Clinical Impressions(s) / UC Diagnoses   Final diagnoses:  Atypical chest pain     Discharge Instructions     Continue Maalox or tums as needed for gas/reflux Naprosyn twice daily for the next week  Follow up with cardiologist and primary care If chest pain worsening follow up in emergency room   ED Prescriptions    Medication Sig Dispense Auth. Provider   naproxen (NAPROSYN) 500 MG tablet Take 1 tablet (500 mg total) by mouth 2 (two) times daily. With food 30 tablet Aleila Syverson, [place] C, PA-C     PDMP not reviewed this encounter.   Janith Lima, [place] 11/18/18 2138

## 2018-12-03 ENCOUNTER — Other Ambulatory Visit: Payer: Self-pay

## 2018-12-03 DIAGNOSIS — Z20822 Contact with and (suspected) exposure to covid-19: Secondary | ICD-10-CM

## 2018-12-05 ENCOUNTER — Other Ambulatory Visit: Payer: Medicare Other

## 2018-12-05 LAB — NOVEL CORONAVIRUS, NAA: SARS-CoV-2, NAA: DETECTED — AB

## 2018-12-06 ENCOUNTER — Telehealth: Payer: Self-pay | Admitting: Unknown Physician Specialty

## 2018-12-06 NOTE — Telephone Encounter (Signed)
Chills diarrhea, no taste or smell for 7 days.  Discussed with patient about Covid symptoms and the use of bamlanivimab, a monoclonal antibody infusion for those with mild to moderate Covid symptoms and at a high risk of hospitalization.  Pt is qualified for this infusion at the [facility] infusion center due to age >70 or  co-morbid conditions including HIV and hypertension which were addressed and are actively being managed by a [facility] provider.    After discussing the infusion's costs, potential benefits and side effects, the patient has decided to decline treatment with monoclonal antibodies.

## 2018-12-08 ENCOUNTER — Other Ambulatory Visit: Payer: Self-pay | Admitting: Internal Medicine

## 2018-12-08 DIAGNOSIS — B20 Human immunodeficiency virus [HIV] disease: Secondary | ICD-10-CM

## 2018-12-16 ENCOUNTER — Other Ambulatory Visit: Payer: Self-pay

## 2018-12-16 ENCOUNTER — Emergency Department (HOSPITAL_COMMUNITY): Admission: EM | Admit: 2018-12-16 | Discharge: 2018-12-16 | Discharge status: Against Medical Advice | Payer: 59

## 2018-12-16 ENCOUNTER — Encounter (HOSPITAL_COMMUNITY): Payer: Self-pay | Admitting: Emergency Medicine

## 2018-12-16 ENCOUNTER — Ambulatory Visit (HOSPITAL_COMMUNITY)
Admission: EM | Admit: 2018-12-16 | Discharge: 2018-12-16 | Disposition: A | Discharge status: Home / Self Care | Payer: 59 | Attending: Family Medicine | Admitting: Family Medicine

## 2018-12-16 DIAGNOSIS — J22 Unspecified acute lower respiratory infection: Secondary | ICD-10-CM | POA: Diagnosis not present

## 2018-12-16 DIAGNOSIS — U071 COVID-19: Secondary | ICD-10-CM | POA: Diagnosis not present

## 2018-12-16 DIAGNOSIS — B20 Human immunodeficiency virus [HIV] disease: Secondary | ICD-10-CM | POA: Diagnosis not present

## 2018-12-16 MED ORDER — ALBUTEROL SULFATE HFA 108 (90 BASE) MCG/ACT IN AERS
INHALATION_SPRAY | RESPIRATORY_TRACT | Status: AC
  Filled 2018-12-16: qty 6.7

## 2018-12-16 MED ORDER — ALBUTEROL SULFATE HFA 108 (90 BASE) MCG/ACT IN AERS: 2.0000 | INHALATION_SPRAY | Freq: Once | RESPIRATORY_TRACT | Status: DC

## 2018-12-16 MED ORDER — AEROCHAMBER PLUS FLO-VU LARGE MISC
Status: AC
  Filled 2018-12-16: qty 1

## 2018-12-16 NOTE — ED Notes (Signed)
Patient is being discharged from the [facility] and sent to the Emergency Department via wheelchair by staff. Per dr Meda Coffee, patient is stable but in need of higher level of care due to sob, hypoxia. Patient is aware and verbalizes understanding of plan of care.  Vitals:   12/16/18 1831 12/16/18 1923  BP: (!) 155/88   Pulse: 69 (!) 108  Resp: (!) 22 (!) 24  Temp: 99.2 F (37.3 C)   SpO2: 100% (!) 89%

## 2018-12-16 NOTE — ED Triage Notes (Signed)
Complains of cough, lightheaded and difficulty breathing  Patient tested positive 11/29/2018

## 2018-12-16 NOTE — ED Provider Notes (Signed)
[facility]    CSN: [id number] Arrival date & time: 12/16/18  1711      History   Chief Complaint Chief Complaint  Patient presents with  . Cough    HPI Miranda Kelley is a 63 y.o. female.   HPI  This is a 63 year old obese female with hypertension.  She has thyroid disease.  She also has HIV and is cared for by the infectious disease department.  She has chronic pain and takes Percocet 10 mg 4 times daily.  She has chronic anxiety and takes Xanax 1 mg 3 times daily. She states that she was exposed to Covid on 11/19/2018 by her granddaughter.  She remembers it was the day after Thanksgiving.  She started having symptoms and came in for testing on 12/03/2018.  She was found to be positive on that date. She was offered monoclonal antibody infusion and declined. She is here because she continues to have shortness of breath.  She is very fatigued.  She cannot walk up her stairs without feeling exhausted, and dizzy.  She has felt like she might collapse. She does not have underlying lung disease, asthma, or COPD. No underlying heart disease or diabetes. She is here with her 2 family members, daughter and granddaughter, who also tested positive for Covid.  Everyone else appears to be improving, but [name].   Past Medical History:  Diagnosis Date  . Anxiety   . Arthritis    knees  . Cataract    surgery - bilateral  . Chronic sinus infection   . HIV infection ([facility])   . Hypertension    hx - no longer a problem - weight loss surgery -lost 100lbs  . Thyroid disease     Patient Active Problem List   Diagnosis Date Noted  . Vaccine counseling 11/30/2016  . Osteoporosis 01/10/2016  . Medication monitoring encounter 01/10/2016  . Encounter for long-term (current) use of medications 04/23/2013  . Screening examination for venereal disease 04/23/2013  . Depressive episode 09/19/2011  . Symptomatic cholelithiasis 06/04/2011  . HTN (hypertension) 02/02/2011  . Human  immunodeficiency virus (HIV) disease ([place]) 12/01/2008  . PVC (premature ventricular contraction) 11/19/2008  . ELEVATED BLOOD PRESSURE WITHOUT DIAGNOSIS OF HYPERTENSION 09/16/2008  . DENTAL CARIES 07/01/2008  . OSTEOARTHRITIS, HIP, RIGHT 08/24/2007  . KNEE PAIN, RIGHT 06/21/2007  . FATIGUE 06/21/2007  . GERD 12/18/2006  . ANEMIA 10/08/2006  . Obesity 10/05/2006  . Pain in joint, ankle and foot 10/05/2006  . 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 Hypothyroidism 09/28/2006    Past Surgical History:  Procedure Laterality Date  . cataract surgery Bilateral   . CHOLECYSTECTOMY  06/05/2011   Procedure: LAPAROSCOPIC CHOLECYSTECTOMY WITH INTRAOPERATIVE CHOLANGIOGRAM;  Surgeon: Edward Jolly, MD;  Location: [facility];  Service: General;  Laterality: N/A;  . JOINT REPLACEMENT  06/2009   right knee  . LAPAROSCOPIC GASTRIC SLEEVE RESECTION  2015  . TOTAL HIP ARTHROPLASTY      OB History    Gravida  5   Para      Term      Preterm      AB      Living  4     SAB      TAB      Ectopic      Multiple      Live Births               Home Medications    Prior to Admission medications   Medication Sig  Start Date End Date Taking? Authorizing Provider  GENVOYA 150-150-200-10 MG TABS tablet TAKE 1 TABLET BY MOUTH EVERY DAY 12/09/18  Yes Comer, Belia Hemanobert W, MD  levothyroxine (SYNTHROID, LEVOTHROID) 150 MCG tablet Take 150 mcg by mouth daily.     Yes [provider]  ALPRAZolam Prudy Feeler(XANAX) 1 MG tablet Take 1 mg by mouth 3 (three) times daily. 11/07/18   [provider]  bisacodyl (DULCOLAX) 5 MG EC tablet Take 5 mg by mouth daily as needed for moderate constipation. Dulcolax 5 mg tab take as directed for colonoscopy prep.    [provider]  Cholecalciferol (VITAMIN D3) 2000 units TABS Take 2,000 Units by mouth daily.     [provider]  clobetasol ointment (TEMOVATE) 0.05 % Reported on 03/04/2015 02/22/12   [provider]  oxyCODONE-acetaminophen (PERCOCET)  10-325 MG tablet Take 1 tablet by mouth every 6 (six) hours as needed. 11/11/18   [provider]  traZODone (DESYREL) 150 MG tablet Take 150 mg by mouth daily. 10/24/18   [provider]    Family History Family History  Problem Relation Age of Onset  . Heart failure Mother   . Heart disease Mother   . Breast cancer Maternal Grandmother   . Colon cancer Neg Hx   . Colon polyps Neg Hx   . Esophageal cancer Neg Hx   . Rectal cancer Neg Hx   . Stomach cancer Neg Hx     Social History Social History   Tobacco Use  . Smoking status: Never Smoker  . Smokeless tobacco: Never Used  Substance Use Topics  . Alcohol use: Yes    Comment: occasionally  . Drug use: No     Allergies   Patient has no known allergies.   Review of Systems Review of Systems  Constitutional: Positive for activity change, appetite change, fatigue and unexpected weight change. Negative for chills and fever.       Patient has lost 15 pounds in 2 weeks  HENT: Negative for congestion and hearing loss.   Eyes: Negative for pain.  Respiratory: Positive for cough and shortness of breath.   Cardiovascular: Negative for chest pain and leg swelling.  Gastrointestinal: Negative for abdominal pain, constipation, diarrhea, nausea and vomiting.  Genitourinary: Negative for dysuria and frequency.  Musculoskeletal: Positive for myalgias.  Neurological: Positive for headaches. Negative for dizziness and seizures.  Psychiatric/Behavioral: The patient is not nervous/anxious.   Patient states that she is afraid to sleep, afraid she will not wake up   Physical Exam Triage Vital Signs ED Triage Vitals  Enc Vitals Group     BP 12/16/18 1831 (!) 155/88     Pulse Rate 12/16/18 1831 69     Resp 12/16/18 1831 (!) 22     Temp 12/16/18 1831 99.2 F (37.3 C)     Temp Source 12/16/18 1831 Oral     SpO2 12/16/18 1831 100 %     Weight 12/16/18 1830 263 lb (119.3 kg)     Height --      Head Circumference --       Peak Flow --      Pain Score 12/16/18 1827 7     Pain Loc --      Pain Edu? --      Excl. in GC? --    No data found.  Updated Vital Signs BP (!) 155/88 (BP Location: Left Arm)   Pulse (!) 108   Temp 99.2 F (37.3 C) (Oral)  Resp (!) 24   Wt 119.3 kg   LMP 04/03/2011   SpO2 (!) 89%   BMI 43.77 kg/m      Physical Exam Constitutional:      General: She is not in acute distress.    Appearance: She is well-developed. She is obese.     Comments: Morbidly obese.  Appears tired.  Appears mildly dyspneic at rest  HENT:     Head: Normocephalic and atraumatic.     Mouth/Throat:     Comments: Mask in place Eyes:     Conjunctiva/sclera: Conjunctivae normal.     Pupils: Pupils are equal, round, and reactive to light.  Cardiovascular:     Rate and Rhythm: Normal rate and regular rhythm.     Heart sounds: Normal heart sounds.  Pulmonary:     Effort: Pulmonary effort is normal. No respiratory distress.     Breath sounds: Rales present.     Comments: Bibasilar faint rales Abdominal:     General: There is no distension.     Palpations: Abdomen is soft.  Musculoskeletal:        General: Normal range of motion.     Cervical back: Normal range of motion and neck supple.  Lymphadenopathy:     Cervical: No cervical adenopathy.  Skin:    General: Skin is warm and dry.  Neurological:     General: No focal deficit present.     Mental Status: She is alert.  Psychiatric:        Mood and Affect: Mood normal.        Behavior: Behavior normal.     Comments: Mildly tearful      UC Treatments / Results  Labs (all labs ordered are listed, but only abnormal results are displayed) Labs Reviewed - No data to display  EKG   Radiology No results found.  Procedures Procedures (including critical care time)  Medications Ordered in UC Medications - No data to display  Initial Impression / Assessment and Plan / UC Course  I have reviewed the triage vital signs and the  nursing notes.  Pertinent labs & imaging results that were available during my care of the patient were reviewed by me and considered in my medical decision making (see chart for details).     With ambulation the patient's oxygen saturation drops from 100% to 89%.  Her pulse rises from 69 to 108 bpm. With patient's report of shortness of breath, dizziness, persistent cough, and poor exercise tolerance I believe she needs to be evaluated in the emergency department. Final Clinical Impressions(s) / UC Diagnoses   Final diagnoses:  Lower respiratory tract infection due to COVID-19 virus     Discharge Instructions     Your oxygen level drops with walking When you were dizzy your heart rate went up You need to go to the ER for evaluation    ED Prescriptions    None     PDMP not reviewed this encounter.   Eustace Moore, MD 12/16/18 [phone]

## 2018-12-16 NOTE — ED Notes (Signed)
Pt called x3 for triage. No reply.

## 2018-12-16 NOTE — Discharge Instructions (Addendum)
Your oxygen level drops with walking When you were dizzy your heart rate went up You need to go to the ER for evaluation

## 2018-12-23 ENCOUNTER — Encounter: Payer: Medicare Other | Admitting: Internal Medicine

## 2018-12-23 ENCOUNTER — Other Ambulatory Visit: Payer: 59

## 2019-01-07 ENCOUNTER — Telehealth: Payer: Self-pay

## 2019-01-07 ENCOUNTER — Ambulatory Visit
Admission: RE | Admit: 2019-01-07 | Discharge: 2019-01-07 | Disposition: A | Discharge status: Home / Self Care | Payer: 59 | Source: Ambulatory Visit | Attending: Family Medicine | Admitting: Family Medicine

## 2019-01-07 ENCOUNTER — Other Ambulatory Visit: Payer: Self-pay | Admitting: Family Medicine

## 2019-01-07 ENCOUNTER — Other Ambulatory Visit: Payer: Self-pay

## 2019-01-07 DIAGNOSIS — R079 Chest pain, unspecified: Secondary | ICD-10-CM

## 2019-01-07 DIAGNOSIS — R0602 Shortness of breath: Secondary | ICD-10-CM

## 2019-01-07 NOTE — Telephone Encounter (Signed)
COVID-19 Pre-Screening Questions:  Do you currently have a fever (>100 F), chills or unexplained body aches? NO   Are you currently experiencing new cough, shortness of breath, sore throat, runny nose?NO .  Have you recently travelled outside the state of [place] in the last 14 days? NO .  Have you been in contact with someone that is currently pending confirmation of Covid19 testing or has been confirmed to have the Covid19 virus?  NO  **If the patient answers NO to ALL questions -  advise the patient to please call the clinic before coming to the office should any symptoms develop.     

## 2019-01-08 ENCOUNTER — Ambulatory Visit (INDEPENDENT_AMBULATORY_CARE_PROVIDER_SITE_OTHER): Payer: 59 | Admitting: Internal Medicine

## 2019-01-08 ENCOUNTER — Encounter: Payer: Self-pay | Admitting: Internal Medicine

## 2019-01-08 VITALS — BP 120/81 | HR 78

## 2019-01-08 DIAGNOSIS — Z113 Encounter for screening for infections with a predominantly sexual mode of transmission: Secondary | ICD-10-CM

## 2019-01-08 DIAGNOSIS — Z79899 Other long term (current) drug therapy: Secondary | ICD-10-CM | POA: Diagnosis not present

## 2019-01-08 DIAGNOSIS — B20 Human immunodeficiency virus [HIV] disease: Secondary | ICD-10-CM

## 2019-01-09 LAB — T-HELPER CELL (CD4) - (RCID CLINIC ONLY)
CD4 % Helper T Cell: 42 % (ref 33–65)
CD4 T Cell Abs: 1054 /uL (ref 400–1790)

## 2019-01-10 ENCOUNTER — Encounter: Payer: Self-pay | Admitting: Internal Medicine

## 2019-01-10 NOTE — Assessment & Plan Note (Signed)
Screened negative 

## 2019-01-10 NOTE — Progress Notes (Signed)
   Subjective:    Patient ID: Miranda Kelley, female    DOB: 05/27/1955, 64 y.o.   MRN: [id number]  HPI Here for follow up of hIV She continues on Genvoya and no missed doses.  Had COVID in November and hospitalized at [facility].  Better now.  Continues to refuse all vaccines.  Labs reviewed with her and all good.     Review of Systems  Constitutional: Negative for fatigue.  Gastrointestinal: Negative for diarrhea.  Skin: Negative for rash.       Objective:   Physical Exam  Constitutional: She appears well-developed and well-nourished. No distress.  Eyes: No scleral icterus.  Cardiovascular: Normal rate, regular rhythm and normal heart sounds.  No murmur heard. Pulmonary/Chest: Effort normal and breath sounds normal. No respiratory distress.  Skin: No rash noted.   SH: no tobacco history       Assessment & Plan:

## 2019-01-10 NOTE — Assessment & Plan Note (Signed)
She is doing well with no indication to change anything.  She will continue with yearly follow up.

## 2019-01-10 NOTE — Assessment & Plan Note (Signed)
Some elevation of her LDL.  She will continue to follow with her PCP for this.

## 2019-01-14 LAB — COMPLETE METABOLIC PANEL WITH GFR
AG Ratio: 1.2 (calc) (ref 1.0–2.5)
ALT: 11 U/L (ref 6–29)
AST: 10 U/L (ref 10–35)
Albumin: 3.6 g/dL (ref 3.6–5.1)
Alkaline phosphatase (APISO): 84 U/L (ref 37–153)
BUN/Creatinine Ratio: 12 (calc) (ref 6–22)
BUN: 12 mg/dL (ref 7–25)
CO2: 25 mmol/L (ref 20–32)
Calcium: 9.8 mg/dL (ref 8.6–10.4)
Chloride: 107 mmol/L (ref 98–110)
Creat: 1 mg/dL — ABNORMAL HIGH (ref 0.50–0.99)
GFR, Est African American: 69 mL/min/{1.73_m2} (ref >=60)
GFR, Est Non African American: 60 mL/min/{1.73_m2} (ref >=60)
Globulin: 3 g/dL (calc) (ref 1.9–3.7)
Glucose, Bld: 96 mg/dL (ref 65–99)
Potassium: 3.8 mmol/L (ref 3.5–5.3)
Sodium: 141 mmol/L (ref 135–146)
Total Bilirubin: 0.3 mg/dL (ref 0.2–1.2)
Total Protein: 6.6 g/dL (ref 6.1–8.1)

## 2019-01-14 LAB — CBC WITH DIFFERENTIAL/PLATELET
Absolute Monocytes: 386 cells/uL (ref 200–950)
Basophils Absolute: 28 cells/uL (ref 0–200)
Basophils Relative: 0.4 %
Eosinophils Absolute: 173 cells/uL (ref 15–500)
Eosinophils Relative: 2.5 %
HCT: 34.5 % — ABNORMAL LOW (ref 35.0–45.0)
Hemoglobin: 11.1 g/dL — ABNORMAL LOW (ref 11.7–15.5)
Lymphs Abs: 2484 cells/uL (ref 850–3900)
MCH: 29.1 pg (ref 27.0–33.0)
MCHC: 32.2 g/dL (ref 32.0–36.0)
MCV: 90.3 fL (ref 80.0–100.0)
MPV: 10.7 fL (ref 7.5–12.5)
Monocytes Relative: 5.6 %
Neutro Abs: 3830 cells/uL (ref 1500–7800)
Neutrophils Relative %: 55.5 %
Platelets: 250 10*3/uL (ref 140–400)
RBC: 3.82 10*6/uL (ref 3.80–5.10)
RDW: 14.4 % (ref 11.0–15.0)
Total Lymphocyte: 36 %
WBC: 6.9 10*3/uL (ref 3.8–10.8)

## 2019-01-14 LAB — HIV-1 RNA QUANT-NO REFLEX-BLD
HIV 1 RNA Quant: <20 copies/mL
HIV-1 RNA Quant, Log: <1.3 Log copies/mL

## 2019-01-14 LAB — LIPID PANEL
Cholesterol: 220 mg/dL — ABNORMAL HIGH (ref <200)
HDL: 77 mg/dL (ref >=50)
LDL Cholesterol (Calc): 124 mg/dL (calc) — ABNORMAL HIGH
Non-HDL Cholesterol (Calc): 143 mg/dL (calc) — ABNORMAL HIGH (ref <130)
Total CHOL/HDL Ratio: 2.9 (calc) (ref <5.0)
Triglycerides: 93 mg/dL (ref <150)

## 2019-01-14 LAB — RPR: RPR Ser Ql: NONREACTIVE

## 2019-02-26 ENCOUNTER — Ambulatory Visit: Payer: 59 | Attending: Internal Medicine

## 2019-02-26 DIAGNOSIS — Z20822 Contact with and (suspected) exposure to covid-19: Secondary | ICD-10-CM

## 2019-02-27 LAB — NOVEL CORONAVIRUS, NAA: SARS-CoV-2, NAA: NOT DETECTED

## 2019-03-12 ENCOUNTER — Other Ambulatory Visit: Payer: Self-pay | Admitting: Internal Medicine

## 2019-03-12 DIAGNOSIS — B20 Human immunodeficiency virus [HIV] disease: Secondary | ICD-10-CM

## 2019-05-05 NOTE — Progress Notes (Signed)
CARDIOLOGY OFFICE NOTE  Date:  05/06/2019    Miranda Kelley Date of Birth: 08-09-1955 Medical Record #[id number]  PCP:  Leilani Able, MD  Cardiologist:  Katrinka Blazing   Chief Complaint  Patient presents with  . Follow-up    Seen for Dr. Katrinka Blazing    History of Present Illness: Miranda Kelley is a 64 y.o. female who presents today for a 15 month check. Seen for Dr. Katrinka Blazing.   She has a history of obesity, HIV (Dr. Luciana Axe), chronic dyspnea, family h/o CAD, and hypertension.  She has a significant family history heart failure and CAD.  She was last seen last February of 2020 and was felt to be ok - had gained weight with the pandemic, not exercising and her DOE was felt to be related to deconditioning and weight related.   The patient does not have symptoms concerning for COVID-19 infection (fever, chills, cough, or new shortness of breath).   Comes in today. Here alone. She had COVID after Thanksgiving. She was in the hospital for 4 days. Recovered. She has had both vaccines. FH very + for CAD. She has no exertional chest pain. She notes an intermittent soreness in the upper left chest with touching/rubbing.  She is not able to exercise due to an ankle injury. She has gained more weight. [facility]. Breathing is ok. She has had a recent cold. Overall, she feels like she is doing well with no cardiac complaint.   Past Medical History:  Diagnosis Date  . Anxiety   . Arthritis    knees  . Cataract    surgery - bilateral  . Chronic sinus infection   . HIV infection (HCC)   . Hypertension    hx - no longer a problem - weight loss surgery -lost 100lbs  . Thyroid disease     Past Surgical History:  Procedure Laterality Date  . cataract surgery Bilateral   . CHOLECYSTECTOMY  06/05/2011   Procedure: LAPAROSCOPIC CHOLECYSTECTOMY WITH INTRAOPERATIVE CHOLANGIOGRAM;  Surgeon: Mariella Saa, MD;  Location: [facility];  Service: General;   Laterality: N/A;  . JOINT REPLACEMENT  06/2009   right knee  . LAPAROSCOPIC GASTRIC SLEEVE RESECTION  2015  . TOTAL HIP ARTHROPLASTY       Medications: Current Meds  Medication Sig  . ALPRAZolam (XANAX) 1 MG tablet Take 1 mg by mouth 3 (three) times daily.  . bisacodyl (DULCOLAX) 5 MG EC tablet Take 5 mg by mouth daily as needed for moderate constipation. Dulcolax 5 mg tab take as directed for colonoscopy prep.  . Cholecalciferol (VITAMIN D3) 2000 units TABS Take 2,000 Units by mouth daily.   . clobetasol ointment (TEMOVATE) 0.05 % Reported on 03/04/2015  . cyclobenzaprine (FLEXERIL) 10 MG tablet Take 10 mg by mouth 3 (three) times daily.  . diclofenac Sodium (VOLTAREN) 1 % GEL   . GENVOYA 150-150-200-10 MG TABS tablet TAKE 1 TABLET BY MOUTH EVERY DAY  . levothyroxine (SYNTHROID, LEVOTHROID) 150 MCG tablet Take 150 mcg by mouth daily.    . metoCLOPramide (REGLAN) 10 MG tablet Take 10 mg by mouth 4 (four) times daily.  [name] nystatin ointment (MYCOSTATIN) APPLY TO AFFECTED AREAS 2 TIMES A DAY  . oxyCODONE-acetaminophen (PERCOCET) 10-325 MG tablet Take 1 tablet by mouth every 6 (six) hours as needed.  . traZODone (DESYREL) 150 MG tablet Take 150 mg by mouth daily.     Allergies: No Known Allergies  Social History: The patient  reports that she has never smoked. She has never used smokeless tobacco. She reports current alcohol use. She reports that she does not use drugs.   Family History: The patient's family history includes Breast cancer in her maternal grandmother; Heart disease in her mother; Heart failure in her mother.   Review of Systems: Please see the history of present illness.   All other systems are reviewed and negative.   Physical Exam: VS:  BP 136/82   Pulse 74   Ht 5\' 6"  (1.676 m)   Wt 277 lb (125.6 kg)   LMP 04/03/2011   SpO2 97%   BMI 44.71 kg/m  .  BMI Body mass index is 44.71 kg/m.  Wt Readings from Last 3 Encounters:  05/06/19 277 lb (125.6 kg)  12/16/18  263 lb (119.3 kg)  02/28/18 273 lb 12.8 oz (124.2 kg)    General: Pleasant. Alert and in no acute distress. She is obese.   Cardiac: Regular rate and rhythm. No murmurs, rubs, or gallops. No edema.  Respiratory:  Lungs are clear to auscultation bilaterally with normal work of breathing.  GI: Soft and nontender.  MS: No deformity or atrophy. Gait and ROM intact.  Skin: Warm and dry. Color is normal.  Neuro:  Strength and sensation are intact and no gross focal deficits noted.  Psych: Alert, appropriate and with normal affect.   LABORATORY DATA:  EKG:  EKG is not ordered today. EKG from last November with sinus bradycardia and non specific changes noted.   Lab Results  Component Value Date   WBC 6.9 01/08/2019   HGB 11.1 (L) 01/08/2019   HCT 34.5 (L) 01/08/2019   PLT 250 01/08/2019   GLUCOSE 96 01/08/2019   CHOL 220 (H) 01/08/2019   TRIG 93 01/08/2019   HDL 77 01/08/2019   LDLCALC 124 (H) 01/08/2019   ALT 11 01/08/2019   AST 10 01/08/2019   NA 141 01/08/2019   K 3.8 01/08/2019   CL 107 01/08/2019   CREATININE 1.00 (H) 01/08/2019   BUN 12 01/08/2019   CO2 25 01/08/2019   TSH 3.621 05/19/2009   INR 1.14 05/31/2010   MICROALBUR TNP mg/dL 11/19/2008     BNP (last 3 results) No results for input(s): BNP in the last 8760 hours.  ProBNP (last 3 results) No results for input(s): PROBNP in the last 8760 hours.   Other Studies Reviewed Today:   Assessment/Plan:  1. HTN - actually on no medicines - BP is fine.   2. DOE - does not seem bothersome to her - felt to be from deconditioning and weight. She is planning on starting exercise program today.   3. Obesity - see #2. Encouraged her to follow thru with [facility] - she is planning on the water program.   4. FH + for CAD - needs aggressive CV risk factor modification with weight loss/diet/exercise. She has a plan in place to start exercise as of today. Lipids are fair - she is currently on no  medicines. She has no worrisome symptoms.   5. HIV - followed by Dr. Linus Salmons  6. COVID-19 Education: The signs and symptoms of COVID-19 were discussed with the patient and how to seek care for testing (follow up with PCP or arrange E-visit).  The importance of social distancing, staying at home, hand hygiene and wearing a mask when out in public were discussed today.  Current medicines are reviewed with the  patient today.  The patient does not have concerns regarding medicines other than what has been noted above.  The following changes have been made:  See above.  Labs/ tests ordered today include:   No orders of the defined types were placed in this encounter.    Disposition:   FU with [place] in one year.     Patient is agreeable to this plan and will call if any problems develop in the interim.   SignedNorma Fredrickson, NP  05/06/2019 11:16 AM  [facility] [address] [place], [place]  [address] Phone: [phone] Fax: [phone]

## 2019-05-06 ENCOUNTER — Other Ambulatory Visit: Payer: Self-pay

## 2019-05-06 ENCOUNTER — Encounter: Payer: Self-pay | Admitting: Nurse Practitioner

## 2019-05-06 ENCOUNTER — Ambulatory Visit (INDEPENDENT_AMBULATORY_CARE_PROVIDER_SITE_OTHER): Payer: 59 | Admitting: Nurse Practitioner

## 2019-05-06 VITALS — BP 136/82 | HR 74 | Ht 66.0 in | Wt 277.0 lb

## 2019-05-06 DIAGNOSIS — B20 Human immunodeficiency virus [HIV] disease: Secondary | ICD-10-CM

## 2019-05-06 DIAGNOSIS — Z7189 Other specified counseling: Secondary | ICD-10-CM

## 2019-05-06 DIAGNOSIS — U071 COVID-19: Secondary | ICD-10-CM

## 2019-05-06 DIAGNOSIS — I10 Essential (primary) hypertension: Secondary | ICD-10-CM

## 2019-05-06 NOTE — Patient Instructions (Addendum)
After Visit Summary:  We will be checking the following labs today - NONE   Medication Instructions:    Continue with your current medicines.    If you need a refill on your cardiac medications before your next appointment, please call your pharmacy.     Testing/Procedures To Be Arranged:  N/A  Follow-Up:   See Dr. Katrinka Blazing in about a year -  You will receive a reminder letter in the mail two months in advance. If you don't receive a letter, please call our office to schedule the follow-up appointment.   At [facility], you and your health needs are our priority.  As part of our continuing mission to provide you with exceptional heart care, we have created designated Provider Care Teams.  These Care Teams include your primary Cardiologist (physician) and Advanced Practice Providers (APPs -  Physician Assistants and Nurse Practitioners) who all work together to provide you with the care you need, when you need it.  Special Instructions:  . Stay safe, stay home, wash your hands for at least 20 seconds and wear a mask when out in public.  . It was good to talk with you today.  . Check with Dr. Pecola Leisure about your thyroid level.  [facility] Dubin [facility]   Call the [facility] office at [phone] if you have any questions, problems or concerns.

## 2019-07-21 ENCOUNTER — Other Ambulatory Visit: Payer: Self-pay | Admitting: Internal Medicine

## 2019-07-21 DIAGNOSIS — B20 Human immunodeficiency virus [HIV] disease: Secondary | ICD-10-CM

## 2019-08-25 ENCOUNTER — Other Ambulatory Visit: Payer: Self-pay | Admitting: Family Medicine

## 2019-08-25 DIAGNOSIS — Z1231 Encounter for screening mammogram for malignant neoplasm of breast: Secondary | ICD-10-CM

## 2019-08-26 ENCOUNTER — Ambulatory Visit: Payer: 59

## 2019-10-13 ENCOUNTER — Other Ambulatory Visit: Payer: Self-pay

## 2019-10-13 ENCOUNTER — Ambulatory Visit
Admission: RE | Admit: 2019-10-13 | Discharge: 2019-10-13 | Disposition: A | Discharge status: Home / Self Care | Payer: 59 | Source: Ambulatory Visit | Attending: Family Medicine | Admitting: Family Medicine

## 2019-10-13 DIAGNOSIS — Z1231 Encounter for screening mammogram for malignant neoplasm of breast: Secondary | ICD-10-CM

## 2020-01-21 ENCOUNTER — Other Ambulatory Visit: Payer: Self-pay | Admitting: Internal Medicine

## 2020-01-21 DIAGNOSIS — B20 Human immunodeficiency virus [HIV] disease: Secondary | ICD-10-CM

## 2020-02-21 ENCOUNTER — Other Ambulatory Visit: Payer: Self-pay | Admitting: Internal Medicine

## 2020-02-21 DIAGNOSIS — B20 Human immunodeficiency virus [HIV] disease: Secondary | ICD-10-CM

## 2020-02-23 ENCOUNTER — Telehealth: Payer: Self-pay

## 2020-02-23 NOTE — Telephone Encounter (Signed)
Reached out to patient due to overdue follow up visit.  Patient accepts labs and office appointments. Medication refill approved until seen by provider.  Miranda Kelley

## 2020-02-25 ENCOUNTER — Other Ambulatory Visit: Payer: 59

## 2020-03-10 ENCOUNTER — Other Ambulatory Visit: Payer: Self-pay

## 2020-03-10 ENCOUNTER — Other Ambulatory Visit: Payer: 59

## 2020-03-10 DIAGNOSIS — B20 Human immunodeficiency virus [HIV] disease: Secondary | ICD-10-CM

## 2020-03-10 DIAGNOSIS — Z113 Encounter for screening for infections with a predominantly sexual mode of transmission: Secondary | ICD-10-CM

## 2020-03-10 DIAGNOSIS — Z79899 Other long term (current) drug therapy: Secondary | ICD-10-CM

## 2020-03-11 LAB — T-HELPER CELL (CD4) - (RCID CLINIC ONLY)
CD4 % Helper T Cell: 45 % (ref 33–65)
CD4 T Cell Abs: 1443 /uL (ref 400–1790)

## 2020-03-12 ENCOUNTER — Other Ambulatory Visit: Payer: Self-pay | Admitting: Internal Medicine

## 2020-03-12 DIAGNOSIS — B20 Human immunodeficiency virus [HIV] disease: Secondary | ICD-10-CM

## 2020-03-12 LAB — CBC WITH DIFFERENTIAL/PLATELET
Absolute Monocytes: 468 cells/uL (ref 200–950)
Basophils Absolute: 39 cells/uL (ref 0–200)
Basophils Relative: 0.6 %
Eosinophils Absolute: 228 cells/uL (ref 15–500)
Eosinophils Relative: 3.5 %
HCT: 36.7 % (ref 35.0–45.0)
Hemoglobin: 11.8 g/dL (ref 11.7–15.5)
Lymphs Abs: 3562 cells/uL (ref 850–3900)
MCH: 27.9 pg (ref 27.0–33.0)
MCHC: 32.2 g/dL (ref 32.0–36.0)
MCV: 86.8 fL (ref 80.0–100.0)
MPV: 10.2 fL (ref 7.5–12.5)
Monocytes Relative: 7.2 %
Neutro Abs: 2204 cells/uL (ref 1500–7800)
Neutrophils Relative %: 33.9 %
Platelets: 329 10*3/uL (ref 140–400)
RBC: 4.23 10*6/uL (ref 3.80–5.10)
RDW: 12.8 % (ref 11.0–15.0)
Total Lymphocyte: 54.8 %
WBC: 6.5 10*3/uL (ref 3.8–10.8)

## 2020-03-12 LAB — LIPID PANEL
Cholesterol: 200 mg/dL — ABNORMAL HIGH (ref <200)
HDL: 57 mg/dL (ref >=50)
LDL Cholesterol (Calc): 121 mg/dL (calc) — ABNORMAL HIGH
Non-HDL Cholesterol (Calc): 143 mg/dL (calc) — ABNORMAL HIGH (ref <130)
Total CHOL/HDL Ratio: 3.5 (calc) (ref <5.0)
Triglycerides: 117 mg/dL (ref <150)

## 2020-03-12 LAB — COMPLETE METABOLIC PANEL WITH GFR
AG Ratio: 1.1 (calc) (ref 1.0–2.5)
ALT: 12 U/L (ref 6–29)
AST: 13 U/L (ref 10–35)
Albumin: 4 g/dL (ref 3.6–5.1)
Alkaline phosphatase (APISO): 105 U/L (ref 37–153)
BUN: 15 mg/dL (ref 7–25)
CO2: 27 mmol/L (ref 20–32)
Calcium: 10.4 mg/dL (ref 8.6–10.4)
Chloride: 103 mmol/L (ref 98–110)
Creat: 0.96 mg/dL (ref 0.50–0.99)
GFR, Est African American: 72 mL/min/{1.73_m2} (ref >=60)
GFR, Est Non African American: 62 mL/min/{1.73_m2} (ref >=60)
Globulin: 3.7 g/dL (calc) (ref 1.9–3.7)
Glucose, Bld: 80 mg/dL (ref 65–99)
Potassium: 4.3 mmol/L (ref 3.5–5.3)
Sodium: 140 mmol/L (ref 135–146)
Total Bilirubin: 0.4 mg/dL (ref 0.2–1.2)
Total Protein: 7.7 g/dL (ref 6.1–8.1)

## 2020-03-12 LAB — RPR: RPR Ser Ql: NONREACTIVE

## 2020-03-12 LAB — HIV-1 RNA QUANT-NO REFLEX-BLD
HIV 1 RNA Quant: NOT DETECTED Copies/mL
HIV-1 RNA Quant, Log: NOT DETECTED Log cps/mL

## 2020-03-24 ENCOUNTER — Encounter: Payer: Self-pay | Admitting: Internal Medicine

## 2020-03-24 ENCOUNTER — Other Ambulatory Visit: Payer: Self-pay

## 2020-03-24 ENCOUNTER — Ambulatory Visit (INDEPENDENT_AMBULATORY_CARE_PROVIDER_SITE_OTHER): Payer: 59 | Admitting: Internal Medicine

## 2020-03-24 VITALS — BP 116/81 | HR 88

## 2020-03-24 DIAGNOSIS — Z79899 Other long term (current) drug therapy: Secondary | ICD-10-CM | POA: Diagnosis not present

## 2020-03-24 DIAGNOSIS — B20 Human immunodeficiency virus [HIV] disease: Secondary | ICD-10-CM | POA: Diagnosis not present

## 2020-03-24 DIAGNOSIS — Z5181 Encounter for therapeutic drug level monitoring: Secondary | ICD-10-CM | POA: Diagnosis not present

## 2020-03-24 DIAGNOSIS — Z113 Encounter for screening for infections with a predominantly sexual mode of transmission: Secondary | ICD-10-CM | POA: Diagnosis not present

## 2020-03-25 ENCOUNTER — Encounter: Payer: Self-pay | Admitting: Internal Medicine

## 2020-03-25 NOTE — Assessment & Plan Note (Signed)
Creat and LFTs wnl.  No concerns.

## 2020-03-25 NOTE — Assessment & Plan Note (Signed)
Doing well and no concerns on labs or from the patient.  She will continue with Genvoya and rtc in 1 year.

## 2020-03-25 NOTE — Assessment & Plan Note (Signed)
Lipid panel noted and monitored and managed by her PCP

## 2020-03-25 NOTE — Progress Notes (Signed)
   Subjective:    Patient ID: Miranda Kelley, female    DOB: [birth year], 65 y.o.   MRN: [id number]  HPI Here for follow up of HIV She continues on Genvoya and her CD4 is 1443 and viral load < 20.  Creat, LFTs wnl.  She has had no issues obtaining or tolerating the Genvoya.  No new complaints.     Review of Systems  Constitutional: Negative for fatigue.  Gastrointestinal: Negative for diarrhea and nausea.  Skin: Negative for rash.       Objective:   Physical Exam Eyes:     General: No scleral icterus. Cardiovascular:     Rate and Rhythm: Normal rate and regular rhythm.     Heart sounds: No murmur heard.   Pulmonary:     Effort: Pulmonary effort is normal.  Neurological:     Mental Status: She is alert.  Psychiatric:        Mood and Affect: Mood normal.   SH: no tobacco        Assessment & Plan:

## 2020-03-25 NOTE — Assessment & Plan Note (Signed)
Screened negative 

## 2020-04-04 ENCOUNTER — Other Ambulatory Visit: Payer: Self-pay | Admitting: Internal Medicine

## 2020-04-04 DIAGNOSIS — B20 Human immunodeficiency virus [HIV] disease: Secondary | ICD-10-CM

## 2020-09-08 ENCOUNTER — Encounter (HOSPITAL_COMMUNITY): Payer: Self-pay

## 2020-09-08 ENCOUNTER — Other Ambulatory Visit: Payer: Self-pay

## 2020-09-08 ENCOUNTER — Emergency Department (HOSPITAL_COMMUNITY): Payer: 59

## 2020-09-08 ENCOUNTER — Emergency Department (HOSPITAL_COMMUNITY)
Admission: EM | Admit: 2020-09-08 | Discharge: 2020-09-08 | Disposition: A | Discharge status: Home / Self Care | Payer: 59 | Attending: Emergency Medicine | Admitting: Emergency Medicine

## 2020-09-08 DIAGNOSIS — M25562 Pain in left knee: Secondary | ICD-10-CM | POA: Diagnosis not present

## 2020-09-08 DIAGNOSIS — M25572 Pain in left ankle and joints of left foot: Secondary | ICD-10-CM | POA: Diagnosis not present

## 2020-09-08 DIAGNOSIS — Z5321 Procedure and treatment not carried out due to patient leaving prior to being seen by health care provider: Secondary | ICD-10-CM | POA: Diagnosis not present

## 2020-09-08 DIAGNOSIS — W010XXA Fall on same level from slipping, tripping and stumbling without subsequent striking against object, initial encounter: Secondary | ICD-10-CM | POA: Insufficient documentation

## 2020-09-08 NOTE — ED Notes (Signed)
Pt seen driving away from ED.

## 2020-09-08 NOTE — ED Triage Notes (Signed)
Pt reports left knee and ankle pain after slipping on the steps and falling. Pt reports prior surgery on left ankle.

## 2020-10-22 ENCOUNTER — Other Ambulatory Visit: Payer: Self-pay | Admitting: Family Medicine

## 2020-10-22 DIAGNOSIS — Z1231 Encounter for screening mammogram for malignant neoplasm of breast: Secondary | ICD-10-CM

## 2020-10-29 ENCOUNTER — Other Ambulatory Visit: Payer: Self-pay | Admitting: Internal Medicine

## 2020-10-29 DIAGNOSIS — B20 Human immunodeficiency virus [HIV] disease: Secondary | ICD-10-CM

## 2020-11-18 ENCOUNTER — Other Ambulatory Visit: Payer: Self-pay | Admitting: Family Medicine

## 2020-11-18 DIAGNOSIS — E2839 Other primary ovarian failure: Secondary | ICD-10-CM

## 2020-11-23 ENCOUNTER — Other Ambulatory Visit: Payer: Self-pay

## 2020-11-23 ENCOUNTER — Ambulatory Visit
Admission: RE | Admit: 2020-11-23 | Discharge: 2020-11-23 | Disposition: A | Discharge status: Home / Self Care | Payer: 59 | Source: Ambulatory Visit | Attending: Family Medicine | Admitting: Family Medicine

## 2020-11-23 DIAGNOSIS — Z1231 Encounter for screening mammogram for malignant neoplasm of breast: Secondary | ICD-10-CM

## 2021-03-02 ENCOUNTER — Other Ambulatory Visit: Payer: 59

## 2021-03-02 ENCOUNTER — Other Ambulatory Visit: Payer: Self-pay

## 2021-03-02 DIAGNOSIS — B20 Human immunodeficiency virus [HIV] disease: Secondary | ICD-10-CM

## 2021-03-02 DIAGNOSIS — Z113 Encounter for screening for infections with a predominantly sexual mode of transmission: Secondary | ICD-10-CM

## 2021-03-07 NOTE — Progress Notes (Signed)
Cardiology Office Note:    Date:  03/08/2021   ID:  Miranda Kelley, DOB 1955/06/14, MRN [id number]  PCP:  Leilani Able, MD  Cardiologist:  None   Referring MD: Leilani Able, MD   Chief Complaint  Patient presents with   Follow-up    Family history CAD Hyperlipidemia Obesity    History of Present Illness:    Miranda Kelley is a 66 y.o. female with a hx of obesity, dyspnea, family h/o CAD, and hypertension.  She has a significant family history heart failure and CAD.   She is worried that she will develop a coronary or heart failure problem because of her family history.  Her siblings have each had stents recently.  Her mother has congestive heart failure.  Her father has CAD.  She is not diabetic.  She does not smoke or drink.  She has dyspnea on exertion with stairs.  Otherwise unremarkable.  Past Medical History:  Diagnosis Date   Anxiety    Arthritis    knees   Cataract    surgery - bilateral   Chronic sinus infection    HIV infection (HCC)    Hypertension    hx - no longer a problem - weight loss surgery -lost 100lbs   Thyroid disease     Past Surgical History:  Procedure Laterality Date   cataract surgery Bilateral    CHOLECYSTECTOMY  06/05/2011   Procedure: LAPAROSCOPIC CHOLECYSTECTOMY WITH INTRAOPERATIVE CHOLANGIOGRAM;  Surgeon: Mariella Saa, MD;  Location: [facility];  Service: General;  Laterality: N/A;   JOINT REPLACEMENT  06/2009   right knee   LAPAROSCOPIC GASTRIC SLEEVE RESECTION  2015   TOTAL HIP ARTHROPLASTY      Current Medications: Current Meds  Medication Sig   ALPRAZolam (XANAX) 1 MG tablet Take 1 mg by mouth 3 (three) times daily.   bisacodyl (DULCOLAX) 5 MG EC tablet Take 5 mg by mouth daily as needed for moderate constipation. Dulcolax 5 mg tab take as directed for colonoscopy prep.   Cholecalciferol (VITAMIN D3) 2000 units TABS Take 2,000 Units by mouth daily.    clobetasol ointment (TEMOVATE) 0.05 % Reported on 03/04/2015   cyclobenzaprine  (FLEXERIL) 10 MG tablet Take 10 mg by mouth 3 (three) times daily.   diclofenac Sodium (VOLTAREN) 1 % GEL    GENVOYA 150-150-200-10 MG TABS tablet TAKE 1 TABLET BY MOUTH EVERY DAY   levothyroxine (SYNTHROID, LEVOTHROID) 150 MCG tablet Take 150 mcg by mouth daily.   metoCLOPramide (REGLAN) 10 MG tablet Take 10 mg by mouth 4 (four) times daily.   nystatin ointment (MYCOSTATIN) APPLY TO AFFECTED AREAS 2 TIMES A DAY   oxyCODONE-acetaminophen (PERCOCET) 10-325 MG tablet Take 1 tablet by mouth every 6 (six) hours as needed.     Allergies:   Patient has no known allergies.   Social History   Socioeconomic History   Marital status: Widowed    Spouse name: Not on file   Number of children: Not on file   Years of education: Not on file   Highest education level: Not on file  Occupational History   Not on file  Tobacco Use   Smoking status: Never   Smokeless tobacco: Never  Vaping Use   Vaping Use: Never used  Substance and Sexual Activity   Alcohol use: Yes    Comment: occasionally   Drug use: No   Sexual activity: Not Currently    Comment: pt. given condoms  Other Topics Concern   Not on file  Social History Narrative   Not on file   Social Determinants of Health   Financial Resource Strain: Not on file  Food Insecurity: Not on file  Transportation Needs: Not on file  Physical Activity: Not on file  Stress: Not on file  Social Connections: Not on file     Family History: The patient's family history includes Breast cancer in her maternal grandmother; Heart disease in her mother; Heart failure in her mother. There is no history of Colon cancer, Colon polyps, Esophageal cancer, Rectal cancer, or Stomach cancer.  ROS:   Please see the history of present illness.    Significant family history of CAD.  No chest pain.  All other systems reviewed and are negative.  EKGs/Labs/Other Studies Reviewed:    Studies reviewed for today's visit: Abdominal and pelvic CT performed in  2013 did not demonstrate calcification and vascular structures.  EKG:  EKG normal sinus rhythm, nonspecific T wave flattening, prominent voltage.  When compared to the prior tracing from November 2020, no significant changes noted.  Recent Labs: 03/10/2020: ALT 12; BUN 15; Creat 0.96; Hemoglobin 11.8; Platelets 329; Potassium 4.3; Sodium 140  Recent Lipid Panel    Component Value Date/Time   CHOL 200 (H) 03/10/2020 1135   TRIG 117 03/10/2020 1135   HDL 57 03/10/2020 1135   CHOLHDL 3.5 03/10/2020 1135   VLDL 21 01/06/2016 1451   LDLCALC 121 (H) 03/10/2020 1135    Physical Exam:    VS:  BP 112/90    Pulse 69    Ht 5\' 6"  (1.676 m)    Wt 269 lb 3.2 oz (122.1 kg)    LMP 04/03/2011    SpO2 98%    BMI 43.45 kg/m     Wt Readings from Last 3 Encounters:  03/08/21 269 lb 3.2 oz (122.1 kg)  05/06/19 277 lb (125.6 kg)  12/16/18 263 lb (119.3 kg)     GEN: Morbid obesity. No acute distress HEENT: Normal NECK: No JVD. LYMPHATICS: No lymphadenopathy CARDIAC: No murmur. RRR no gallop, or edema. VASCULAR:  Normal Pulses. No bruits. RESPIRATORY:  Clear to auscultation without rales, wheezing or rhonchi  ABDOMEN: Soft, non-tender, non-distended, No pulsatile mass, MUSCULOSKELETAL: No deformity  SKIN: Warm and dry NEUROLOGIC:  Alert and oriented x 3 PSYCHIATRIC:  Normal affect   ASSESSMENT:    1. Essential hypertension   2. Morbid obesity (HCC)   3. Family history of early CAD   4. Hyperlipidemia LDL goal <70   5. Human immunodeficiency virus (HIV) disease (HCC)    PLAN:    In order of problems listed above:  Target blood pressure 140/90 with ideal 130/80.  Mildly elevated today.  Discussed restriction in diet.  She has chronic pain syndrome and has blood pressure checked every month.  States that at the pain clinic her blood pressures usually run less than 120/90 mmHg.  We will continue to observe. Diet control, physical activity, and continued surveillance. All first-line relatives  have had a vascular event.  Her father died of coronary disease.  We will get a coronary calcium score they will give 12/18/18 guidance concerning aggressiveness of risk factor modification including lipids. LDL should likely be less than 70.  Calcium score will give US guidance. This is a risk factor for CAD.   Overall education and awareness concerning primary risk prevention was discussed in detail: LDL less than 70, hemoglobin A1c less than 7, blood pressure target less than 130/80 mmHg, >150 minutes of moderate aerobic activity  per week, avoidance of smoking, weight control (via diet and exercise), and continued surveillance/management of/for obstructive sleep apnea.  If calcium score elevated, she will need to 81 mg of aspirin per day and statin therapy.  Medication Adjustments/Labs and Tests Ordered: Current medicines are reviewed at length with the patient today.  Concerns regarding medicines are outlined above.  Orders Placed This Encounter  Procedures   CT CARDIAC SCORING (SELF PAY ONLY)   Basic metabolic panel   Hemoglobin A1c   EKG 12-Lead   No orders of the defined types were placed in this encounter.   Patient Instructions  Medication Instructions:  Your physician recommends that you continue on your current medications as directed. Please refer to the Current Medication list given to you today.  *If you need a refill on your cardiac medications before your next appointment, please call your pharmacy*   Lab Work: BMET, A1C If you have labs (blood work) drawn today and your tests are completely normal, you will receive your results only by: MyChart Message (if you have MyChart) OR A paper copy in the mail If you have any lab test that is abnormal or we need to change your treatment, we will call you to review the results.   Testing/Procedures: Your provider has recommended a calcium score be completed. This procedure is 99$ out of pocket due at time of visit which is  located at this office.    Follow-Up: At [facility], you and your health needs are our priority.  As part of our continuing mission to provide you with exceptional heart care, we have created designated Provider Care Teams.  These Care Teams include your primary Cardiologist (physician) and Advanced Practice Providers (APPs -  Physician Assistants and Nurse Practitioners) who all work together to provide you with the care you need, when you need it.  We recommend signing up for the patient portal called "MyChart".  Sign up information is provided on this After Visit Summary.  MyChart is used to connect with patients for Virtual Visits (Telemedicine).  Patients are able to view lab/test results, encounter notes, upcoming appointments, etc.  Non-urgent messages can be sent to your provider as well.   To learn more about what you can do with MyChart, go to [email or web address].    Your next appointment:   1 year(s)  The format for your next appointment:   In Person  Provider:   Darci Needle III MD         Signed, Lesleigh Noe, MD  03/08/2021 9:01 AM    [facility]

## 2021-03-08 ENCOUNTER — Ambulatory Visit (INDEPENDENT_AMBULATORY_CARE_PROVIDER_SITE_OTHER): Payer: 59 | Admitting: Interventional Cardiology

## 2021-03-08 ENCOUNTER — Other Ambulatory Visit: Payer: Self-pay

## 2021-03-08 ENCOUNTER — Encounter: Payer: Self-pay | Admitting: Interventional Cardiology

## 2021-03-08 VITALS — BP 112/90 | HR 69 | Ht 66.0 in | Wt 269.2 lb

## 2021-03-08 DIAGNOSIS — I10 Essential (primary) hypertension: Secondary | ICD-10-CM

## 2021-03-08 DIAGNOSIS — B20 Human immunodeficiency virus [HIV] disease: Secondary | ICD-10-CM | POA: Diagnosis not present

## 2021-03-08 DIAGNOSIS — E785 Hyperlipidemia, unspecified: Secondary | ICD-10-CM | POA: Diagnosis not present

## 2021-03-08 DIAGNOSIS — Z8249 Family history of ischemic heart disease and other diseases of the circulatory system: Secondary | ICD-10-CM

## 2021-03-08 LAB — BASIC METABOLIC PANEL
BUN/Creatinine Ratio: 13 (ref 12–28)
BUN: 14 mg/dL (ref 8–27)
CO2: 25 mmol/L (ref 20–29)
Calcium: 10.8 mg/dL — ABNORMAL HIGH (ref 8.7–10.3)
Chloride: 101 mmol/L (ref 96–106)
Creatinine, Ser: 1.05 mg/dL — ABNORMAL HIGH (ref 0.57–1.00)
Glucose: 83 mg/dL (ref 70–99)
Potassium: 4.3 mmol/L (ref 3.5–5.2)
Sodium: 139 mmol/L (ref 134–144)
eGFR: 59 mL/min/{1.73_m2} — ABNORMAL LOW (ref >59)

## 2021-03-08 LAB — HEMOGLOBIN A1C
Est. average glucose Bld gHb Est-mCnc: 111 mg/dL
Hgb A1c MFr Bld: 5.5 % (ref 4.8–5.6)

## 2021-03-08 NOTE — Patient Instructions (Signed)
Medication Instructions:  ?Your physician recommends that you continue on your current medications as directed. Please refer to the Current Medication list given to you today. ? ?*If you need a refill on your cardiac medications before your next appointment, please call your pharmacy* ? ? ?Lab Work: ?BMET, A1C ?If you have labs (blood work) drawn today and your tests are completely normal, you will receive your results only by: ?MyChart Message (if you have MyChart) OR ?A paper copy in the mail ?If you have any lab test that is abnormal or we need to change your treatment, we will call you to review the results. ? ? ?Testing/Procedures: ?Your provider has recommended a calcium score be completed. This procedure is 99$ out of pocket due at time of visit which is located at this office.  ? ? ?Follow-Up: ?At [facility], you and your health needs are our priority.  As part of our continuing mission to provide you with exceptional heart care, we have created designated Provider Care Teams.  These Care Teams include your primary Cardiologist (physician) and Advanced Practice Providers (APPs -  Physician Assistants and Nurse Practitioners) who all work together to provide you with the care you need, when you need it. ? ?We recommend signing up for the patient portal called "MyChart".  Sign up information is provided on this After Visit Summary.  MyChart is used to connect with patients for Virtual Visits (Telemedicine).  Patients are able to view lab/test results, encounter notes, upcoming appointments, etc.  Non-urgent messages can be sent to your provider as well.   ?To learn more about what you can do with MyChart, go to [email or web address].   ? ?Your next appointment:   ?1 year(s) ? ?The format for your next appointment:   ?In Person ? ?Provider:   ?Darci Needle III MD   ? ? ?  ?

## 2021-03-16 ENCOUNTER — Encounter: Payer: 59 | Admitting: Internal Medicine

## 2021-03-22 ENCOUNTER — Other Ambulatory Visit: Payer: 59

## 2021-03-22 ENCOUNTER — Other Ambulatory Visit: Payer: Self-pay

## 2021-03-22 DIAGNOSIS — Z113 Encounter for screening for infections with a predominantly sexual mode of transmission: Secondary | ICD-10-CM

## 2021-03-22 DIAGNOSIS — B20 Human immunodeficiency virus [HIV] disease: Secondary | ICD-10-CM

## 2021-03-22 DIAGNOSIS — Z79899 Other long term (current) drug therapy: Secondary | ICD-10-CM

## 2021-03-22 NOTE — Addendum Note (Signed)
Addended by: Valarie Cones on: 03/22/2021 11:11 AM ? ? Modules accepted: Orders ? ?

## 2021-03-22 NOTE — Progress Notes (Signed)
Patient unable to void during lab visit. Order d/c due to being released by lab. Reordered for future visit.  ?

## 2021-03-23 LAB — T-HELPER CELL (CD4) - (RCID CLINIC ONLY)
CD4 % Helper T Cell: 37 % (ref 33–65)
CD4 T Cell Abs: 1300 /uL (ref 400–1790)

## 2021-03-24 LAB — COMPLETE METABOLIC PANEL WITH GFR
AG Ratio: 1.1 (calc) (ref 1.0–2.5)
ALT: 10 U/L (ref 6–29)
AST: 13 U/L (ref 10–35)
Albumin: 3.9 g/dL (ref 3.6–5.1)
Alkaline phosphatase (APISO): 107 U/L (ref 37–153)
BUN: 15 mg/dL (ref 7–25)
CO2: 23 mmol/L (ref 20–32)
Calcium: 9.8 mg/dL (ref 8.6–10.4)
Chloride: 108 mmol/L (ref 98–110)
Creat: 0.97 mg/dL (ref 0.50–1.05)
Globulin: 3.5 g/dL (calc) (ref 1.9–3.7)
Glucose, Bld: 82 mg/dL (ref 65–99)
Potassium: 4.1 mmol/L (ref 3.5–5.3)
Sodium: 143 mmol/L (ref 135–146)
Total Bilirubin: 0.4 mg/dL (ref 0.2–1.2)
Total Protein: 7.4 g/dL (ref 6.1–8.1)
eGFR: 65 mL/min/{1.73_m2} (ref >=60)

## 2021-03-24 LAB — LIPID PANEL
Cholesterol: 198 mg/dL (ref <200)
HDL: 69 mg/dL (ref >=50)
LDL Cholesterol (Calc): 106 mg/dL (calc) — ABNORMAL HIGH
Non-HDL Cholesterol (Calc): 129 mg/dL (calc) (ref <130)
Total CHOL/HDL Ratio: 2.9 (calc) (ref <5.0)
Triglycerides: 130 mg/dL (ref <150)

## 2021-03-24 LAB — CBC WITH DIFFERENTIAL/PLATELET
Absolute Monocytes: 455 cells/uL (ref 200–950)
Basophils Absolute: 28 cells/uL (ref 0–200)
Basophils Relative: 0.4 %
Eosinophils Absolute: 207 cells/uL (ref 15–500)
Eosinophils Relative: 3 %
HCT: 36.2 % (ref 35.0–45.0)
Hemoglobin: 11.6 g/dL — ABNORMAL LOW (ref 11.7–15.5)
Lymphs Abs: 3740 cells/uL (ref 850–3900)
MCH: 28.4 pg (ref 27.0–33.0)
MCHC: 32 g/dL (ref 32.0–36.0)
MCV: 88.7 fL (ref 80.0–100.0)
MPV: 10.8 fL (ref 7.5–12.5)
Monocytes Relative: 6.6 %
Neutro Abs: 2470 cells/uL (ref 1500–7800)
Neutrophils Relative %: 35.8 %
Platelets: 336 10*3/uL (ref 140–400)
RBC: 4.08 10*6/uL (ref 3.80–5.10)
RDW: 12.8 % (ref 11.0–15.0)
Total Lymphocyte: 54.2 %
WBC: 6.9 10*3/uL (ref 3.8–10.8)

## 2021-03-24 LAB — RPR: RPR Ser Ql: NONREACTIVE

## 2021-03-24 LAB — HIV-1 RNA QUANT-NO REFLEX-BLD
HIV 1 RNA Quant: NOT DETECTED Copies/mL
HIV-1 RNA Quant, Log: NOT DETECTED Log cps/mL

## 2021-04-02 ENCOUNTER — Other Ambulatory Visit: Payer: Self-pay | Admitting: Internal Medicine

## 2021-04-02 DIAGNOSIS — B20 Human immunodeficiency virus [HIV] disease: Secondary | ICD-10-CM

## 2021-04-04 NOTE — Telephone Encounter (Signed)
Appointment on 4\4

## 2021-04-05 ENCOUNTER — Encounter: Payer: Self-pay | Admitting: Internal Medicine

## 2021-04-05 ENCOUNTER — Ambulatory Visit (INDEPENDENT_AMBULATORY_CARE_PROVIDER_SITE_OTHER): Payer: 59 | Admitting: Internal Medicine

## 2021-04-05 ENCOUNTER — Other Ambulatory Visit: Payer: Self-pay

## 2021-04-05 VITALS — BP 117/81 | HR 73 | Temp 98.0°F | Ht 66.0 in | Wt 268.0 lb

## 2021-04-05 DIAGNOSIS — Z113 Encounter for screening for infections with a predominantly sexual mode of transmission: Secondary | ICD-10-CM

## 2021-04-05 DIAGNOSIS — Z7185 Encounter for immunization safety counseling: Secondary | ICD-10-CM

## 2021-04-05 DIAGNOSIS — B20 Human immunodeficiency virus [HIV] disease: Secondary | ICD-10-CM

## 2021-04-05 DIAGNOSIS — Z79899 Other long term (current) drug therapy: Secondary | ICD-10-CM

## 2021-04-05 DIAGNOSIS — Z5181 Encounter for therapeutic drug level monitoring: Secondary | ICD-10-CM

## 2021-04-05 MED ORDER — GENVOYA 150-150-200-10 MG PO TABS: 1.0000 | ORAL_TABLET | Freq: Every day | ORAL | 11 refills | Status: DC

## 2021-04-05 NOTE — Assessment & Plan Note (Signed)
I discussed vaccines and she is not interested in any vaccines.   ?

## 2021-04-05 NOTE — Assessment & Plan Note (Signed)
Screened negative 

## 2021-04-05 NOTE — Assessment & Plan Note (Signed)
Discussed weight loss needs and she is working on that.  ?

## 2021-04-05 NOTE — Assessment & Plan Note (Signed)
She is doing well on Genvoya with no complaints, no issues.  No change indicated. Labs reassuring and she can continue with yearly follow upl ?Refills sent for the year. ?

## 2021-04-05 NOTE — Progress Notes (Signed)
? ?  Subjective:  ? ? Patient ID: Miranda Kelley, female    DOB: [birth year], 66 y.o.   MRN: [id number] ? ?HPI ?She is here for follow up of HIV ?She continues on Genvoya and denies any missed doses.  No issues with getting, taking or tolerating the medication.  No complaints today. Working on weight loss since she is hopeful for a knee replacement (or two).  Recently with a new hair color (blue) and plans to shave her head for the summer.  Does not want any vacccines.  ? ? ?Review of Systems  ?Constitutional:  Negative for fatigue.  ?Gastrointestinal:  Negative for diarrhea and nausea.  ?Skin:  Negative for rash.  ? ?   ?Objective:  ? Physical Exam ?Eyes:  ?   General: No scleral icterus. ?Pulmonary:  ?   Effort: Pulmonary effort is normal.  ?Skin: ?   Findings: No rash.  ?Neurological:  ?   General: No focal deficit present.  ?   Mental Status: She is alert.  ?Psychiatric:     ?   Mood and Affect: Mood normal.  ? ?SH: no tobacco ? ? ? ?   ?Assessment & Plan:  ? ? ?

## 2021-04-05 NOTE — Assessment & Plan Note (Signed)
Creat, LFTs wnl.  

## 2021-04-20 ENCOUNTER — Ambulatory Visit (INDEPENDENT_AMBULATORY_CARE_PROVIDER_SITE_OTHER)
Admission: RE | Admit: 2021-04-20 | Discharge: 2021-04-20 | Disposition: A | Discharge status: Home / Self Care | Payer: Self-pay | Source: Ambulatory Visit | Attending: Interventional Cardiology | Admitting: Interventional Cardiology

## 2021-04-20 DIAGNOSIS — B20 Human immunodeficiency virus [HIV] disease: Secondary | ICD-10-CM

## 2021-04-20 DIAGNOSIS — I10 Essential (primary) hypertension: Secondary | ICD-10-CM

## 2021-04-22 ENCOUNTER — Encounter: Payer: Self-pay | Admitting: Interventional Cardiology

## 2021-04-22 DIAGNOSIS — I2510 Atherosclerotic heart disease of native coronary artery without angina pectoris: Secondary | ICD-10-CM | POA: Insufficient documentation

## 2021-05-06 ENCOUNTER — Telehealth: Payer: Self-pay

## 2021-05-06 DIAGNOSIS — E785 Hyperlipidemia, unspecified: Secondary | ICD-10-CM

## 2021-05-06 MED ORDER — ASPIRIN EC 81 MG PO TBEC: 81.0000 mg | DELAYED_RELEASE_TABLET | Freq: Every day | ORAL | Status: DC

## 2021-05-06 MED ORDER — ROSUVASTATIN CALCIUM 40 MG PO TABS: 40.0000 mg | ORAL_TABLET | Freq: Every day | ORAL | 3 refills | Status: DC

## 2021-05-06 NOTE — Telephone Encounter (Signed)
Spoke with patient to discuss results of calcium score. ? ?Per Dr. Katrinka Blazing: ?Let the patient know she has high risk calcium score. Start Coated Aspirin 81 mg daily and rosuvastatin 40 mg daily. Liver and lipid panel in 6 weeks. OV for review and coaching in 6 months. Call earlier if symptoms. ? ?Patient verbalized understanding and agrees with plan above. ? ?Lab appointment and office visit scheduled. Patient states she will pick up OTC ASA. Rosuvastatin 40mg  QD sent to pharmacy of choice. Patient states she will start these both on Monday 05/09/21. ?

## 2021-05-26 ENCOUNTER — Ambulatory Visit
Admission: RE | Admit: 2021-05-26 | Discharge: 2021-05-26 | Disposition: A | Discharge status: Home / Self Care | Payer: 59 | Source: Ambulatory Visit | Attending: Family Medicine | Admitting: Family Medicine

## 2021-05-26 DIAGNOSIS — E2839 Other primary ovarian failure: Secondary | ICD-10-CM

## 2021-06-17 ENCOUNTER — Other Ambulatory Visit (HOSPITAL_BASED_OUTPATIENT_CLINIC_OR_DEPARTMENT_OTHER): Payer: Self-pay

## 2021-06-21 ENCOUNTER — Other Ambulatory Visit (HOSPITAL_BASED_OUTPATIENT_CLINIC_OR_DEPARTMENT_OTHER): Payer: Self-pay

## 2021-06-22 ENCOUNTER — Other Ambulatory Visit (HOSPITAL_BASED_OUTPATIENT_CLINIC_OR_DEPARTMENT_OTHER): Payer: Self-pay

## 2021-06-22 MED ORDER — OXYCODONE-ACETAMINOPHEN 10-325 MG PO TABS
ORAL_TABLET | ORAL | 0 refills | Status: DC
  Filled 2021-06-22: qty 120, 20d supply, fill #0

## 2021-06-24 ENCOUNTER — Other Ambulatory Visit: Payer: 59

## 2021-06-28 ENCOUNTER — Other Ambulatory Visit: Payer: 59

## 2021-06-28 DIAGNOSIS — E785 Hyperlipidemia, unspecified: Secondary | ICD-10-CM

## 2021-06-28 LAB — LIPID PANEL
Chol/HDL Ratio: 2 ratio (ref 0.0–4.4)
Cholesterol, Total: 130 mg/dL (ref 100–199)
HDL: 65 mg/dL (ref >39)
LDL Chol Calc (NIH): 51 mg/dL (ref 0–99)
Triglycerides: 71 mg/dL (ref 0–149)
VLDL Cholesterol Cal: 14 mg/dL (ref 5–40)

## 2021-06-28 LAB — COMPREHENSIVE METABOLIC PANEL
ALT: 16 IU/L (ref 0–32)
AST: 19 IU/L (ref 0–40)
Albumin/Globulin Ratio: 1.3 (ref 1.2–2.2)
Albumin: 4.1 g/dL (ref 3.8–4.8)
Alkaline Phosphatase: 108 IU/L (ref 44–121)
BUN/Creatinine Ratio: 14 (ref 12–28)
BUN: 13 mg/dL (ref 8–27)
Bilirubin Total: 0.3 mg/dL (ref 0.0–1.2)
CO2: 23 mmol/L (ref 20–29)
Calcium: 9.9 mg/dL (ref 8.7–10.3)
Chloride: 106 mmol/L (ref 96–106)
Creatinine, Ser: 0.9 mg/dL (ref 0.57–1.00)
Globulin, Total: 3.2 g/dL (ref 1.5–4.5)
Glucose: 85 mg/dL (ref 70–99)
Potassium: 4.3 mmol/L (ref 3.5–5.2)
Sodium: 142 mmol/L (ref 134–144)
Total Protein: 7.3 g/dL (ref 6.0–8.5)
eGFR: 71 mL/min/{1.73_m2} (ref >59)

## 2021-07-25 ENCOUNTER — Other Ambulatory Visit (HOSPITAL_BASED_OUTPATIENT_CLINIC_OR_DEPARTMENT_OTHER): Payer: Self-pay

## 2021-07-25 MED ORDER — OXYCODONE-ACETAMINOPHEN 10-325 MG PO TABS
ORAL_TABLET | ORAL | 0 refills | Status: DC
  Filled 2021-07-25: qty 120, 30d supply, fill #0

## 2021-08-22 ENCOUNTER — Other Ambulatory Visit (HOSPITAL_BASED_OUTPATIENT_CLINIC_OR_DEPARTMENT_OTHER): Payer: Self-pay

## 2021-08-22 MED ORDER — OXYCODONE-ACETAMINOPHEN 10-325 MG PO TABS
ORAL_TABLET | ORAL | 0 refills | Status: DC
  Filled 2021-08-22: qty 120, 30d supply, fill #0

## 2021-09-20 ENCOUNTER — Other Ambulatory Visit (HOSPITAL_BASED_OUTPATIENT_CLINIC_OR_DEPARTMENT_OTHER): Payer: Self-pay

## 2021-09-20 MED ORDER — OXYCODONE-ACETAMINOPHEN 10-325 MG PO TABS
1.0000 | ORAL_TABLET | ORAL | 0 refills | Status: DC
  Filled 2021-09-20: qty 120, 30d supply, fill #0

## 2021-10-10 ENCOUNTER — Other Ambulatory Visit (HOSPITAL_BASED_OUTPATIENT_CLINIC_OR_DEPARTMENT_OTHER): Payer: Self-pay

## 2021-10-10 MED ORDER — OXYCODONE-ACETAMINOPHEN 10-325 MG PO TABS
1.0000 | ORAL_TABLET | ORAL | 0 refills | Status: DC | PRN
  Filled 2021-10-10 – 2021-10-18 (×2): qty 120, 20d supply, fill #0

## 2021-10-18 ENCOUNTER — Other Ambulatory Visit (HOSPITAL_BASED_OUTPATIENT_CLINIC_OR_DEPARTMENT_OTHER): Payer: Self-pay

## 2021-11-05 NOTE — Progress Notes (Unsigned)
Cardiology Office Note:    Date:  11/07/2021   ID:  Miranda Kelley, DOB [birth year], MRN [id number]  PCP:  Leilani Able, MD  Cardiologist:  None   Referring MD: Leilani Able, MD   Chief Complaint  Patient presents with   Coronary Artery Disease   Hypertension   Hyperlipidemia   Congestive Heart Failure    History of Present Illness:    Miranda Kelley is a 66 y.o. female with a hx of obesity, dyspnea, family h/o CAD, hypertension, and abnormal coronary calcium score.   She does have an elevated coronary calcium score and for that reason 81 mg aspirin and statin therapy was started in spring 2023.  Cardiogram performed.  She does have some dyspnea on exertion.  She is not having any exertional chest pain.  She is phenotypically in the category of 1 you might expect to find diastolic heart failure.  Past Medical History:  Diagnosis Date   Anxiety    Arthritis    knees   Cataract    surgery - bilateral   Chronic sinus infection    HIV infection (HCC)    Hypertension    hx - no longer a problem - weight loss surgery -lost 100lbs   Thyroid disease     Past Surgical History:  Procedure Laterality Date   cataract surgery Bilateral    CHOLECYSTECTOMY  06/05/2011   Procedure: LAPAROSCOPIC CHOLECYSTECTOMY WITH INTRAOPERATIVE CHOLANGIOGRAM;  Surgeon: Mariella Saa, MD;  Location: [facility];  Service: General;  Laterality: N/A;   JOINT REPLACEMENT  06/2009   right knee   LAPAROSCOPIC GASTRIC SLEEVE RESECTION  2015   TOTAL HIP ARTHROPLASTY      Current Medications: Current Meds  Medication Sig   ALPRAZolam (XANAX) 1 MG tablet Take 1 mg by mouth 3 (three) times daily.   aspirin EC 81 MG tablet Take 1 tablet (81 mg total) by mouth daily. Swallow whole.   bisacodyl (DULCOLAX) 5 MG EC tablet Take 5 mg by mouth daily as needed for moderate constipation. Dulcolax 5 mg tab take as directed for colonoscopy prep.   Cholecalciferol (VITAMIN D3) 2000 units TABS Take 2,000 Units by mouth  daily.    clobetasol ointment (TEMOVATE) 0.05 % Reported on 03/04/2015   cyanocobalamin (,VITAMIN B-12,) 1000 MCG/ML injection Inject 1 mL into the muscle every 30 (thirty) days.   cyclobenzaprine (FLEXERIL) 10 MG tablet Take 10 mg by mouth 3 (three) times daily.   diclofenac (VOLTAREN) 75 MG EC tablet Take 75 mg by mouth 2 (two) times daily.   elvitegravir-cobicistat-emtricitabine-tenofovir (GENVOYA) 150-150-200-10 MG TABS tablet Take 1 tablet by mouth daily.   levothyroxine (SYNTHROID, LEVOTHROID) 150 MCG tablet Take 150 mcg by mouth daily.   metoCLOPramide (REGLAN) 10 MG tablet Take 10 mg by mouth 4 (four) times daily.   nystatin ointment (MYCOSTATIN) APPLY TO AFFECTED AREAS 2 TIMES A DAY   oxyCODONE-acetaminophen (PERCOCET) 10-325 MG tablet Take 1 tablet by mouth every 6 (six) hours as needed.   rosuvastatin (CRESTOR) 40 MG tablet Take 1 tablet (40 mg total) by mouth daily.   triamcinolone cream (KENALOG) 0.1 % 2 (two) times daily.     Allergies:   Patient has no known allergies.   Social History   Socioeconomic History   Marital status: Widowed    Spouse name: Not on file   Number of children: Not on file   Years of education: Not on file   Highest education level: Not on file  Occupational History  Not on file  Tobacco Use   Smoking status: Never   Smokeless tobacco: Never  Vaping Use   Vaping Use: Never used  Substance and Sexual Activity   Alcohol use: Yes    Comment: occasionally   Drug use: No   Sexual activity: Not Currently    Comment: pt. given condoms  Other Topics Concern   Not on file  Social History Narrative   Not on file   Social Determinants of Health   Financial Resource Strain: Not on file  Food Insecurity: Not on file  Transportation Needs: Not on file  Physical Activity: Not on file  Stress: Not on file  Social Connections: Not on file     Family History: The patient's family history includes Breast cancer in her maternal grandmother; Heart  disease in her mother; Heart failure in her mother. There is no history of Colon cancer, Colon polyps, Esophageal cancer, Rectal cancer, or Stomach cancer.  ROS:   Please see the history of present illness.    She denies orthopnea, PND, and snoring.  She does not sleep well.  She uses Ativan.  All other systems reviewed and are negative.  EKGs/Labs/Other Studies Reviewed:    The following studies were reviewed today:  Coronary Calcium Score 04/21/2021 IMPRESSION: Coronary calcium score of 597. This was 97th percentile for age-, race-, and sex-matched controls.  EKG:  EKG repeated  Recent Labs: 03/22/2021: Hemoglobin 11.6; Platelets 336 06/28/2021: ALT 16; BUN 13; Creatinine, Ser 0.90; Potassium 4.3; Sodium 142  Recent Lipid Panel    Component Value Date/Time   CHOL 130 06/28/2021 1215   TRIG 71 06/28/2021 1215   HDL 65 06/28/2021 1215   CHOLHDL 2.0 06/28/2021 1215   CHOLHDL 2.9 03/22/2021 1059   VLDL 21 01/06/2016 1451   LDLCALC 51 06/28/2021 1215   LDLCALC 106 (H) 03/22/2021 1059    Physical Exam:    VS:  BP 132/72   Pulse 68   Ht 5\' 6"  (1.676 m)   Wt 263 lb 6.4 oz (119.5 kg)   LMP 04/03/2011   SpO2 97%   BMI 42.51 kg/m     Wt Readings from Last 3 Encounters:  11/07/21 263 lb 6.4 oz (119.5 kg)  04/05/21 268 lb (121.6 kg)  03/08/21 269 lb 3.2 oz (122.1 kg)     GEN: Morbid. No acute distress HEENT: Normal NECK: No JVD. LYMPHATICS: No lymphadenopathy CARDIAC: No murmur. RRR no gallop, or edema. VASCULAR:  Normal Pulses. No bruits. RESPIRATORY:  Clear to auscultation without rales, wheezing or rhonchi  ABDOMEN: Soft, non-tender, non-distended, No pulsatile mass, MUSCULOSKELETAL: No deformity  SKIN: Warm and dry NEUROLOGIC:  Alert and oriented x 3 PSYCHIATRIC:  Normal affect   ASSESSMENT:    1. Agatston coronary artery calcium score greater than 400   2. Hyperlipidemia LDL goal <70   3. Essential hypertension   4. Family history of early CAD   66. Morbid  obesity ([place])   6. Human immunodeficiency virus (HIV) disease ([place])    PLAN:    In order of problems listed above:  Aspirin and statin therapy have been instituted.   LDL is at target less than 70.  Continue current therapy which has an actual value of 53 for LDL. Blood pressure target less than 140/90.   Continue to monitor the pressure closely.  No specific therapy.  Low-salt diet. We discussed diet and exercise.  Overall education and awareness concerning primary/secondary risk prevention was discussed in detail: LDL less than  70, hemoglobin A1c less than 7, blood pressure target less than 130/80 mmHg, >150 minutes of moderate aerobic activity per week, avoidance of smoking, weight control (via diet and exercise), and continued surveillance/management of/for obstructive sleep apnea.   Medication Adjustments/Labs and Tests Ordered: Current medicines are reviewed at length with the patient today.  Concerns regarding medicines are outlined above.  No orders of the defined types were placed in this encounter.  No orders of the defined types were placed in this encounter.   Patient Instructions  Medication Instructions:  Your physician recommends that you continue on your current medications as directed. Please refer to the Current Medication list given to you today.  *If you need a refill on your cardiac medications before your next appointment, please call your pharmacy*  Lab Work: NONE  Testing/Procedures: NONE  Follow-Up: At [facility], you and your health needs are our priority.  As part of our continuing mission to provide you with exceptional heart care, we have created designated Provider Care Teams.  These Care Teams include your primary Cardiologist (physician) and Advanced Practice Providers (APPs -  Physician Assistants and Nurse Practitioners) who all work together to provide you with the care you need, when you need it.  Your next appointment:   1  year(s)  The format for your next appointment:   In Person  Provider:   Berniece Salines, MD   Important Information About Sugar         Signed, Sinclair Grooms, MD  11/07/2021 4:54 PM    [place]

## 2021-11-07 ENCOUNTER — Ambulatory Visit: Payer: 59 | Attending: Interventional Cardiology | Admitting: Interventional Cardiology

## 2021-11-07 ENCOUNTER — Encounter: Payer: Self-pay | Admitting: Interventional Cardiology

## 2021-11-07 VITALS — BP 132/72 | HR 68 | Ht 66.0 in | Wt 263.4 lb

## 2021-11-07 DIAGNOSIS — Z8249 Family history of ischemic heart disease and other diseases of the circulatory system: Secondary | ICD-10-CM

## 2021-11-07 DIAGNOSIS — B20 Human immunodeficiency virus [HIV] disease: Secondary | ICD-10-CM

## 2021-11-07 DIAGNOSIS — R931 Abnormal findings on diagnostic imaging of heart and coronary circulation: Secondary | ICD-10-CM

## 2021-11-07 DIAGNOSIS — E785 Hyperlipidemia, unspecified: Secondary | ICD-10-CM

## 2021-11-07 DIAGNOSIS — I10 Essential (primary) hypertension: Secondary | ICD-10-CM

## 2021-11-07 NOTE — Patient Instructions (Signed)
Medication Instructions:  Your physician recommends that you continue on your current medications as directed. Please refer to the Current Medication list given to you today.  *If you need a refill on your cardiac medications before your next appointment, please call your pharmacy*  Lab Work: NONE  Testing/Procedures: NONE  Follow-Up: At [facility], you and your health needs are our priority.  As part of our continuing mission to provide you with exceptional heart care, we have created designated Provider Care Teams.  These Care Teams include your primary Cardiologist (physician) and Advanced Practice Providers (APPs -  Physician Assistants and Nurse Practitioners) who all work together to provide you with the care you need, when you need it.  Your next appointment:   1 year(s)  The format for your next appointment:   In Person  Provider:   Berniece Salines, MD   Important Information About Sugar

## 2021-11-08 ENCOUNTER — Other Ambulatory Visit (HOSPITAL_BASED_OUTPATIENT_CLINIC_OR_DEPARTMENT_OTHER): Payer: Self-pay

## 2021-11-08 ENCOUNTER — Other Ambulatory Visit (HOSPITAL_COMMUNITY): Payer: Self-pay

## 2021-11-08 MED ORDER — OXYCODONE-ACETAMINOPHEN 10-325 MG PO TABS
1.0000 | ORAL_TABLET | ORAL | 0 refills | Status: DC
  Filled 2021-11-08: qty 120, 20d supply, fill #0

## 2021-12-13 ENCOUNTER — Other Ambulatory Visit (HOSPITAL_BASED_OUTPATIENT_CLINIC_OR_DEPARTMENT_OTHER): Payer: Self-pay

## 2021-12-13 ENCOUNTER — Ambulatory Visit
Admission: RE | Admit: 2021-12-13 | Discharge: 2021-12-13 | Disposition: A | Discharge status: Home / Self Care | Payer: 59 | Source: Ambulatory Visit | Attending: Family Medicine | Admitting: Family Medicine

## 2021-12-13 ENCOUNTER — Other Ambulatory Visit: Payer: Self-pay | Admitting: Family Medicine

## 2021-12-13 DIAGNOSIS — R0602 Shortness of breath: Secondary | ICD-10-CM

## 2021-12-13 MED ORDER — OXYCODONE-ACETAMINOPHEN 10-325 MG PO TABS
1.0000 | ORAL_TABLET | ORAL | 0 refills | Status: DC
  Filled 2021-12-13: qty 120, 20d supply, fill #0

## 2021-12-29 ENCOUNTER — Emergency Department (HOSPITAL_BASED_OUTPATIENT_CLINIC_OR_DEPARTMENT_OTHER)
Admission: EM | Admit: 2021-12-29 | Discharge: 2021-12-29 | Disposition: A | Discharge status: Home / Self Care | Payer: 59 | Attending: Emergency Medicine | Admitting: Emergency Medicine

## 2021-12-29 ENCOUNTER — Emergency Department (HOSPITAL_BASED_OUTPATIENT_CLINIC_OR_DEPARTMENT_OTHER): Payer: 59

## 2021-12-29 ENCOUNTER — Encounter (HOSPITAL_BASED_OUTPATIENT_CLINIC_OR_DEPARTMENT_OTHER): Payer: Self-pay

## 2021-12-29 ENCOUNTER — Other Ambulatory Visit: Payer: Self-pay

## 2021-12-29 DIAGNOSIS — U071 COVID-19: Secondary | ICD-10-CM | POA: Insufficient documentation

## 2021-12-29 DIAGNOSIS — R791 Abnormal coagulation profile: Secondary | ICD-10-CM | POA: Diagnosis not present

## 2021-12-29 DIAGNOSIS — J111 Influenza due to unidentified influenza virus with other respiratory manifestations: Secondary | ICD-10-CM | POA: Insufficient documentation

## 2021-12-29 DIAGNOSIS — R600 Localized edema: Secondary | ICD-10-CM | POA: Diagnosis not present

## 2021-12-29 DIAGNOSIS — Z7982 Long term (current) use of aspirin: Secondary | ICD-10-CM | POA: Diagnosis not present

## 2021-12-29 DIAGNOSIS — R0981 Nasal congestion: Secondary | ICD-10-CM | POA: Diagnosis present

## 2021-12-29 LAB — CBC WITH DIFFERENTIAL/PLATELET
Abs Immature Granulocytes: 0.04 10*3/uL (ref 0.00–0.07)
Basophils Absolute: 0 10*3/uL (ref 0.0–0.1)
Basophils Relative: 0 %
Eosinophils Absolute: 0 10*3/uL (ref 0.0–0.5)
Eosinophils Relative: 0 %
HCT: 40.1 % (ref 36.0–46.0)
Hemoglobin: 12.9 g/dL (ref 12.0–15.0)
Immature Granulocytes: 1 %
Lymphocytes Relative: 17 %
Lymphs Abs: 1.4 10*3/uL (ref 0.7–4.0)
MCH: 29.3 pg (ref 26.0–34.0)
MCHC: 32.2 g/dL (ref 30.0–36.0)
MCV: 91.1 fL (ref 80.0–100.0)
Monocytes Absolute: 0.5 10*3/uL (ref 0.1–1.0)
Monocytes Relative: 6 %
Neutro Abs: 6.1 10*3/uL (ref 1.7–7.7)
Neutrophils Relative %: 76 %
Platelets: 191 10*3/uL (ref 150–400)
RBC: 4.4 MIL/uL (ref 3.87–5.11)
RDW: 17.2 % — ABNORMAL HIGH (ref 11.5–15.5)
WBC: 8 10*3/uL (ref 4.0–10.5)
nRBC: 0 % (ref 0.0–0.2)

## 2021-12-29 LAB — BASIC METABOLIC PANEL
Anion gap: 11 (ref 5–15)
BUN: 11 mg/dL (ref 8–23)
CO2: 23 mmol/L (ref 22–32)
Calcium: 9.5 mg/dL (ref 8.9–10.3)
Chloride: 101 mmol/L (ref 98–111)
Creatinine, Ser: 0.84 mg/dL (ref 0.44–1.00)
GFR, Estimated: >60 mL/min (ref >60)
Glucose, Bld: 125 mg/dL — ABNORMAL HIGH (ref 70–99)
Potassium: 3.5 mmol/L (ref 3.5–5.1)
Sodium: 135 mmol/L (ref 135–145)

## 2021-12-29 LAB — RESP PANEL BY RT-PCR (RSV, FLU A&B, COVID)  RVPGX2
Influenza A by PCR: POSITIVE — AB
Influenza B by PCR: NEGATIVE
Resp Syncytial Virus by PCR: NEGATIVE
SARS Coronavirus 2 by RT PCR: POSITIVE — AB

## 2021-12-29 LAB — D-DIMER, QUANTITATIVE: D-Dimer, Quant: 0.83 ug/mL-FEU — ABNORMAL HIGH (ref 0.00–0.50)

## 2021-12-29 MED ORDER — IOHEXOL 350 MG/ML SOLN
100.0000 mL | Freq: Once | INTRAVENOUS | Status: AC | PRN
  Administered 2021-12-29: 100 mL via INTRAVENOUS

## 2021-12-29 NOTE — ED Provider Notes (Signed)
[facility] EMERGENCY DEPARTMENT Provider Note   CSN: [id number] Arrival date & time: 12/29/21  1441     History  Chief Complaint  Patient presents with   URI    Alizia Jankovic is a 66 y.o. female.  Patient is a 66 year old female who presents with flulike symptoms.  She says she has had some shortness of breath for about 2 weeks but she has had dyspnea on exertion for several months and is essentially unchanged.  However about 3 days ago she started having some nasal congestion and increased shortness of breath with a little bit of coughing.  She has had some loose stools and a scratchy throat.  She has not had any known fevers although her temperature was 100.4 here in the ED.  No vomiting or diarrhea.  No associated chest pain.  She did recently travel to [place] for the holidays.  She denies any leg pain or swelling.       Home Medications Prior to Admission medications   Medication Sig Start Date End Date Taking? Authorizing Provider  ALPRAZolam Duanne Moron) 1 MG tablet Take 1 mg by mouth 3 (three) times daily. 11/07/18   [provider]  aspirin EC 81 MG tablet Take 1 tablet (81 mg total) by mouth daily. Swallow whole. 05/06/21   Belva Crome, MD  bisacodyl (DULCOLAX) 5 MG EC tablet Take 5 mg by mouth daily as needed for moderate constipation. Dulcolax 5 mg tab take as directed for colonoscopy prep.    [provider]  Cholecalciferol (VITAMIN D3) 2000 units TABS Take 2,000 Units by mouth daily.     [provider]  clobetasol ointment (TEMOVATE) 0.05 % Reported on 03/04/2015 02/22/12   [provider]  cyanocobalamin (,VITAMIN B-12,) 1000 MCG/ML injection Inject 1 mL into the muscle every 30 (thirty) days.    [provider]  cyclobenzaprine (FLEXERIL) 10 MG tablet Take 10 mg by mouth 3 (three) times daily. 01/21/19   [provider]  diclofenac (VOLTAREN) 75 MG EC tablet Take 75 mg by mouth 2 (two) times daily. 02/28/21    [provider]  elvitegravir-cobicistat-emtricitabine-tenofovir (GENVOYA) 150-150-200-10 MG TABS tablet Take 1 tablet by mouth daily. 04/05/21   Comer, Okey Regal, MD  levothyroxine (SYNTHROID, LEVOTHROID) 150 MCG tablet Take 150 mcg by mouth daily.    [provider]  metoCLOPramide (REGLAN) 10 MG tablet Take 10 mg by mouth 4 (four) times daily. 01/07/19   [provider]  nystatin ointment (MYCOSTATIN) APPLY TO AFFECTED AREAS 2 TIMES A DAY 01/21/19   [provider]  oxyCODONE-acetaminophen (PERCOCET) 10-325 MG tablet Take 1 tablet by mouth every 6 (six) hours as needed. 11/11/18   [provider]  oxyCODONE-acetaminophen (PERCOCET) 10-325 MG tablet Take 1 tablet by mouth every 4-6 hours. 12/13/21     rosuvastatin (CRESTOR) 40 MG tablet Take 1 tablet (40 mg total) by mouth daily. 05/06/21   Belva Crome, MD  triamcinolone cream (KENALOG) 0.1 % 2 (two) times daily.    [provider]      Allergies    Patient has no known allergies.    Review of Systems   Review of Systems  Constitutional:  Positive for fatigue and fever. Negative for chills and diaphoresis.  HENT:  Positive for congestion and rhinorrhea. Negative for sneezing.   Eyes: Negative.   Respiratory:  Positive for cough and shortness of breath. Negative for chest tightness.   Cardiovascular:  Negative for chest pain and  leg swelling.  Gastrointestinal:  Positive for diarrhea. Negative for abdominal pain, blood in stool, nausea and vomiting.  Genitourinary:  Negative for difficulty urinating, flank pain, frequency and hematuria.  Musculoskeletal:  Negative for arthralgias and back pain.  Skin:  Negative for rash.  Neurological:  Negative for dizziness, speech difficulty, weakness, numbness and headaches.    Physical Exam Updated Vital Signs BP (!) 126/91 (BP Location: Right Wrist)   Pulse 94   Temp 99.9 F (37.7 C) (Oral)   Resp 20   LMP 04/03/2011   SpO2 96%  Physical  Exam Constitutional:      Appearance: She is well-developed.  HENT:     Head: Normocephalic and atraumatic.  Eyes:     Pupils: Pupils are equal, round, and reactive to light.  Cardiovascular:     Rate and Rhythm: Normal rate and regular rhythm.     Heart sounds: Normal heart sounds.  Pulmonary:     Effort: Pulmonary effort is normal. No respiratory distress.     Breath sounds: Normal breath sounds. No wheezing or rales.  Chest:     Chest wall: No tenderness.  Abdominal:     General: Bowel sounds are normal.     Palpations: Abdomen is soft.     Tenderness: There is no abdominal tenderness. There is no guarding or rebound.  Musculoskeletal:        General: Normal range of motion.     Cervical back: Normal range of motion and neck supple.     Comments: Trace edema to lower extremities bilaterally  Lymphadenopathy:     Cervical: No cervical adenopathy.  Skin:    General: Skin is warm and dry.     Findings: No rash.  Neurological:     Mental Status: She is alert and oriented to person, place, and time.     ED Results / Procedures / Treatments   Labs (all labs ordered are listed, but only abnormal results are displayed) Labs Reviewed  RESP PANEL BY RT-PCR (RSV, FLU A&B, COVID)  RVPGX2 - Abnormal; Notable for the following components:      Result Value   SARS Coronavirus 2 by RT PCR POSITIVE (*)    Influenza A by PCR POSITIVE (*)    All other components within normal limits  BASIC METABOLIC PANEL - Abnormal; Notable for the following components:   Glucose, Bld 125 (*)    All other components within normal limits  CBC WITH DIFFERENTIAL/PLATELET - Abnormal; Notable for the following components:   RDW 17.2 (*)    All other components within normal limits  D-DIMER, QUANTITATIVE - Abnormal; Notable for the following components:   D-Dimer, Quant 0.83 (*)    All other components within normal limits    EKG EKG Interpretation  Date/Time:  Thursday December 29 2021 14:55:56  EST Ventricular Rate:  94 PR Interval:  130 QRS Duration: 86 QT Interval:  346 QTC Calculation: 432 R Axis:   50 Text Interpretation: Normal sinus rhythm Nonspecific ST abnormality Abnormal ECG When compared with ECG of 18-Nov-2018 17:02, PREVIOUS ECG IS PRESENT similar to prior EKG Confirmed by Rolan Bucco [phone]) on 12/29/2021 8:30:23 PM  Radiology CT Angio Chest PE W/Cm &/Or Wo Cm  Result Date: 12/29/2021 CLINICAL DATA:  Shortness of breath for 2 weeks. EXAM: CT ANGIOGRAPHY CHEST WITH CONTRAST TECHNIQUE: Multidetector CT imaging of the chest was performed using the standard protocol during bolus administration of intravenous contrast. Multiplanar CT image reconstructions and MIPs were obtained to evaluate  the vascular anatomy. RADIATION DOSE REDUCTION: This exam was performed according to the departmental dose-optimization program which includes automated exposure control, adjustment of the mA and/or kV according to patient size and/or use of iterative reconstruction technique. CONTRAST:  131mL OMNIPAQUE IOHEXOL 350 MG/ML SOLN COMPARISON:  Chest radiograph performed earlier on the same date. FINDINGS: Cardiovascular: Preferential opacification of the thoracic aorta. No evidence of central pulmonary embolism. Evaluation of distal segmental and subsegmental pulmonary arteries is limited due to phase of contrast enhancement. No evidence of thoracic aortic aneurysm or dissection. Normal heart size. No pericardial effusion. Mild coronary artery atherosclerotic calcifications. Mediastinum/Nodes: No enlarged mediastinal, hilar, or axillary lymph nodes. Thyroid gland, trachea, and esophagus demonstrate no significant findings. Lungs/Pleura: Lungs are clear. No pleural effusion or pneumothorax. Upper Abdomen: No acute abnormality. Musculoskeletal: Multilevel degenerate disc disease. Mild kyphoscoliosis. No acute osseous abnormality. Review of the MIP images confirms the above findings. IMPRESSION: 1. No  evidence of central pulmonary embolism. Evaluation of distal segmental and subsegmental pulmonary arteries is limited due to phase of contrast enhancement. 2. Lungs are clear. 3. Mild coronary artery atherosclerotic calcifications. Electronically Signed   By: Keane Police D.O.   On: 12/29/2021 22:20   DG Chest Port 1 View  Result Date: 12/29/2021 CLINICAL DATA:  Shortness of breath sore throat and cough EXAM: PORTABLE CHEST 1 VIEW COMPARISON:  12/13/2021 FINDINGS: The heart size and mediastinal contours are within normal limits. Bibasilar atelectasis. No pleural effusion or pneumothorax. The visualized skeletal structures are unremarkable. IMPRESSION: No active disease. Electronically Signed   By: Placido Sou M.D.   On: 12/29/2021 20:01    Procedures Procedures    Medications Ordered in ED Medications  iohexol (OMNIPAQUE) 350 MG/ML injection 100 mL (100 mLs Intravenous Contrast Given 12/29/21 2157)    ED Course/ Medical Decision Making/ A&P                           Medical Decision Making Amount and/or Complexity of Data Reviewed Labs: ordered. Radiology: ordered.  Risk Prescription drug management.   Patient is a 66 year old female who presents with flulike symptoms.  She initially reported some shortness of breath for 2 weeks which is when she initially gave me the impression that her symptoms started but on further questioning, she has some baseline shortness of breath for the last several months and she started feeling bad about 2 to 3 days ago with congestion and achiness and loose stools.  Chest x-ray two-view was performed which is interpreted by me and confirmed by the radiologist to show no evidence of pneumonia.  COVID and flu test were positive.  Given the shortness of breath and her recent travel history with COVID-positive, I did do a D-dimer which was elevated.  CT scan was performed which does not show any evidence of large central PE.  The contrast bolus was not ideal  but on reexam, she is not reporting any shortness of breath and she has no hypoxia.  I have a low suspicion that she has a smaller PE.  She does not have any unilateral leg swelling.  Her other labs are nonconcerning.  I discussed with her antiviral medications.  She is not a candidate for Paxlovid given her antiretroviral medication which interacts with this.  I discussed with her alternatives and Tamiflu and at this point she is declining.  She was discharged home in good condition.  She does not have any indication for hospitalization at this  point.  Symptomatic care instructions and return precautions were given.  Final Clinical Impression(s) / ED Diagnoses Final diagnoses:  COVID-19 virus infection  Influenza    Rx / DC Orders ED Discharge Orders     None         Malvin Johns, MD 12/29/21 2306

## 2021-12-29 NOTE — ED Triage Notes (Signed)
Pt c/o SHOB x2wks, thinks she might have covid. Diarrhea, sore throat, cough

## 2022-01-10 ENCOUNTER — Other Ambulatory Visit: Payer: Self-pay | Admitting: Family Medicine

## 2022-01-10 DIAGNOSIS — Z1231 Encounter for screening mammogram for malignant neoplasm of breast: Secondary | ICD-10-CM

## 2022-01-17 ENCOUNTER — Other Ambulatory Visit (HOSPITAL_COMMUNITY)
Admission: RE | Admit: 2022-01-17 | Discharge: 2022-01-17 | Disposition: A | Discharge status: Home / Self Care | Payer: 59 | Source: Ambulatory Visit | Attending: Internal Medicine | Admitting: Internal Medicine

## 2022-01-17 ENCOUNTER — Other Ambulatory Visit: Payer: 59

## 2022-01-17 ENCOUNTER — Other Ambulatory Visit: Payer: Self-pay

## 2022-01-17 DIAGNOSIS — Z113 Encounter for screening for infections with a predominantly sexual mode of transmission: Secondary | ICD-10-CM | POA: Insufficient documentation

## 2022-01-17 DIAGNOSIS — Z21 Asymptomatic human immunodeficiency virus [HIV] infection status: Secondary | ICD-10-CM | POA: Diagnosis not present

## 2022-01-17 DIAGNOSIS — Z79899 Other long term (current) drug therapy: Secondary | ICD-10-CM

## 2022-01-17 DIAGNOSIS — B20 Human immunodeficiency virus [HIV] disease: Secondary | ICD-10-CM

## 2022-01-18 LAB — URINE CYTOLOGY ANCILLARY ONLY
Chlamydia: NEGATIVE
Comment: NEGATIVE
Comment: NORMAL
Neisseria Gonorrhea: NEGATIVE

## 2022-01-18 LAB — T-HELPER CELL (CD4) - (RCID CLINIC ONLY)
CD4 % Helper T Cell: 46 % (ref 33–65)
CD4 T Cell Abs: 1405 /uL (ref 400–1790)

## 2022-01-19 ENCOUNTER — Other Ambulatory Visit (HOSPITAL_BASED_OUTPATIENT_CLINIC_OR_DEPARTMENT_OTHER): Payer: Self-pay

## 2022-01-19 LAB — LIPID PANEL
Cholesterol: 167 mg/dL (ref <200)
HDL: 92 mg/dL (ref >=50)
LDL Cholesterol (Calc): 57 mg/dL (calc)
Non-HDL Cholesterol (Calc): 75 mg/dL (calc) (ref <130)
Total CHOL/HDL Ratio: 1.8 (calc) (ref <5.0)
Triglycerides: 98 mg/dL (ref <150)

## 2022-01-19 LAB — CBC
HCT: 40.1 % (ref 35.0–45.0)
Hemoglobin: 12.7 g/dL (ref 11.7–15.5)
MCH: 29.1 pg (ref 27.0–33.0)
MCHC: 31.7 g/dL — ABNORMAL LOW (ref 32.0–36.0)
MCV: 91.8 fL (ref 80.0–100.0)
MPV: 10.8 fL (ref 7.5–12.5)
Platelets: 315 10*3/uL (ref 140–400)
RBC: 4.37 10*6/uL (ref 3.80–5.10)
RDW: 14.3 % (ref 11.0–15.0)
WBC: 9.9 10*3/uL (ref 3.8–10.8)

## 2022-01-19 LAB — COMPLETE METABOLIC PANEL WITH GFR
AG Ratio: 1.2 (calc) (ref 1.0–2.5)
ALT: 23 U/L (ref 6–29)
AST: 13 U/L (ref 10–35)
Albumin: 4 g/dL (ref 3.6–5.1)
Alkaline phosphatase (APISO): 63 U/L (ref 37–153)
BUN/Creatinine Ratio: 18 (calc) (ref 6–22)
BUN: 20 mg/dL (ref 7–25)
CO2: 24 mmol/L (ref 20–32)
Calcium: 10.2 mg/dL (ref 8.6–10.4)
Chloride: 106 mmol/L (ref 98–110)
Creat: 1.14 mg/dL — ABNORMAL HIGH (ref 0.50–1.05)
Globulin: 3.3 g/dL (calc) (ref 1.9–3.7)
Glucose, Bld: 108 mg/dL — ABNORMAL HIGH (ref 65–99)
Potassium: 4.2 mmol/L (ref 3.5–5.3)
Sodium: 141 mmol/L (ref 135–146)
Total Bilirubin: 0.3 mg/dL (ref 0.2–1.2)
Total Protein: 7.3 g/dL (ref 6.1–8.1)
eGFR: 53 mL/min/{1.73_m2} — ABNORMAL LOW (ref >=60)

## 2022-01-19 LAB — RPR: RPR Ser Ql: NONREACTIVE

## 2022-01-19 LAB — HIV-1 RNA QUANT-NO REFLEX-BLD
HIV 1 RNA Quant: NOT DETECTED Copies/mL
HIV-1 RNA Quant, Log: NOT DETECTED Log cps/mL

## 2022-01-19 MED ORDER — OXYCODONE-ACETAMINOPHEN 10-325 MG PO TABS
1.0000 | ORAL_TABLET | ORAL | 0 refills | Status: DC | PRN
  Filled 2022-01-19: qty 120, 20d supply, fill #0

## 2022-01-31 ENCOUNTER — Ambulatory Visit (INDEPENDENT_AMBULATORY_CARE_PROVIDER_SITE_OTHER): Payer: 59 | Admitting: Internal Medicine

## 2022-01-31 ENCOUNTER — Encounter: Payer: Self-pay | Admitting: Internal Medicine

## 2022-01-31 ENCOUNTER — Other Ambulatory Visit: Payer: Self-pay

## 2022-01-31 VITALS — BP 126/81 | HR 59 | Temp 97.5°F | Ht 64.0 in | Wt 260.0 lb

## 2022-01-31 DIAGNOSIS — Z79899 Other long term (current) drug therapy: Secondary | ICD-10-CM

## 2022-01-31 DIAGNOSIS — E669 Obesity, unspecified: Secondary | ICD-10-CM

## 2022-01-31 DIAGNOSIS — B20 Human immunodeficiency virus [HIV] disease: Secondary | ICD-10-CM | POA: Diagnosis not present

## 2022-01-31 DIAGNOSIS — N182 Chronic kidney disease, stage 2 (mild): Secondary | ICD-10-CM | POA: Diagnosis not present

## 2022-01-31 DIAGNOSIS — Z113 Encounter for screening for infections with a predominantly sexual mode of transmission: Secondary | ICD-10-CM | POA: Diagnosis not present

## 2022-01-31 DIAGNOSIS — Z5181 Encounter for therapeutic drug level monitoring: Secondary | ICD-10-CM

## 2022-01-31 MED ORDER — GENVOYA 150-150-200-10 MG PO TABS: 1.0000 | ORAL_TABLET | Freq: Every day | ORAL | 11 refills | Status: DC

## 2022-01-31 NOTE — Assessment & Plan Note (Signed)
Creat with mild elevation.   Stable from previousl Will continue to monitor

## 2022-01-31 NOTE — Assessment & Plan Note (Signed)
She continues to work on weight loss

## 2022-01-31 NOTE — Assessment & Plan Note (Signed)
LFTs monitored and wnl

## 2022-01-31 NOTE — Progress Notes (Signed)
   Subjective:    Patient ID: Miranda Kelley, female    DOB: [birth year], 67 y.o.   MRN: [id number]  HPI Here for follow up of HIV She continues on Genvoya and denies any missed doses.  No issues with getting or taking the medication.  No concerns today.  Still losing weight in hopes of getting a left knee replacement.    Review of Systems  Constitutional:  Negative for fatigue.  Gastrointestinal:  Negative for diarrhea.  Skin:  Negative for rash.       Objective:   Physical Exam Eyes:     General: No scleral icterus. Pulmonary:     Effort: Pulmonary effort is normal.  Neurological:     Mental Status: She is alert.   SH: no tobacco        Assessment & Plan:

## 2022-01-31 NOTE — Assessment & Plan Note (Signed)
Screened negative Low risk 

## 2022-01-31 NOTE — Assessment & Plan Note (Signed)
She continues to do well with no concerns.  Labs reviewed with her.  Continue Genovya and refills provided. Follow up in 1 year.

## 2022-02-24 ENCOUNTER — Other Ambulatory Visit (HOSPITAL_BASED_OUTPATIENT_CLINIC_OR_DEPARTMENT_OTHER): Payer: Self-pay

## 2022-02-24 MED ORDER — OXYCODONE-ACETAMINOPHEN 10-325 MG PO TABS
1.0000 | ORAL_TABLET | ORAL | 0 refills | Status: DC
  Filled 2022-02-24: qty 120, 20d supply, fill #0

## 2022-03-03 ENCOUNTER — Ambulatory Visit
Admission: RE | Admit: 2022-03-03 | Discharge: 2022-03-03 | Disposition: A | Discharge status: Home / Self Care | Payer: 59 | Source: Ambulatory Visit | Attending: Family Medicine | Admitting: Family Medicine

## 2022-03-03 DIAGNOSIS — Z1231 Encounter for screening mammogram for malignant neoplasm of breast: Secondary | ICD-10-CM

## 2022-03-21 ENCOUNTER — Other Ambulatory Visit (HOSPITAL_BASED_OUTPATIENT_CLINIC_OR_DEPARTMENT_OTHER): Payer: Self-pay

## 2022-03-21 MED ORDER — OXYCODONE-ACETAMINOPHEN 10-325 MG PO TABS
1.0000 | ORAL_TABLET | ORAL | 0 refills | Status: DC | PRN
  Filled 2022-03-21: qty 120, 20d supply, fill #0

## 2022-04-17 ENCOUNTER — Other Ambulatory Visit (HOSPITAL_BASED_OUTPATIENT_CLINIC_OR_DEPARTMENT_OTHER): Payer: Self-pay

## 2022-04-17 MED ORDER — OXYCODONE-ACETAMINOPHEN 10-325 MG PO TABS
ORAL_TABLET | ORAL | 0 refills | Status: DC
  Filled 2022-04-17: qty 120, 30d supply, fill #0

## 2022-05-03 ENCOUNTER — Other Ambulatory Visit: Payer: Self-pay

## 2022-05-03 MED ORDER — ROSUVASTATIN CALCIUM 40 MG PO TABS: 40.0000 mg | ORAL_TABLET | Freq: Every day | ORAL | 1 refills | Status: DC

## 2022-05-03 NOTE — Patient Instructions (Signed)
DUE TO COVID-19 ONLY TWO VISITORS  (aged 67 and older)  ARE ALLOWED TO COME WITH YOU AND STAY IN THE WAITING ROOM ONLY DURING PRE OP AND PROCEDURE.   **NO VISITORS ARE ALLOWED IN THE SHORT STAY AREA OR RECOVERY ROOM!!**  IF YOU WILL BE ADMITTED INTO THE HOSPITAL YOU ARE ALLOWED ONLY FOUR SUPPORT PEOPLE DURING VISITATION HOURS ONLY (7 AM -8PM)   The support person(s) must pass our screening, gel in and out, and wear a mask at all times, including in the patient's room. Patients must also wear a mask when staff or their support person are in the room. Visitors GUEST BADGE MUST BE WORN VISIBLY  One adult visitor may remain with you overnight and MUST be in the room by 8 P.M.     Your procedure is scheduled on: 05/16/22   Report to [facility] Main Entrance    Report to admitting at : 5:15 AM   Call this number if you have problems the morning of surgery [phone]   Do not eat food :After Midnight.   After Midnight you may have the following liquids until : 4:00 AM DAY OF SURGERY  Water Black Coffee (sugar ok, NO MILK/CREAM OR CREAMERS)  Tea (sugar ok, NO MILK/CREAM OR CREAMERS) regular and decaf                             Plain Jell-O (NO RED)                                           Fruit ices (not with fruit pulp, NO RED)                                     Popsicles (NO RED)                                                                  Juice: apple, WHITE grape, WHITE cranberry Sports drinks like Gatorade (NO RED)   The day of surgery:  Drink ONE (1) Pre-Surgery Clear Ensure at : 4:00 AM the morning of surgery. Drink in one sitting. Do not sip.  This drink was given to you during your hospital  pre-op appointment visit. Nothing else to drink after completing the  Pre-Surgery Clear Ensure or G2.          If you have questions, please contact your surgeon's office.   Oral Hygiene is also important to reduce your risk of infection.                                     Remember - BRUSH YOUR TEETH THE MORNING OF SURGERY WITH YOUR REGULAR TOOTHPASTE  DENTURES WILL BE REMOVED PRIOR TO SURGERY PLEASE DO NOT APPLY "Poly grip" OR ADHESIVES!!!   Do NOT smoke after Midnight   Take these medicines the morning of surgery with A SIP OF WATER: GENVOYA,LEVOTHYROXINE.USE INHALERS AS USUAL.OXICODONE,ALPRAZOLAM AS NEEDED.  You may not have any metal on your body including hair pins, jewelry, and body piercing             Do not wear make-up, lotions, powders, perfumes/cologne, or deodorant  Do not wear nail polish including gel and S&S, artificial/acrylic nails, or any other type of covering on natural nails including finger and toenails. If you have artificial nails, gel coating, etc. that needs to be removed by a nail salon please have this removed prior to surgery or surgery may need to be canceled/ delayed if the surgeon/ anesthesia feels like they are unable to be safely monitored.   Do not shave  48 hours prior to surgery.    Do not bring valuables to the hospital. [facility] IS NOT             RESPONSIBLE   FOR VALUABLES.   Contacts, glasses, or bridgework may not be worn into surgery.   Bring small overnight bag day of surgery.   DO NOT BRING YOUR HOME MEDICATIONS TO THE HOSPITAL. PHARMACY WILL DISPENSE MEDICATIONS LISTED ON YOUR MEDICATION LIST TO YOU DURING YOUR ADMISSION IN THE HOSPITAL!    Patients discharged on the day of surgery will not be allowed to drive home.  Someone NEEDS to stay with you for the first 24 hours after anesthesia.   Special Instructions: Bring a copy of your healthcare power of attorney and living will documents         the day of surgery if you haven't scanned them before.              Please read over the following fact sheets you were given: IF YOU HAVE QUESTIONS ABOUT YOUR PRE-OP INSTRUCTIONS PLEASE CALL [phone]      Incentive Spirometer  An incentive spirometer is a tool that can help  keep your lungs clear and active. This tool measures how well you are filling your lungs with each breath. Taking long deep breaths may help reverse or decrease the chance of developing breathing (pulmonary) problems (especially infection) following: A long period of time when you are unable to move or be active. BEFORE THE PROCEDURE  If the spirometer includes an indicator to show your best effort, your nurse or respiratory therapist will set it to a desired goal. If possible, sit up straight or lean slightly forward. Try not to slouch. Hold the incentive spirometer in an upright position. INSTRUCTIONS FOR USE  Sit on the edge of your bed if possible, or sit up as far as you can in bed or on a chair. Hold the incentive spirometer in an upright position. Breathe out normally. Place the mouthpiece in your mouth and seal your lips tightly around it. Breathe in slowly and as deeply as possible, raising the piston or the ball toward the top of the column. Hold your breath for 3-5 seconds or for as long as possible. Allow the piston or ball to fall to the bottom of the column. Remove the mouthpiece from your mouth and breathe out normally. Rest for a few seconds and repeat Steps 1 through 7 at least 10 times every 1-2 hours when you are awake. Take your time and take a few normal breaths between deep breaths. The spirometer may include an indicator to show your best effort. Use the indicator as a goal to work toward during each repetition. After each set of 10 deep breaths, practice coughing to be sure your lungs are clear. If you have an incision (  the cut made at the time of surgery), support your incision when coughing by placing a pillow or rolled up towels firmly against it. Once you are able to get out of bed, walk around indoors and cough well. You may stop using the incentive spirometer when instructed by your caregiver.  RISKS AND COMPLICATIONS Take your time so you do not get dizzy or  light-headed. If you are in pain, you may need to take or ask for pain medication before doing incentive spirometry. It is harder to take a deep breath if you are having pain. AFTER USE Rest and breathe slowly and easily. It can be helpful to keep track of a log of your progress. Your caregiver can provide you with a simple table to help with this. If you are using the spirometer at home, follow these instructions: SEEK MEDICAL CARE IF:  You are having difficultly using the spirometer. You have trouble using the spirometer as often as instructed. Your pain medication is not giving enough relief while using the spirometer. You develop fever of 100.5 F (38.1 C) or higher. SEEK IMMEDIATE MEDICAL CARE IF:  You cough up bloody sputum that had not been present before. You develop fever of 102 F (38.9 C) or greater. You develop worsening pain at or near the incision site. MAKE SURE YOU:  Understand these instructions. Will watch your condition. Will get help right away if you are not doing well or get worse. Document Released: 05/01/2006 Document Revised: 03/13/2011 Document Reviewed: 07/02/2006 [facility] Patient Information 2014 [place], [place].   ________________________________________________________________________

## 2022-05-04 ENCOUNTER — Encounter (HOSPITAL_COMMUNITY)
Admission: RE | Admit: 2022-05-04 | Discharge: 2022-05-04 | Disposition: A | Discharge status: Home / Self Care | Payer: 59 | Source: Ambulatory Visit | Attending: Orthopedic Surgery | Admitting: Orthopedic Surgery

## 2022-05-04 ENCOUNTER — Other Ambulatory Visit: Payer: Self-pay

## 2022-05-04 ENCOUNTER — Encounter (HOSPITAL_COMMUNITY): Payer: Self-pay

## 2022-05-04 VITALS — BP 142/99 | HR 91 | Temp 97.3°F | Ht 65.0 in | Wt 250.0 lb

## 2022-05-04 DIAGNOSIS — E039 Hypothyroidism, unspecified: Secondary | ICD-10-CM | POA: Insufficient documentation

## 2022-05-04 DIAGNOSIS — Z01818 Encounter for other preprocedural examination: Secondary | ICD-10-CM

## 2022-05-04 DIAGNOSIS — I10 Essential (primary) hypertension: Secondary | ICD-10-CM | POA: Diagnosis not present

## 2022-05-04 DIAGNOSIS — Z9884 Bariatric surgery status: Secondary | ICD-10-CM | POA: Diagnosis not present

## 2022-05-04 DIAGNOSIS — E785 Hyperlipidemia, unspecified: Secondary | ICD-10-CM | POA: Insufficient documentation

## 2022-05-04 DIAGNOSIS — M1712 Unilateral primary osteoarthritis, left knee: Secondary | ICD-10-CM | POA: Diagnosis not present

## 2022-05-04 DIAGNOSIS — Z8249 Family history of ischemic heart disease and other diseases of the circulatory system: Secondary | ICD-10-CM | POA: Insufficient documentation

## 2022-05-04 DIAGNOSIS — I2510 Atherosclerotic heart disease of native coronary artery without angina pectoris: Secondary | ICD-10-CM | POA: Insufficient documentation

## 2022-05-04 DIAGNOSIS — Z01812 Encounter for preprocedural laboratory examination: Secondary | ICD-10-CM | POA: Diagnosis present

## 2022-05-04 HISTORY — DX: Hypothyroidism, unspecified: E03.9

## 2022-05-04 HISTORY — DX: Dyspnea, unspecified: R06.00

## 2022-05-04 HISTORY — DX: Pneumonia, unspecified organism: J18.9

## 2022-05-04 LAB — SURGICAL PCR SCREEN
MRSA, PCR: NEGATIVE
Staphylococcus aureus: NEGATIVE

## 2022-05-04 LAB — CBC
HCT: 40 % (ref 36.0–46.0)
Hemoglobin: 12.5 g/dL (ref 12.0–15.0)
MCH: 29.8 pg (ref 26.0–34.0)
MCHC: 31.3 g/dL (ref 30.0–36.0)
MCV: 95.5 fL (ref 80.0–100.0)
Platelets: 248 10*3/uL (ref 150–400)
RBC: 4.19 MIL/uL (ref 3.87–5.11)
RDW: 16.8 % — ABNORMAL HIGH (ref 11.5–15.5)
WBC: 8.4 10*3/uL (ref 4.0–10.5)
nRBC: 0 % (ref 0.0–0.2)

## 2022-05-04 LAB — BASIC METABOLIC PANEL
Anion gap: 9 (ref 5–15)
BUN: 24 mg/dL — ABNORMAL HIGH (ref 8–23)
CO2: 23 mmol/L (ref 22–32)
Calcium: 10.1 mg/dL (ref 8.9–10.3)
Chloride: 107 mmol/L (ref 98–111)
Creatinine, Ser: 0.94 mg/dL (ref 0.44–1.00)
GFR, Estimated: >60 mL/min (ref >60)
Glucose, Bld: 104 mg/dL — ABNORMAL HIGH (ref 70–99)
Potassium: 3.6 mmol/L (ref 3.5–5.1)
Sodium: 139 mmol/L (ref 135–145)

## 2022-05-04 NOTE — Progress Notes (Addendum)
For Short Stay: COVID SWAB appointment date:  Bowel Prep reminder:   For Anesthesia: PCP - Dr. Haskel Schroeder Cardiologist - N/A. Last appointment with Dr. Verdis Prime: 11/07/21 (retired)  Chest x-ray - 12/29/21 EKG - 01/03/22 Stress Test -  ECHO -  Cardiac Cath -  Pacemaker/ICD device last checked: Pacemaker orders received: Device Rep notified:  Spinal Cord Stimulator: N/A  Sleep Study - Yes CPAP - NO  Fasting Blood Sugar - N/A Checks Blood Sugar _____ times a day Date and result of last Hgb A1c-  Last dose of GLP1 agonist-  GLP1 instructions:   Last dose of SGLT-2 inhibitors-  SGLT-2 instructions:   Blood Thinner Instructions: Aspirin Instructions: Last Dose:  Activity level: Can go up a flight of stairs and activities of daily living without stopping and without chest pain and/or shortness of breath   Able to exercise without chest pain and/or shortness of breath  Anesthesia review: Hx: HTN,CHF,CAD,PVC.  Patient denies shortness of breath, fever, cough and chest pain at PAT appointment   Patient verbalized understanding of instructions that were given to them at the PAT appointment. Patient was also instructed that they will need to review over the PAT instructions again at home before surgery.

## 2022-05-09 NOTE — Progress Notes (Addendum)
Case: [id number] Date/Time: 05/16/22 0700   Procedure: TOTAL KNEE ARTHROPLASTY (Left: Knee)   Anesthesia type: Spinal   Pre-op diagnosis: Left knee Osteoarthritis   Location: [facility] ROOM 09 / [facility]   Surgeons: Durene Romans, MD       DISCUSSION: Miranda Kelley is a 67 yo female who presents to PAT clinic prior to L TKA. PMH significant for HTN,CAD (seen on CT), HLD, hx of PVCs, hypothyroidism, HIV. Past surgical hx significant for laparoscopic gastric sleeve in 2015. No prior anesthesia complications.  Patient last saw Cardiology on 11/07/21. She is followed by them due to strong family hx of CAD and high coronary calcium score. Has never had a MI or cath. Primary prevention has been recommended. Her BP is controlled without medications. Patient denies CP or SOB at PAT visit.  Follows with ID for her HIV which is well controlled based off last labs in Jan 2024.  VS: BP (!) 142/99   Pulse 91   Temp (!) 36.3 C (Oral)   Ht 5\' 5"  (1.651 m)   Wt 113.4 kg   LMP 04/03/2011   SpO2 98%   BMI 41.60 kg/m   PROVIDERS: PCP: Leilani Able, MD Cardiologist - N/A. Last appointment with Dr. Verdis Prime: 11/07/21 (retired) ID: Staci Righter, MD  LABS: Labs reviewed: Acceptable for surgery. (all labs ordered are listed, but only abnormal results are displayed)  Labs Reviewed  BASIC METABOLIC PANEL - Abnormal; Notable for the following components:      Result Value   Glucose, Bld 104 (*)    BUN 24 (*)    All other components within normal limits  CBC - Abnormal; Notable for the following components:   RDW 16.8 (*)    All other components within normal limits  SURGICAL PCR SCREEN     IMAGES:  CTA chest 12/29/21:  IMPRESSION: 1. No evidence of central pulmonary embolism. Evaluation of distal segmental and subsegmental pulmonary arteries is limited due to phase of contrast enhancement. 2. Lungs are clear. 3. Mild coronary artery atherosclerotic calcifications.     EKG  01/03/22:  NSR Nonspecific ST abnormality Appears similar to prior   CV:  Exercise treadmill test 08/12/13:   ETT Interpretation:  normal - no evidence of ischemia by ST  analysis  Comments: The patient had an moderate exercise tolerance.  There was no  chest pain.  There was an appropriate level of dyspnea.  There  were no arrhythmias, a normal heart rate response and normal BP  response.  There were no ischemic ST T wave changes and a normal  heart rate recovery.  [facility] Treadmill Score was 0  Echocardiogram 2009: SUMMARY  -  Overall left ventricular systolic function was normal. Left        ventricular ejection fraction was estimated , range being 55        % to 60 %. There were no left ventricular regional wall        motion abnormalities. Left ventricular wall thickness was        mildly increased. There was mild focal basal septal        hypertrophy.  Past Medical History:  Diagnosis Date   Anxiety    Arthritis    knees   Cataract    surgery - bilateral   Chronic sinus infection    Dyspnea    HIV infection (HCC)    Hypertension    hx - no longer a problem - weight loss surgery -lost 100lbs  Hypothyroidism    Pneumonia    Thyroid disease     Past Surgical History:  Procedure Laterality Date   cataract surgery Bilateral    CHOLECYSTECTOMY  06/05/2011   Procedure: LAPAROSCOPIC CHOLECYSTECTOMY WITH INTRAOPERATIVE CHOLANGIOGRAM;  Surgeon: Mariella Saa, MD;  Location: [facility];  Service: General;  Laterality: N/A;   JOINT REPLACEMENT  06/02/2009   right knee   LAPAROSCOPIC GASTRIC SLEEVE RESECTION  01/02/2013   TOTAL HIP ARTHROPLASTY Right     MEDICATIONS:  albuterol (VENTOLIN HFA) 108 (90 Base) MCG/ACT inhaler   alendronate (FOSAMAX) 70 MG tablet   ALPRAZolam (XANAX) 1 MG tablet   aspirin EC 81 MG tablet   bisacodyl (DULCOLAX) 5 MG EC tablet   Cholecalciferol (VITAMIN D3) 2000 units TABS   clobetasol ointment (TEMOVATE) 0.05 %   cyanocobalamin  (,VITAMIN B-12,) 1000 MCG/ML injection   cyclobenzaprine (FLEXERIL) 10 MG tablet   diclofenac (VOLTAREN) 75 MG EC tablet   diclofenac Sodium (VOLTAREN) 1 % GEL   elvitegravir-cobicistat-emtricitabine-tenofovir (GENVOYA) 150-150-200-10 MG TABS tablet   levothyroxine (SYNTHROID, LEVOTHROID) 150 MCG tablet   nystatin ointment (MYCOSTATIN)   oxyCODONE-acetaminophen (PERCOCET) 10-325 MG tablet   oxyCODONE-acetaminophen (PERCOCET) 10-325 MG tablet   oxyCODONE-acetaminophen (PERCOCET) 10-325 MG tablet   oxyCODONE-acetaminophen (PERCOCET) 10-325 MG tablet   rosuvastatin (CRESTOR) 40 MG tablet   triamcinolone cream (KENALOG) 0.1 %   No current facility-administered medications for this encounter.   Marcille Blanco [facility] Short Stay/Anesthesiology [facility] Phone [phone] 05/09/2022 1:29 PM

## 2022-05-09 NOTE — Anesthesia Preprocedure Evaluation (Addendum)
Anesthesia Evaluation  Patient identified by MRN, date of birth, ID band Patient awake    Reviewed: Allergy & Precautions, NPO status , Patient's Chart, lab work & pertinent test results  History of Anesthesia Complications Negative for: history of anesthetic complications  Airway Mallampati: II  TM Distance: >3 FB Neck ROM: Full    Dental  (+) Dental Advisory Given, Teeth Intact,    Pulmonary neg shortness of breath, neg sleep apnea, neg COPD, neg recent URI   breath sounds clear to auscultation       Cardiovascular hypertension, (-) angina (-) CHF  Rhythm:Regular     Neuro/Psych  PSYCHIATRIC DISORDERS Anxiety     negative neurological ROS     GI/Hepatic ,GERD  ,,  Endo/Other  Hypothyroidism  Morbid obesity  Renal/GU negative Renal ROSLab Results      Component                Value               Date                      CREATININE               0.94                05/04/2022                Musculoskeletal   Abdominal   Peds  Hematology  (+) HIVLab Results      Component                Value               Date                      WBC                      8.4                 05/04/2022                HGB                      12.5                05/04/2022                HCT                      40.0                05/04/2022                MCV                      95.5                05/04/2022                PLT                      248                 05/04/2022              Anesthesia Other Findings   Reproductive/Obstetrics  Anesthesia Physical Anesthesia Plan  ASA: 3  Anesthesia Plan: MAC, Spinal and Regional   Post-op Pain Management:    Induction: Intravenous  PONV Risk Score and Plan: 2 and Ondansetron and Propofol infusion  Airway Management Planned: Nasal Cannula and Natural Airway  Additional Equipment: None  Intra-op Plan:    Post-operative Plan:   Informed Consent: I have reviewed the patients History and Physical, chart, labs and discussed the procedure including the risks, benefits and alternatives for the proposed anesthesia with the patient or authorized representative who has indicated his/her understanding and acceptance.     Dental advisory given  Plan Discussed with: CRNA  Anesthesia Plan Comments: (See PAT note from 5/2 by Sherlie Ban PA-C)        Anesthesia Quick Evaluation

## 2022-05-12 ENCOUNTER — Other Ambulatory Visit (HOSPITAL_COMMUNITY): Payer: Self-pay

## 2022-05-12 ENCOUNTER — Other Ambulatory Visit: Payer: Self-pay

## 2022-05-12 DIAGNOSIS — B20 Human immunodeficiency virus [HIV] disease: Secondary | ICD-10-CM

## 2022-05-12 MED ORDER — GENVOYA 150-150-200-10 MG PO TABS
1.0000 | ORAL_TABLET | Freq: Every day | ORAL | 0 refills | Status: DC
Start: 2022-05-12 — End: 2023-02-15
  Filled 2022-05-12 (×3): qty 30, 30d supply, fill #0

## 2022-05-15 NOTE — H&P (Signed)
TOTAL KNEE ADMISSION H&P  Patient is being admitted for left total knee arthroplasty.  Subjective:  Chief Complaint:left knee pain.  HPI: Miranda Kelley, 67 y.o. female, has a history of pain and functional disability in the left knee due to arthritis and has failed non-surgical conservative treatments for greater than 12 weeks to includeNSAID's and/or analgesics, corticosteriod injections, and activity modification.  Onset of symptoms was gradual, starting 3 years ago with gradually worsening course since that time. The patient noted no past surgery on the left knee(s).  Patient currently rates pain in the left knee(s) at 8 out of 10 with activity. Patient has worsening of pain with activity and weight bearing, pain that interferes with activities of daily living, and pain with passive range of motion.  Patient has evidence of joint space narrowing by imaging studies.  There is no active infection.  Patient Active Problem List   Diagnosis Date Noted   Chronic renal insufficiency, stage 2 (mild) 01/31/2022   CAD (coronary artery disease), native coronary artery 04/22/2021   Morbid obesity (HCC) 01/08/2019   Vaccine counseling 11/30/2016   Osteoporosis 01/10/2016   Medication monitoring encounter 01/10/2016   Encounter for long-term (current) use of medications 04/23/2013   Screening examination for venereal disease 04/23/2013   Depressive episode 09/19/2011   Symptomatic cholelithiasis 06/04/2011   HTN (hypertension) 02/02/2011   Human immunodeficiency virus (HIV) disease (HCC) 12/01/2008   PVC (premature ventricular contraction) 11/19/2008   DENTAL CARIES 07/01/2008   OSTEOARTHRITIS, HIP, RIGHT 08/24/2007   GERD 12/18/2006   Obesity 10/05/2006   Pain in joint, ankle and foot 10/05/2006   HSV 09/28/2006   Past Medical History:  Diagnosis Date   Anxiety    Arthritis    knees   Cataract    surgery - bilateral   Chronic sinus infection    Dyspnea    HIV infection (HCC)     Hypertension    hx - no longer a problem - weight loss surgery -lost 100lbs   Hypothyroidism    Pneumonia    Thyroid disease     Past Surgical History:  Procedure Laterality Date   cataract surgery Bilateral    CHOLECYSTECTOMY  06/05/2011   Procedure: LAPAROSCOPIC CHOLECYSTECTOMY WITH INTRAOPERATIVE CHOLANGIOGRAM;  Surgeon: Mariella Saa, MD;  Location: [facility];  Service: General;  Laterality: N/A;   JOINT REPLACEMENT  06/02/2009   right knee   LAPAROSCOPIC GASTRIC SLEEVE RESECTION  01/02/2013   TOTAL HIP ARTHROPLASTY Right     No current facility-administered medications for this encounter.   Current Outpatient Medications  Medication Sig Dispense Refill Last Dose   albuterol (VENTOLIN HFA) 108 (90 Base) MCG/ACT inhaler Inhale 2 puffs into the lungs every 6 (six) hours as needed for wheezing or shortness of breath.      alendronate (FOSAMAX) 70 MG tablet Take 70 mg by mouth once a week.      ALPRAZolam (XANAX) 1 MG tablet Take 1 mg by mouth 3 (three) times daily as needed for anxiety.      aspirin EC 81 MG tablet Take 1 tablet (81 mg total) by mouth daily. Swallow whole.      bisacodyl (DULCOLAX) 5 MG EC tablet Take 5 mg by mouth daily as needed for moderate constipation. Dulcolax 5 mg tab take as directed for colonoscopy prep.      Cholecalciferol (VITAMIN D3) 2000 units TABS Take 2,000 Units by mouth daily.       clobetasol ointment (TEMOVATE) 0.05 % Apply 1 Application  topically 2 (two) times daily.      cyanocobalamin (,VITAMIN B-12,) 1000 MCG/ML injection Inject 1,000 mcg into the muscle every 30 (thirty) days.      cyclobenzaprine (FLEXERIL) 10 MG tablet Take 10 mg by mouth 3 (three) times daily as needed for muscle spasms.      diclofenac (VOLTAREN) 75 MG EC tablet Take 75 mg by mouth 2 (two) times daily.      diclofenac Sodium (VOLTAREN) 1 % GEL Apply 2 g topically daily as needed (pain).      elvitegravir-cobicistat-emtricitabine-tenofovir (GENVOYA) 150-150-200-10 MG  TABS tablet Take 1 tablet by mouth daily. 30 tablet 11    levothyroxine (SYNTHROID, LEVOTHROID) 150 MCG tablet Take 150 mcg by mouth daily before breakfast.      nystatin ointment (MYCOSTATIN) 1 Application daily as needed (yeast).      oxyCODONE-acetaminophen (PERCOCET) 10-325 MG tablet Take 1 tablet by mouth as needed every 4-6 hours 120 tablet 0    rosuvastatin (CRESTOR) 40 MG tablet Take 1 tablet (40 mg total) by mouth daily. 90 tablet 1    triamcinolone cream (KENALOG) 0.1 % Apply 1 Application topically daily as needed (irritation).      elvitegravir-cobicistat-emtricitabine-tenofovir (GENVOYA) 150-150-200-10 MG TABS tablet Take 1 tablet by mouth daily with breakfast. 30 tablet 0    oxyCODONE-acetaminophen (PERCOCET) 10-325 MG tablet Take 1 tablet by mouth every 4-6 hours. (Patient not taking: Reported on 05/03/2022) 120 tablet 0 Not Taking   oxyCODONE-acetaminophen (PERCOCET) 10-325 MG tablet Take 1 tablet by mouth as needed every 4-6 hours (Patient not taking: Reported on 05/03/2022) 120 tablet 0 Not Taking   oxyCODONE-acetaminophen (PERCOCET) 10-325 MG tablet Take 1 tablet by mouth every 4 (four) to 6 (six) hours as needed. (Patient not taking: Reported on 05/03/2022) 120 tablet 0 Not Taking   No Known Allergies  Social History   Tobacco Use   Smoking status: Never   Smokeless tobacco: Never  Substance Use Topics   Alcohol use: Yes    Comment: occasionally    Family History  Problem Relation Age of Onset   Heart failure Mother    Heart disease Mother    Breast cancer Maternal Grandmother    Colon cancer Neg Hx    Colon polyps Neg Hx    Esophageal cancer Neg Hx    Rectal cancer Neg Hx    Stomach cancer Neg Hx      Review of Systems  Constitutional:  Negative for chills and fever.  Respiratory:  Negative for cough and shortness of breath.   Cardiovascular:  Negative for chest pain.  Gastrointestinal:  Negative for nausea and vomiting.  Musculoskeletal:  Positive for  arthralgias.     Objective:  Physical Exam Well nourished and well developed. General: Alert and oriented x3, cooperative and pleasant, no acute distress. Head: normocephalic, atraumatic, neck supple. Eyes: EOMI.  Musculoskeletal: Left knee exam: No palpable effusion, warmth erythema She does carry a lot of her weight into her lower extremities but there is no evidence of any active cellulitic concerns around the knee She does have about a 5 degree flexion contracture with flexion over 90 degrees with tightness and pain No significant or obvious lower extremity edema at this point   Calves soft and nontender. Motor function intact in LE. Strength 5/5 LE bilaterally. Neuro: Distal pulses 2+. Sensation to light touch intact in LE.  Vital signs in last 24 hours:    Labs:   Estimated body mass index is 41.6 kg/m as calculated  from the following:   Height as of 05/04/22: 5\' 5"  (1.651 m).   Weight as of 05/04/22: 113.4 kg.   Imaging Review Plain radiographs demonstrate severe degenerative joint disease of the left knee(s). The overall alignment isneutral. The bone quality appears to be adequate for age and reported activity level.      Assessment/Plan:  End stage arthritis, left knee   The patient history, physical examination, clinical judgment of the provider and imaging studies are consistent with end stage degenerative joint disease of the left knee(s) and total knee arthroplasty is deemed medically necessary. The treatment options including medical management, injection therapy arthroscopy and arthroplasty were discussed at length. The risks and benefits of total knee arthroplasty were presented and reviewed. The risks due to aseptic loosening, infection, stiffness, patella tracking problems, thromboembolic complications and other imponderables were discussed. The patient acknowledged the explanation, agreed to proceed with the plan and consent was signed. Patient is being  admitted for inpatient treatment for surgery, pain control, PT, OT, prophylactic antibiotics, VTE prophylaxis, progressive ambulation and ADL's and discharge planning. The patient is planning to be discharged  home.  Therapy Plans: outpatient therapy at [facility] Disposition: Home with kids (lives together for now) Planned DVT Prophylaxis: aspirin 81mg  BID DME needed: walker PCP: Dr. Pecola Leisure, TXA: IV Allergies: NKDA Anesthesia Concerns: none BMI: 46 Last HgbA1c: Not diabetic   Other:  - No hx of VTE or cancer - Perocet 10 mg q4-6 hours per Dr. Pecola Leisure (will oxycodone 15 mg q4 after) - tizanidine, tylenol, celebrex - hx of right TKA and hip replacement   Patient's anticipated LOS is less than 2 midnights, meeting these requirements: - Younger than 17 - Lives within 1 hour of care - Has a competent adult at home to recover with post-op recover - NO history of  - Chronic pain requiring opiods  - Diabetes  - Coronary Artery Disease  - Heart failure  - Heart attack  - Stroke  - DVT/VTE  - Cardiac arrhythmia  - Respiratory Failure/COPD  - Renal failure  - Anemia  - Advanced Liver disease  Rosalene Billings, PA-C Orthopedic Surgery [facility] [place] [phone]

## 2022-05-16 ENCOUNTER — Observation Stay (HOSPITAL_COMMUNITY)
Admission: RE | Admit: 2022-05-16 | Discharge: 2022-05-17 | Disposition: A | Discharge status: Home / Self Care | Payer: 59 | Attending: Orthopedic Surgery | Admitting: Orthopedic Surgery

## 2022-05-16 ENCOUNTER — Encounter (HOSPITAL_COMMUNITY): Payer: Self-pay | Admitting: Orthopedic Surgery

## 2022-05-16 ENCOUNTER — Encounter (HOSPITAL_COMMUNITY)
Admission: RE | Disposition: A | Discharge status: Home / Self Care | Payer: Self-pay | Source: Home / Self Care | Attending: Orthopedic Surgery

## 2022-05-16 ENCOUNTER — Other Ambulatory Visit: Payer: Self-pay

## 2022-05-16 ENCOUNTER — Ambulatory Visit (HOSPITAL_COMMUNITY): Payer: 59 | Admitting: Medical

## 2022-05-16 ENCOUNTER — Ambulatory Visit (HOSPITAL_BASED_OUTPATIENT_CLINIC_OR_DEPARTMENT_OTHER): Payer: 59 | Admitting: Certified Registered"

## 2022-05-16 DIAGNOSIS — Z79899 Other long term (current) drug therapy: Secondary | ICD-10-CM | POA: Insufficient documentation

## 2022-05-16 DIAGNOSIS — Z6841 Body Mass Index (BMI) 40.0 and over, adult: Secondary | ICD-10-CM

## 2022-05-16 DIAGNOSIS — I2510 Atherosclerotic heart disease of native coronary artery without angina pectoris: Secondary | ICD-10-CM | POA: Insufficient documentation

## 2022-05-16 DIAGNOSIS — M1712 Unilateral primary osteoarthritis, left knee: Principal | ICD-10-CM | POA: Insufficient documentation

## 2022-05-16 DIAGNOSIS — Z21 Asymptomatic human immunodeficiency virus [HIV] infection status: Secondary | ICD-10-CM | POA: Diagnosis not present

## 2022-05-16 DIAGNOSIS — Z96641 Presence of right artificial hip joint: Secondary | ICD-10-CM | POA: Diagnosis not present

## 2022-05-16 DIAGNOSIS — I129 Hypertensive chronic kidney disease with stage 1 through stage 4 chronic kidney disease, or unspecified chronic kidney disease: Secondary | ICD-10-CM | POA: Insufficient documentation

## 2022-05-16 DIAGNOSIS — E039 Hypothyroidism, unspecified: Secondary | ICD-10-CM | POA: Diagnosis not present

## 2022-05-16 DIAGNOSIS — B20 Human immunodeficiency virus [HIV] disease: Secondary | ICD-10-CM | POA: Insufficient documentation

## 2022-05-16 DIAGNOSIS — N182 Chronic kidney disease, stage 2 (mild): Secondary | ICD-10-CM | POA: Diagnosis not present

## 2022-05-16 DIAGNOSIS — I10 Essential (primary) hypertension: Secondary | ICD-10-CM

## 2022-05-16 DIAGNOSIS — Z96651 Presence of right artificial knee joint: Secondary | ICD-10-CM | POA: Insufficient documentation

## 2022-05-16 DIAGNOSIS — Z7982 Long term (current) use of aspirin: Secondary | ICD-10-CM | POA: Insufficient documentation

## 2022-05-16 DIAGNOSIS — Z96652 Presence of left artificial knee joint: Secondary | ICD-10-CM

## 2022-05-16 HISTORY — PX: TOTAL KNEE ARTHROPLASTY: SHX125

## 2022-05-16 SURGERY — ARTHROPLASTY, KNEE, TOTAL
Anesthesia: Monitor Anesthesia Care | Site: Knee | Laterality: Left

## 2022-05-16 MED ORDER — 0.9 % SODIUM CHLORIDE (POUR BTL) OPTIME
TOPICAL | Status: DC | PRN
  Administered 2022-05-16: 1000 mL

## 2022-05-16 MED ORDER — PROPOFOL 10 MG/ML IV BOLUS
INTRAVENOUS | Status: AC
  Filled 2022-05-16: qty 20

## 2022-05-16 MED ORDER — SODIUM CHLORIDE (PF) 0.9 % IJ SOLN
INTRAMUSCULAR | Status: AC
  Filled 2022-05-16: qty 30

## 2022-05-16 MED ORDER — PROPOFOL 1000 MG/100ML IV EMUL
INTRAVENOUS | Status: AC
  Filled 2022-05-16: qty 100

## 2022-05-16 MED ORDER — ACETAMINOPHEN 500 MG PO TABS: 1000.0000 mg | ORAL_TABLET | Freq: Once | ORAL | Status: DC | PRN

## 2022-05-16 MED ORDER — OXYCODONE HCL 5 MG/5ML PO SOLN: 5.0000 mg | Freq: Once | ORAL | Status: DC | PRN

## 2022-05-16 MED ORDER — KETOROLAC TROMETHAMINE 30 MG/ML IJ SOLN
INTRAMUSCULAR | Status: AC
  Filled 2022-05-16: qty 1

## 2022-05-16 MED ORDER — PHENYLEPHRINE 80 MCG/ML (10ML) SYRINGE FOR IV PUSH (FOR BLOOD PRESSURE SUPPORT)
PREFILLED_SYRINGE | INTRAVENOUS | Status: DC | PRN
  Administered 2022-05-16: 80 ug via INTRAVENOUS

## 2022-05-16 MED ORDER — DEXAMETHASONE SODIUM PHOSPHATE 10 MG/ML IJ SOLN
8.0000 mg | Freq: Once | INTRAMUSCULAR | Status: AC
  Administered 2022-05-16: 8 mg via INTRAVENOUS

## 2022-05-16 MED ORDER — TRANEXAMIC ACID-NACL 1000-0.7 MG/100ML-% IV SOLN
1000.0000 mg | Freq: Once | INTRAVENOUS | Status: AC
  Administered 2022-05-16: 1000 mg via INTRAVENOUS
  Filled 2022-05-16: qty 100

## 2022-05-16 MED ORDER — PROPOFOL 500 MG/50ML IV EMUL
INTRAVENOUS | Status: DC | PRN
  Administered 2022-05-16: 100 ug/kg/min via INTRAVENOUS

## 2022-05-16 MED ORDER — PROPOFOL 500 MG/50ML IV EMUL
INTRAVENOUS | Status: AC
  Filled 2022-05-16: qty 50

## 2022-05-16 MED ORDER — BUPIVACAINE-EPINEPHRINE (PF) 0.25% -1:200000 IJ SOLN
INTRAMUSCULAR | Status: DC | PRN
  Administered 2022-05-16: 30 mL

## 2022-05-16 MED ORDER — ACETAMINOPHEN 325 MG PO TABS: 325.0000 mg | ORAL_TABLET | Freq: Four times a day (QID) | ORAL | Status: DC | PRN

## 2022-05-16 MED ORDER — CEFAZOLIN SODIUM-DEXTROSE 2-4 GM/100ML-% IV SOLN
2.0000 g | Freq: Four times a day (QID) | INTRAVENOUS | Status: AC
  Administered 2022-05-16 (×2): 2 g via INTRAVENOUS
  Filled 2022-05-16 (×2): qty 100

## 2022-05-16 MED ORDER — FENTANYL CITRATE (PF) 100 MCG/2ML IJ SOLN
INTRAMUSCULAR | Status: DC | PRN
  Administered 2022-05-16: 50 ug via INTRAVENOUS

## 2022-05-16 MED ORDER — LACTATED RINGERS IV SOLN: INTRAVENOUS | Status: DC

## 2022-05-16 MED ORDER — ALBUTEROL SULFATE (2.5 MG/3ML) 0.083% IN NEBU: 3.0000 mL | INHALATION_SOLUTION | Freq: Four times a day (QID) | RESPIRATORY_TRACT | Status: DC | PRN

## 2022-05-16 MED ORDER — LEVOTHYROXINE SODIUM 75 MCG PO TABS
150.0000 ug | ORAL_TABLET | Freq: Every day | ORAL | Status: DC
  Administered 2022-05-17: 150 ug via ORAL
  Filled 2022-05-16: qty 2

## 2022-05-16 MED ORDER — OXYCODONE HCL 5 MG PO TABS: 10.0000 mg | ORAL_TABLET | ORAL | Status: DC | PRN

## 2022-05-16 MED ORDER — CEFAZOLIN SODIUM-DEXTROSE 2-4 GM/100ML-% IV SOLN
2.0000 g | INTRAVENOUS | Status: AC
  Administered 2022-05-16: 2 g via INTRAVENOUS
  Filled 2022-05-16: qty 100

## 2022-05-16 MED ORDER — SODIUM CHLORIDE (PF) 0.9 % IJ SOLN
INTRAMUSCULAR | Status: DC | PRN
  Administered 2022-05-16: 30 mL

## 2022-05-16 MED ORDER — CEFAZOLIN SODIUM-DEXTROSE 2-4 GM/100ML-% IV SOLN
INTRAVENOUS | Status: AC
  Filled 2022-05-16: qty 100

## 2022-05-16 MED ORDER — POVIDONE-IODINE 10 % EX SWAB: 2.0000 | Freq: Once | CUTANEOUS | Status: DC

## 2022-05-16 MED ORDER — FENTANYL CITRATE PF 50 MCG/ML IJ SOSY: 25.0000 ug | PREFILLED_SYRINGE | INTRAMUSCULAR | Status: DC | PRN

## 2022-05-16 MED ORDER — ACETAMINOPHEN 160 MG/5ML PO SOLN: 1000.0000 mg | Freq: Once | ORAL | Status: DC | PRN

## 2022-05-16 MED ORDER — CELECOXIB 200 MG PO CAPS
200.0000 mg | ORAL_CAPSULE | Freq: Every day | ORAL | Status: DC
  Administered 2022-05-16 – 2022-05-17 (×2): 200 mg via ORAL
  Filled 2022-05-16 (×2): qty 1

## 2022-05-16 MED ORDER — ONDANSETRON HCL 4 MG/2ML IJ SOLN
INTRAMUSCULAR | Status: DC | PRN
  Administered 2022-05-16: 4 mg via INTRAVENOUS

## 2022-05-16 MED ORDER — ONDANSETRON HCL 4 MG PO TABS: 4.0000 mg | ORAL_TABLET | Freq: Four times a day (QID) | ORAL | Status: DC | PRN

## 2022-05-16 MED ORDER — DEXAMETHASONE SODIUM PHOSPHATE 10 MG/ML IJ SOLN
INTRAMUSCULAR | Status: AC
  Filled 2022-05-16: qty 1

## 2022-05-16 MED ORDER — FENTANYL CITRATE (PF) 100 MCG/2ML IJ SOLN
INTRAMUSCULAR | Status: AC
  Filled 2022-05-16: qty 2

## 2022-05-16 MED ORDER — METOCLOPRAMIDE HCL 5 MG PO TABS: 5.0000 mg | ORAL_TABLET | Freq: Three times a day (TID) | ORAL | Status: DC | PRN

## 2022-05-16 MED ORDER — MIDAZOLAM HCL 2 MG/2ML IJ SOLN
INTRAMUSCULAR | Status: DC | PRN
  Administered 2022-05-16: 2 mg via INTRAVENOUS

## 2022-05-16 MED ORDER — ROSUVASTATIN CALCIUM 20 MG PO TABS
40.0000 mg | ORAL_TABLET | Freq: Every day | ORAL | Status: DC
  Administered 2022-05-16 – 2022-05-17 (×2): 40 mg via ORAL
  Filled 2022-05-16 (×2): qty 2

## 2022-05-16 MED ORDER — ASPIRIN 81 MG PO CHEW
81.0000 mg | CHEWABLE_TABLET | Freq: Two times a day (BID) | ORAL | Status: DC
  Administered 2022-05-16 – 2022-05-17 (×2): 81 mg via ORAL
  Filled 2022-05-16 (×2): qty 1

## 2022-05-16 MED ORDER — POLYETHYLENE GLYCOL 3350 17 G PO PACK
17.0000 g | PACK | Freq: Two times a day (BID) | ORAL | Status: DC
  Administered 2022-05-16 – 2022-05-17 (×2): 17 g via ORAL
  Filled 2022-05-16 (×2): qty 1

## 2022-05-16 MED ORDER — PROPOFOL 10 MG/ML IV BOLUS
INTRAVENOUS | Status: DC | PRN
  Administered 2022-05-16: 30 mg via INTRAVENOUS

## 2022-05-16 MED ORDER — MIDAZOLAM HCL 2 MG/2ML IJ SOLN
INTRAMUSCULAR | Status: AC
  Filled 2022-05-16: qty 2

## 2022-05-16 MED ORDER — ACETAMINOPHEN 500 MG PO TABS
1000.0000 mg | ORAL_TABLET | Freq: Four times a day (QID) | ORAL | Status: AC
  Administered 2022-05-16 – 2022-05-17 (×4): 1000 mg via ORAL
  Filled 2022-05-16 (×4): qty 2

## 2022-05-16 MED ORDER — BUPIVACAINE IN DEXTROSE 0.75-8.25 % IT SOLN
INTRATHECAL | Status: DC | PRN
  Administered 2022-05-16: 1.8 mL via INTRATHECAL

## 2022-05-16 MED ORDER — OXYCODONE HCL 5 MG PO TABS: 5.0000 mg | ORAL_TABLET | Freq: Once | ORAL | Status: DC | PRN

## 2022-05-16 MED ORDER — SODIUM CHLORIDE 0.9 % IR SOLN
Status: DC | PRN
  Administered 2022-05-16: 1000 mL

## 2022-05-16 MED ORDER — TIZANIDINE HCL 4 MG PO TABS
4.0000 mg | ORAL_TABLET | Freq: Three times a day (TID) | ORAL | Status: DC | PRN
  Administered 2022-05-16 – 2022-05-17 (×2): 4 mg via ORAL
  Filled 2022-05-16 (×2): qty 1

## 2022-05-16 MED ORDER — METOCLOPRAMIDE HCL 5 MG/ML IJ SOLN: 5.0000 mg | Freq: Three times a day (TID) | INTRAMUSCULAR | Status: DC | PRN

## 2022-05-16 MED ORDER — BISACODYL 10 MG RE SUPP: 10.0000 mg | Freq: Every day | RECTAL | Status: DC | PRN

## 2022-05-16 MED ORDER — OXYCODONE HCL 5 MG PO TABS
15.0000 mg | ORAL_TABLET | ORAL | Status: DC | PRN
  Administered 2022-05-16 – 2022-05-17 (×6): 15 mg via ORAL
  Filled 2022-05-16 (×7): qty 3

## 2022-05-16 MED ORDER — DEXAMETHASONE SODIUM PHOSPHATE 10 MG/ML IJ SOLN
10.0000 mg | Freq: Once | INTRAMUSCULAR | Status: AC
  Administered 2022-05-17: 10 mg via INTRAVENOUS
  Filled 2022-05-16: qty 1

## 2022-05-16 MED ORDER — ACETAMINOPHEN 10 MG/ML IV SOLN: 1000.0000 mg | Freq: Once | INTRAVENOUS | Status: DC | PRN

## 2022-05-16 MED ORDER — CHLORHEXIDINE GLUCONATE 0.12 % MT SOLN
15.0000 mL | Freq: Once | OROMUCOSAL | Status: AC
  Administered 2022-05-16: 15 mL via OROMUCOSAL

## 2022-05-16 MED ORDER — HYDROMORPHONE HCL 1 MG/ML IJ SOLN: 0.5000 mg | INTRAMUSCULAR | Status: DC | PRN

## 2022-05-16 MED ORDER — ROPIVACAINE HCL 7.5 MG/ML IJ SOLN
INTRAMUSCULAR | Status: DC | PRN
  Administered 2022-05-16: 20 mL via PERINEURAL

## 2022-05-16 MED ORDER — SODIUM CHLORIDE 0.9 % IV SOLN: INTRAVENOUS | Status: DC

## 2022-05-16 MED ORDER — PHENYLEPHRINE 80 MCG/ML (10ML) SYRINGE FOR IV PUSH (FOR BLOOD PRESSURE SUPPORT)
PREFILLED_SYRINGE | INTRAVENOUS | Status: AC
  Filled 2022-05-16: qty 10

## 2022-05-16 MED ORDER — ELVITEG-COBIC-EMTRICIT-TENOFAF 150-150-200-10 MG PO TABS
1.0000 | ORAL_TABLET | Freq: Every day | ORAL | Status: DC
  Administered 2022-05-17: 1 via ORAL
  Filled 2022-05-16: qty 1

## 2022-05-16 MED ORDER — TRANEXAMIC ACID-NACL 1000-0.7 MG/100ML-% IV SOLN
1000.0000 mg | INTRAVENOUS | Status: AC
  Administered 2022-05-16: 1000 mg via INTRAVENOUS
  Filled 2022-05-16: qty 100

## 2022-05-16 MED ORDER — DOCUSATE SODIUM 100 MG PO CAPS
100.0000 mg | ORAL_CAPSULE | Freq: Two times a day (BID) | ORAL | Status: DC
  Administered 2022-05-16 – 2022-05-17 (×2): 100 mg via ORAL
  Filled 2022-05-16 (×2): qty 1

## 2022-05-16 MED ORDER — KETOROLAC TROMETHAMINE 30 MG/ML IJ SOLN
INTRAMUSCULAR | Status: DC | PRN
  Administered 2022-05-16: 30 mg via INTRAMUSCULAR

## 2022-05-16 MED ORDER — DIPHENHYDRAMINE HCL 12.5 MG/5ML PO ELIX: 12.5000 mg | ORAL_SOLUTION | ORAL | Status: DC | PRN

## 2022-05-16 MED ORDER — ONDANSETRON HCL 4 MG/2ML IJ SOLN
INTRAMUSCULAR | Status: AC
  Filled 2022-05-16: qty 2

## 2022-05-16 MED ORDER — ALPRAZOLAM 0.5 MG PO TABS
1.0000 mg | ORAL_TABLET | Freq: Three times a day (TID) | ORAL | Status: DC | PRN
  Administered 2022-05-16: 1 mg via ORAL
  Filled 2022-05-16: qty 2

## 2022-05-16 MED ORDER — MENTHOL 3 MG MT LOZG: 1.0000 | LOZENGE | OROMUCOSAL | Status: DC | PRN

## 2022-05-16 MED ORDER — ONDANSETRON HCL 4 MG/2ML IJ SOLN: 4.0000 mg | Freq: Four times a day (QID) | INTRAMUSCULAR | Status: DC | PRN

## 2022-05-16 MED ORDER — ORAL CARE MOUTH RINSE: 15.0000 mL | Freq: Once | OROMUCOSAL | Status: AC

## 2022-05-16 MED ORDER — PHENOL 1.4 % MT LIQD: 1.0000 | OROMUCOSAL | Status: DC | PRN

## 2022-05-16 SURGICAL SUPPLY — 58 items
ADH SKN CLS APL DERMABOND .7 (GAUZE/BANDAGES/DRESSINGS) ×1
ATTUNE MED ANAT PAT 38 KNEE (Knees) IMPLANT
ATTUNE PSFEM LTSZ6 NARCEM KNEE (Femur) IMPLANT
ATTUNE PSRP INSR SZ6 8 KNEE (Insert) IMPLANT
BAG COUNTER SPONGE SURGICOUNT (BAG) IMPLANT
BAG SPEC THK2 15X12 ZIP CLS (MISCELLANEOUS)
BAG SPNG CNTER NS LX DISP (BAG)
BAG ZIPLOCK 12X15 (MISCELLANEOUS) IMPLANT
BASE TIBIA ATTUNE KNEE SYS SZ6 (Knees) IMPLANT
BLADE SAW SGTL 11.0X1.19X90.0M (BLADE) IMPLANT
BLADE SAW SGTL 13.0X1.19X90.0M (BLADE) ×2 IMPLANT
BNDG CMPR 5X62 HK CLSR LF (GAUZE/BANDAGES/DRESSINGS) ×1
BNDG CMPR MED 10X6 ELC LF (GAUZE/BANDAGES/DRESSINGS) ×1
BNDG ELASTIC 6INX 5YD STR LF (GAUZE/BANDAGES/DRESSINGS) ×2 IMPLANT
BNDG ELASTIC 6X10 VLCR STRL LF (GAUZE/BANDAGES/DRESSINGS) IMPLANT
BOWL SMART MIX CTS (DISPOSABLE) ×2 IMPLANT
BSPLAT TIB 6 CMNT ROT PLAT STR (Knees) ×1 IMPLANT
CEMENT HV SMART SET (Cement) IMPLANT
COVER SURGICAL LIGHT HANDLE (MISCELLANEOUS) ×2 IMPLANT
CUFF TOURN SGL QUICK 34 (TOURNIQUET CUFF) ×1
CUFF TRNQT CYL 34X4.125X (TOURNIQUET CUFF) ×2 IMPLANT
DERMABOND ADVANCED .7 DNX12 (GAUZE/BANDAGES/DRESSINGS) ×2 IMPLANT
DRAPE U-SHAPE 47X51 STRL (DRAPES) ×2 IMPLANT
DRESSING AQUACEL AG SP 3.5X10 (GAUZE/BANDAGES/DRESSINGS) ×2 IMPLANT
DRSG AQUACEL AG ADV 3.5X10 (GAUZE/BANDAGES/DRESSINGS) IMPLANT
DRSG AQUACEL AG SP 3.5X10 (GAUZE/BANDAGES/DRESSINGS) ×1
DURAPREP 26ML APPLICATOR (WOUND CARE) ×4 IMPLANT
ELECT REM PT RETURN 15FT ADLT (MISCELLANEOUS) ×2 IMPLANT
GLOVE BIO SURGEON STRL SZ 6 (GLOVE) ×2 IMPLANT
GLOVE BIOGEL PI IND STRL 6.5 (GLOVE) ×2 IMPLANT
GLOVE BIOGEL PI IND STRL 7.5 (GLOVE) ×2 IMPLANT
GLOVE ORTHO TXT STRL SZ7.5 (GLOVE) ×4 IMPLANT
GOWN STRL REUS W/ TWL LRG LVL3 (GOWN DISPOSABLE) ×4 IMPLANT
GOWN STRL REUS W/TWL LRG LVL3 (GOWN DISPOSABLE) ×2
HANDPIECE INTERPULSE COAX TIP (DISPOSABLE) ×1
HOLDER FOLEY CATH W/STRAP (MISCELLANEOUS) IMPLANT
KIT TURNOVER KIT A (KITS) IMPLANT
MANIFOLD NEPTUNE II (INSTRUMENTS) ×2 IMPLANT
NDL SAFETY ECLIP 18X1.5 (MISCELLANEOUS) IMPLANT
NS IRRIG 1000ML POUR BTL (IV SOLUTION) ×2 IMPLANT
PACK TOTAL KNEE CUSTOM (KITS) ×2 IMPLANT
PIN FIX SIGMA LCS THRD HI (PIN) IMPLANT
PROTECTOR NERVE ULNAR (MISCELLANEOUS) ×2 IMPLANT
SET HNDPC FAN SPRY TIP SCT (DISPOSABLE) ×2 IMPLANT
SET PAD KNEE POSITIONER (MISCELLANEOUS) ×2 IMPLANT
SPIKE FLUID TRANSFER (MISCELLANEOUS) ×4 IMPLANT
SUT MNCRL AB 4-0 PS2 18 (SUTURE) ×2 IMPLANT
SUT STRATAFIX PDS+ 0 24IN (SUTURE) ×2 IMPLANT
SUT VIC AB 1 CT1 36 (SUTURE) ×2 IMPLANT
SUT VIC AB 2-0 CT1 27 (SUTURE) ×2
SUT VIC AB 2-0 CT1 TAPERPNT 27 (SUTURE) ×4 IMPLANT
SYR 3ML LL SCALE MARK (SYRINGE) ×2 IMPLANT
TIBIA ATTUNE KNEE SYS BASE SZ6 (Knees) ×1 IMPLANT
TOWEL GREEN STERILE FF (TOWEL DISPOSABLE) ×2 IMPLANT
TRAY FOLEY MTR SLVR 16FR STAT (SET/KITS/TRAYS/PACK) ×2 IMPLANT
TUBE SUCTION HIGH CAP CLEAR NV (SUCTIONS) ×2 IMPLANT
WATER STERILE IRR 1000ML POUR (IV SOLUTION) ×4 IMPLANT
WRAP KNEE MAXI GEL POST OP (GAUZE/BANDAGES/DRESSINGS) ×2 IMPLANT

## 2022-05-16 NOTE — Discharge Instructions (Signed)

## 2022-05-16 NOTE — Interval H&P Note (Signed)
History and Physical Interval Note:  05/16/2022 7:41 AM  Miranda Kelley  has presented today for surgery, with the diagnosis of Left knee Osteoarthritis.  The various methods of treatment have been discussed with the patient and family. After consideration of risks, benefits and other options for treatment, the patient has consented to  Procedure(s): TOTAL KNEE ARTHROPLASTY (Left) as a surgical intervention.  The patient's history has been reviewed, patient examined, no change in status, stable for surgery.  I have reviewed the patient's chart and labs.  Questions were answered to the patient's satisfaction.     Shelda Pal

## 2022-05-16 NOTE — Anesthesia Procedure Notes (Addendum)
Anesthesia Regional Block: Adductor canal block   Pre-Anesthetic Checklist: , timeout performed,  Correct Patient, Correct Site, Correct Laterality,  Correct Procedure, Correct Position, site marked,  Risks and benefits discussed,  Surgical consent,  Pre-op evaluation,  At surgeon's request and post-op pain management  Laterality: Left and Lower  Prep: chloraprep       Needles:  Injection technique: Single-shot      Needle Length: 9cm  Needle Gauge: 22     Additional Needles: Arrow StimuQuik ECHO Echogenic Stimulating PNB Needle  Procedures:,,,, ultrasound used (permanent image in chart),,    Narrative:  Start time: 05/16/2022 6:52 AM End time: 05/16/2022 6:58 AM Injection made incrementally with aspirations every 5 mL.  Performed by: Personally  Anesthesiologist: Val Eagle, MD

## 2022-05-16 NOTE — Anesthesia Procedure Notes (Signed)
Spinal  Patient location during procedure: OR Start time: 05/16/2022 7:19 AM End time: 05/16/2022 7:26 AM Reason for block: surgical anesthesia Staffing Performed: anesthesiologist  Anesthesiologist: Val Eagle, MD Performed by: Val Eagle, MD Authorized by: Val Eagle, MD   Preanesthetic Checklist Completed: patient identified, IV checked, risks and benefits discussed, surgical consent, monitors and equipment checked, pre-op evaluation and timeout performed Spinal Block Patient position: sitting Prep: DuraPrep Patient monitoring: heart rate, cardiac monitor, continuous pulse ox and blood pressure Approach: midline Location: L4-5 Injection technique: single-shot Needle Needle type: Pencan  Needle gauge: 24 G Needle length: 9 cm Assessment Sensory level: T6 Events: CSF return

## 2022-05-16 NOTE — Evaluation (Signed)
Physical Therapy Evaluation Patient Details Name: Miranda Kelley MRN: [id number] DOB: April 07, 1955 Today's Date: 05/16/2022  History of Present Illness  67 yo female presents to therapy s/p L TKA on 05/16/2022 due to failure of conservative measures. Pt has PMH including but not limited to: CAD, cholelithiasis, HTN, HIV, PVC, GERD, thyroid dz, chronic renal insufficiency, lap band, L ankle sx and R THA.  Clinical Impression    Miranda Kelley is a 67 y.o. female POD 0 s/p L TKA. Patient reports IND with mobility at baseline. Patient is now limited by functional impairments (see PT problem list below) and requires min guard and HOB elevated  for bed mobility and min guard and cues for transfers. Patient was able to ambulate 60 feet with RW and min guard level of assist intermittent standing rest breaks with trunk flexion. Patient instructed in exercise to facilitate ROM and circulation to manage edema. Patient will benefit from continued skilled PT interventions to address impairments and progress towards PLOF. Acute PT will follow to progress mobility and stair training in preparation for safe discharge home with family support and OPPT.      Recommendations for follow up therapy are one component of a multi-disciplinary discharge planning process, led by the attending physician.  Recommendations may be updated based on patient status, additional functional criteria and insurance authorization.  Follow Up Recommendations       Assistance Recommended at Discharge Intermittent Supervision/Assistance  Patient can return home with the following  A little help with walking and/or transfers;A little help with bathing/dressing/bathroom;Assistance with cooking/housework;Assist for transportation;Help with stairs or ramp for entrance    Equipment Recommendations Rolling walker (2 wheels)  Recommendations for Other Services       Functional Status Assessment Patient has had a recent decline in their functional  status and demonstrates the ability to make significant improvements in function in a reasonable and predictable amount of time.     Precautions / Restrictions Precautions Precautions: Knee;Fall Restrictions Weight Bearing Restrictions: No      Mobility  Bed Mobility Overal bed mobility: Needs Assistance Bed Mobility: Supine to Sit     Supine to sit: Min guard, HOB elevated     General bed mobility comments: min cues    Transfers Overall transfer level: Needs assistance Equipment used: Rolling walker (2 wheels) Transfers: Sit to/from Stand Sit to Stand: Min guard           General transfer comment: min cues for proper UE and AD placement    Ambulation/Gait Ambulation/Gait assistance: Min guard Gait Distance (Feet): 60 Feet (reports SOB with O2 saturation 94% and HR elevated to 140s) Assistive device: Rolling walker (2 wheels) Gait Pattern/deviations: Step-to pattern, Trunk flexed Gait velocity: decreased     General Gait Details: lateral sway  Stairs            Wheelchair Mobility    Modified Rankin (Stroke Patients Only)       Balance Overall balance assessment: Needs assistance, History of Falls Sitting-balance support: Feet supported Sitting balance-Leahy Scale: Good     Standing balance support: Bilateral upper extremity supported, During functional activity, Reliant on assistive device for balance Standing balance-Leahy Scale: Poor                               Pertinent Vitals/Pain Pain Assessment Pain Assessment: 0-10 Pain Score: 7  Pain Location: L knee Pain Descriptors / Indicators: Aching, Discomfort, Headache, Pressure, Operative site  guarding Pain Intervention(s): Limited activity within patient's tolerance, Monitored during session, Premedicated before session, Repositioned, Ice applied (no pain at rest and 7/10 with mobility)    Home Living Family/patient expects to be discharged to:: Private residence Living  Arrangements: Children Available Help at Discharge: Family Type of Home: House Home Access: Stairs to enter Entrance Stairs-Rails: None Entrance Stairs-Number of Steps: 1 Alternate Level Stairs-Number of Steps: 6 (6 hand rail on the R for the second set of steps) Home Layout: Multi-level;Bed/bath upstairs Home Equipment: Crutches      Prior Function Prior Level of Function : Independent/Modified Independent;Driving             Mobility Comments: IND with all ADLs, self care tasks, IADLs and driving       Hand Dominance        Extremity/Trunk Assessment        Lower Extremity Assessment Lower Extremity Assessment: LLE deficits/detail LLE Deficits / Details: L ankle sx, limited DF ROM, DF MMT 4/5, PF 5/5; SLR < 10 degree lag LLE Sensation: WNL    Cervical / Trunk Assessment Cervical / Trunk Assessment:  (wfl)  Communication   Communication: No difficulties  Cognition Arousal/Alertness: Awake/alert Behavior During Therapy: WFL for tasks assessed/performed Overall Cognitive Status: Within Functional Limits for tasks assessed                                          General Comments General comments (skin integrity, edema, etc.): 3 falls in the last 6 months    Exercises Total Joint Exercises Ankle Circles/Pumps: AROM, Both, 20 reps   Assessment/Plan    PT Assessment Patient needs continued PT services  PT Problem List Decreased strength;Decreased range of motion;Decreased activity tolerance;Decreased balance;Decreased mobility;Decreased coordination;Decreased cognition;Decreased knowledge of use of DME;Pain       PT Treatment Interventions DME instruction;Gait training;Stair training;Functional mobility training;Therapeutic activities;Therapeutic exercise;Balance training;Neuromuscular re-education;Patient/family education;Modalities    PT Goals (Current goals can be found in the Care Plan section)  Acute Rehab PT Goals Patient Stated  Goal: pt reports wanting to be able to walk normally without lateral sway PT Goal Formulation: With patient Time For Goal Achievement: 05/30/22 Potential to Achieve Goals: Good    Frequency 7X/week     Co-evaluation               AM-PAC PT "6 Clicks" Mobility  Outcome Measure Help needed turning from your back to your side while in a flat bed without using bedrails?: None Help needed moving from lying on your back to sitting on the side of a flat bed without using bedrails?: A Little Help needed moving to and from a bed to a chair (including a wheelchair)?: A Little Help needed standing up from a chair using your arms (e.g., wheelchair or bedside chair)?: A Little Help needed to walk in hospital room?: A Little Help needed climbing 3-5 steps with a railing? : A Lot 6 Click Score: 18    End of Session Equipment Utilized During Treatment: Gait belt Activity Tolerance: Patient limited by pain;Patient limited by fatigue Patient left: in chair;with call bell/phone within reach;with nursing/sitter in room Nurse Communication: Mobility status;Other (comment) (placement of ace bandage, elevated PR) PT Visit Diagnosis: Unsteadiness on feet (R26.81);Other abnormalities of gait and mobility (R26.89);Muscle weakness (generalized) (M62.81);History of falling (Z91.81);Pain;Difficulty in walking, not elsewhere classified (R26.2) Pain - Right/Left: Left Pain - part of  body: Knee    Time: 1610-9604 PT Time Calculation (min) (ACUTE ONLY): 39 min   Charges:   PT Evaluation $PT Eval Low Complexity: 1 Low PT Treatments $Gait Training: 8-22 mins $Therapeutic Activity: 8-22 mins        Johnny Bridge, PT Acute Rehab  Jacqualyn Posey 05/16/2022, 3:39 PM

## 2022-05-16 NOTE — Op Note (Signed)
NAME:  Miranda Kelley                      MEDICAL RECORD NO.:  [id number]                             FACILITY:  [facility]      PHYSICIAN:  Madlyn Frankel. Charlann Boxer, M.D.  DATE OF BIRTH:  12-18-1955      DATE OF PROCEDURE:  05/16/2022                                     OPERATIVE REPORT         PREOPERATIVE DIAGNOSIS:  Left knee osteoarthritis.      POSTOPERATIVE DIAGNOSIS:  Left knee osteoarthritis.      FINDINGS:  The patient was noted to have complete loss of cartilage and   bone-on-bone arthritis with associated osteophytes in all three compartments of   the knee with a significant synovitis and associated effusion.  The patient had failed months of conservative treatment including medications, injection therapy, activity modification.     PROCEDURE:  Left total knee replacement.      COMPONENTS USED:  DePuy Attune rotating platform posterior stabilized knee   system, a size 6N femur, 6 tibia, size 8 mm PS AOX insert, and 38 anatomic patellar   button.      SURGEON:  Madlyn Frankel. Charlann Boxer, M.D.      ASSISTANT:  Rosalene Billings, PA-C.      ANESTHESIA:  Regional and Spinal.      SPECIMENS:  None.      COMPLICATION:  None.      DRAINS:  None.  EBL: <200 cc      TOURNIQUET TIME:   Total Tourniquet Time Documented: Thigh (Left) - 34 minutes Total: Thigh (Left) - 34 minutes  .      The patient was stable to the recovery room.      INDICATION FOR PROCEDURE:  Miranda Kelley is a 67 y.o. female patient of   mine.  The patient had been seen, evaluated, and treated for months conservatively in the   office with medication, activity modification, and injections.  The patient had   radiographic changes of bone-on-bone arthritis with endplate sclerosis and osteophytes noted.  Based on the radiographic changes and failed conservative measures, the patient   decided to proceed with definitive treatment, total knee replacement.  Risks of infection, DVT, component failure, need for revision surgery,  neurovascular injury were reviewed in the office setting.  The postop course was reviewed stressing the efforts to maximize post-operative satisfaction and function.  Consent was obtained for benefit of pain   relief.      PROCEDURE IN DETAIL:  The patient was brought to the operative theater.   Once adequate anesthesia, preoperative antibiotics, 2 gm of Ancef,1 gm of Tranexamic Acid, and 10 mg of Decadron administered, the patient was positioned supine with a left thigh tourniquet placed.  The  left lower extremity was prepped and draped in sterile fashion.  A time-   out was performed identifying the patient, planned procedure, and the appropriate extremity.      The left lower extremity was placed in the [facility] leg holder.  The leg was   exsanguinated, tourniquet elevated to 225 mmHg.  A midline incision was   made followed by median  parapatellar arthrotomy.  Following initial   exposure, attention was first directed to the patella.  Precut   measurement was noted to be 22 mm.  I resected down to 13-14 mm and used a   38 anatomic patellar button to restore patellar height as well as cover the cut surface.      The lug holes were drilled and a metal shim was placed to protect the   patella from retractors and saw blade during the procedure.      At this point, attention was now directed to the femur.  The femoral   canal was opened with a drill, irrigated to try to prevent fat emboli.  An   intramedullary rod was passed at 3 degrees valgus, 9 mm of bone was   resected off the distal femur.  Following this resection, the tibia was   subluxated anteriorly.  Using the extramedullary guide, 2 mm of bone was resected off   the proximal medial tibia.  We confirmed the gap would be   stable medially and laterally with a size 6 spacer block as well as confirmed that the tibial cut was perpendicular in the coronal plane, checking with an alignment rod.      Once this was done, I sized the femur to  be a size 6 in the anterior-   posterior dimension, chose a narrow component based on medial and   lateral dimension.  The size 6 rotation block was then pinned in   position anterior referenced using the C-clamp to set rotation.  The   anterior, posterior, and  chamfer cuts were made without difficulty nor   notching making certain that I was along the anterior cortex to help   with flexion gap stability.      The final box cut was made off the lateral aspect of distal femur.      At this point, the tibia was sized to be a size 6.  The size 6 tray was   then pinned in position through the medial third of the tubercle,   drilled, and keel punched.  Trial reduction was now carried with a 6 femur,  6 tibia, a size 8 mm PS insert, and the 38 anatomic patella botton.  The knee was brought to full extension with good flexion stability with the patella   tracking through the trochlea without application of pressure.  Given   all these findings the trial components removed.  Final components were   opened and cement was mixed.  The knee was irrigated with normal saline solution and pulse lavage.  The synovial lining was   then injected with 30 cc of 0.25% Marcaine with epinephrine, 1 cc of Toradol and 30 cc of NS for a total of 61 cc.     Final implants were then cemented onto cleaned and dried cut surfaces of bone with the knee brought to extension with a size 8 mm PS trial insert.      Once the cement had fully cured, excess cement was removed   throughout the knee.  I confirmed that I was satisfied with the range of   motion and stability, and the final size 8 mm PS AOX insert was chosen.  It was   placed into the knee.      The tourniquet had been let down at 34 minutes.  No significant   hemostasis was required.  The extensor mechanism was then reapproximated using #1 Vicryl and #1 Stratafix sutures  with the knee   in flexion.  The   remaining wound was closed with 2-0 Vicryl and running  4-0 Monocryl.   The knee was cleaned, dried, dressed sterilely using Dermabond and   Aquacel dressing.  The patient was then   brought to recovery room in stable condition, tolerating the procedure   well.   Please note that Physician Assistant, Rosalene Billings, PA-C was present for the entirety of the case, and was utilized for pre-operative positioning, peri-operative retractor management, general facilitation of the procedure and for primary wound closure at the end of the case.              Madlyn Frankel Charlann Boxer, M.D.    05/16/2022 8:43 AM

## 2022-05-16 NOTE — Transfer of Care (Signed)
Immediate Anesthesia Transfer of Care Note  Patient: Miranda Kelley  Procedure(s) Performed: TOTAL KNEE ARTHROPLASTY (Left: Knee)  Patient Location: PACU  Anesthesia Type:Spinal  Level of Consciousness: awake, alert , and oriented  Airway & Oxygen Therapy: Patient Spontanous Breathing and Patient connected to face mask oxygen  Post-op Assessment: Report given to RN and Post -op Vital signs reviewed and stable  Post vital signs: Reviewed and stable  Last Vitals:  Vitals Value Taken Time  BP 128/72   Temp    Pulse 60 05/16/22 0914  Resp 12   SpO2 95 % 05/16/22 0914  Vitals shown include unvalidated device data.  Last Pain:  Vitals:   05/16/22 0551  TempSrc:   PainSc: 8       Patients Stated Pain Goal: 5 (05/16/22 0551)  Complications: No notable events documented.

## 2022-05-16 NOTE — Plan of Care (Signed)
  Problem: Education: Goal: Knowledge of the prescribed therapeutic regimen will improve Outcome: Progressing   Problem: Activity: Goal: Ability to avoid complications of mobility impairment will improve Outcome: Progressing Goal: Range of joint motion will improve Outcome: Progressing   Problem: Pain Management: Goal: Pain level will decrease with appropriate interventions Outcome: Progressing   Problem: Nutrition: Goal: Adequate nutrition will be maintained Outcome: Progressing   

## 2022-05-17 ENCOUNTER — Encounter (HOSPITAL_COMMUNITY): Payer: Self-pay | Admitting: Orthopedic Surgery

## 2022-05-17 DIAGNOSIS — M1712 Unilateral primary osteoarthritis, left knee: Secondary | ICD-10-CM | POA: Diagnosis not present

## 2022-05-17 LAB — CBC
HCT: 30.9 % — ABNORMAL LOW (ref 36.0–46.0)
Hemoglobin: 9.4 g/dL — ABNORMAL LOW (ref 12.0–15.0)
MCH: 29.5 pg (ref 26.0–34.0)
MCHC: 30.4 g/dL (ref 30.0–36.0)
MCV: 96.9 fL (ref 80.0–100.0)
Platelets: 191 10*3/uL (ref 150–400)
RBC: 3.19 MIL/uL — ABNORMAL LOW (ref 3.87–5.11)
RDW: 16.4 % — ABNORMAL HIGH (ref 11.5–15.5)
WBC: 12.5 10*3/uL — ABNORMAL HIGH (ref 4.0–10.5)
nRBC: 0 % (ref 0.0–0.2)

## 2022-05-17 LAB — BASIC METABOLIC PANEL
Anion gap: 7 (ref 5–15)
BUN: 15 mg/dL (ref 8–23)
CO2: 24 mmol/L (ref 22–32)
Calcium: 8.6 mg/dL — ABNORMAL LOW (ref 8.9–10.3)
Chloride: 104 mmol/L (ref 98–111)
Creatinine, Ser: 0.69 mg/dL (ref 0.44–1.00)
GFR, Estimated: >60 mL/min (ref >60)
Glucose, Bld: 158 mg/dL — ABNORMAL HIGH (ref 70–99)
Potassium: 3.5 mmol/L (ref 3.5–5.1)
Sodium: 135 mmol/L (ref 135–145)

## 2022-05-17 MED ORDER — CELECOXIB 200 MG PO CAPS: 200.0000 mg | ORAL_CAPSULE | Freq: Every day | ORAL | 0 refills | Status: AC

## 2022-05-17 MED ORDER — TIZANIDINE HCL 4 MG PO TABS: 4.0000 mg | ORAL_TABLET | Freq: Three times a day (TID) | ORAL | 0 refills | Status: AC | PRN

## 2022-05-17 MED ORDER — POLYETHYLENE GLYCOL 3350 17 G PO PACK: 17.0000 g | PACK | Freq: Two times a day (BID) | ORAL | 0 refills | Status: DC

## 2022-05-17 MED ORDER — NALOXONE HCL 4 MG/0.1ML NA LIQD: NASAL | 0 refills | Status: AC

## 2022-05-17 MED ORDER — ASPIRIN 81 MG PO CHEW: 81.0000 mg | CHEWABLE_TABLET | Freq: Two times a day (BID) | ORAL | 0 refills | Status: AC

## 2022-05-17 MED ORDER — ACETAMINOPHEN 500 MG PO TABS: 1000.0000 mg | ORAL_TABLET | Freq: Four times a day (QID) | ORAL | 0 refills | Status: AC

## 2022-05-17 MED ORDER — OXYCODONE HCL 10 MG PO TABS: 15.0000 mg | ORAL_TABLET | ORAL | 0 refills | Status: DC | PRN

## 2022-05-17 MED ORDER — SENNA 8.6 MG PO TABS: 2.0000 | ORAL_TABLET | Freq: Every day | ORAL | 0 refills | Status: AC

## 2022-05-17 MED ORDER — CEFADROXIL 500 MG PO CAPS: 500.0000 mg | ORAL_CAPSULE | Freq: Two times a day (BID) | ORAL | 0 refills | Status: AC

## 2022-05-17 NOTE — TOC Transition Note (Signed)
Transition of Care [facility]) - CM/SW Discharge Note   Patient Details  Name: Miranda Kelley MRN: [id number] Date of Birth: March 01, 1955  [facility] CM/SW Contact:  Amada Jupiter, LCSW Phone Number: 05/17/2022, 10:28 AM   Clinical Narrative:     Met with pt and confirming need for RW and no DME agency preference - order placed with [employer] for delivery to room.  OPPT already arranged with [facility].  No further TOC needs.  Final next level of care: OP Rehab Barriers to Discharge: No Barriers Identified   Patient Goals and CMS Choice      Discharge Placement                         Discharge Plan and Services Additional resources added to the After Visit Summary for                  DME Arranged: Walker rolling DME Agency: [employer] Date DME Agency Contacted: 05/17/22 Time DME Agency Contacted: 1028 Representative spoke with at DME Agency: Yvonna Alanis            Social Determinants of Health (SDOH) Interventions SDOH Screenings   Food Insecurity: No Food Insecurity (05/16/2022)  Housing: Low Risk  (05/16/2022)  Transportation Needs: No Transportation Needs (05/16/2022)  Utilities: Not At Risk (05/16/2022)  Depression (PHQ2-9): Low Risk  (01/31/2022)  Tobacco Use: Low Risk  (05/17/2022)     Readmission Risk Interventions     No data to display

## 2022-05-17 NOTE — Progress Notes (Signed)
The patient is alert and oriented and has been seen by her physician. The orders for discharge were written. IV has been removed. Aquacel dressing is clean, dry, and intact. Went over discharge instructions with patient and family. She is being discharged via wheelchair with all of her belongings.  

## 2022-05-17 NOTE — Progress Notes (Signed)
Physical Therapy Treatment Patient Details Name: Miranda Kelley MRN: [id number] DOB: July 21, 1955 Today's Date: 05/17/2022   History of Present Illness 67 yo female presents to therapy s/p L TKA on 05/16/2022 due to failure of conservative measures. Pt has PMH including but not limited to: CAD, cholelithiasis, HTN, HIV, PVC, GERD, thyroid dz, chronic renal insufficiency, lap band, L ankle sx and R THA.    PT Comments    POD # 1 pm session Assisted OOB to amb in hallway then practice stairs.  Then returned to room to perform some TE's following HEP handout.  Instructed on proper tech, freq as well as use of ICE.   Addressed all mobility questions, discussed appropriate activity, educated on use of ICE.  Pt ready for D/C to home with Son.   Recommendations for follow up therapy are one component of a multi-disciplinary discharge planning process, led by the attending physician.  Recommendations may be updated based on patient status, additional functional criteria and insurance authorization.  Follow Up Recommendations       Assistance Recommended at Discharge Intermittent Supervision/Assistance  Patient can return home with the following A little help with walking and/or transfers;A little help with bathing/dressing/bathroom;Assistance with cooking/housework;Assist for transportation;Help with stairs or ramp for entrance   Equipment Recommendations  Rolling walker (2 wheels)    Recommendations for Other Services       Precautions / Restrictions Precautions Precautions: Knee;Fall Precaution Comments: instructed no pillow under knee Restrictions Weight Bearing Restrictions: No Other Position/Activity Restrictions: WBAT     Mobility  Bed Mobility Overal bed mobility: Needs Assistance Bed Mobility: Supine to Sit     Supine to sit: Supervision, Min guard     General bed mobility comments: demonstarted and instructed how to use belt to self assist    Transfers Overall transfer  level: Needs assistance Equipment used: Rolling walker (2 wheels) Transfers: Sit to/from Stand             General transfer comment: good safety cognition and use of hands to steady self    Ambulation/Gait Ambulation/Gait assistance: Supervision, Min guard Gait Distance (Feet): 32 Feet Assistive device: Rolling walker (2 wheels) Gait Pattern/deviations: Step-to pattern, Trunk flexed Gait velocity: decreased     General Gait Details: tolerated a functional distance in hallway   Stairs Stairs: Yes Stairs assistance: Supervision, Min guard Stair Management: One rail Right, Step to pattern, Forwards Number of Stairs: 2 General stair comments: 25% VC's on proper sequencing as well as safety   Wheelchair Mobility    Modified Rankin (Stroke Patients Only)       Balance                                            Cognition Arousal/Alertness: Awake/alert   Overall Cognitive Status: Within Functional Limits for tasks assessed                                 General Comments: AxO x 3 very pleasant and motivated.  Had "other" knee done "years ago".        Exercises   Total Knee Replacement TE's following HEP handout 10 reps B LE ankle pumps 05 reps towel squeezes 05 reps knee presses 05 reps heel slides  05 reps SAQ's 05 reps SLR's 05 reps ABD Educated on use of  gait belt to assist with TE's Followed by ICE    General Comments        Pertinent Vitals/Pain Pain Assessment Pain Assessment: 0-10 Pain Score: 5  Pain Location: L knee Pain Descriptors / Indicators: Aching, Discomfort, Operative site guarding, Sore Pain Intervention(s): Monitored during session, Premedicated before session, Repositioned, Ice applied    Home Living                          Prior Function            PT Goals (current goals can now be found in the care plan section) Progress towards PT goals: Progressing toward goals     Frequency    7X/week      PT Plan Current plan remains appropriate    Co-evaluation              AM-PAC PT "6 Clicks" Mobility   Outcome Measure  Help needed turning from your back to your side while in a flat bed without using bedrails?: A Little Help needed moving from lying on your back to sitting on the side of a flat bed without using bedrails?: A Little Help needed moving to and from a bed to a chair (including a wheelchair)?: A Little Help needed standing up from a chair using your arms (e.g., wheelchair or bedside chair)?: A Little Help needed to walk in hospital room?: A Little Help needed climbing 3-5 steps with a railing? : A Lot 6 Click Score: 17    End of Session Equipment Utilized During Treatment: Gait belt Activity Tolerance: Patient limited by pain;Patient limited by fatigue Patient left: in chair;with call bell/phone within reach;with nursing/sitter in room Nurse Communication: Mobility status PT Visit Diagnosis: Unsteadiness on feet (R26.81);Other abnormalities of gait and mobility (R26.89);Muscle weakness (generalized) (M62.81);History of falling (Z91.81);Pain;Difficulty in walking, not elsewhere classified (R26.2) Pain - Right/Left: Left Pain - part of body: Knee     Time: 1322-1350 PT Time Calculation (min) (ACUTE ONLY): 28 min  Charges:  $Gait Training: 8-22 mins $Therapeutic Exercise: 8-22 mins                    Felecia Shelling  PTA Acute  Rehabilitation Services Office M-F          [phone]

## 2022-05-17 NOTE — Progress Notes (Signed)
Physical Therapy Treatment Patient Details Name: Miranda Kelley MRN: [id number] DOB: 22-Aug-1955 Today's Date: 05/17/2022   History of Present Illness 67 yo female presents to therapy s/p L TKA on 05/16/2022 due to failure of conservative measures. Pt has PMH including but not limited to: CAD, cholelithiasis, HTN, HIV, PVC, GERD, thyroid dz, chronic renal insufficiency, lap band, L ankle sx and R THA.    PT Comments    POD # 1 am session General Comments: AxO x 3 very pleasant and motivated.  Had "other" knee done "years ago".  Assisted OOB to amb to bathroom.  General bed mobility comments: demonstarted and instructed how to use belt to self assist  General transfer comment: good safety cognition and use of hands to steady self.  Also assisted with a toilet transfer.  Pt able to self perform peri care while standing at Supervision level.  Required a seated rest break after.  Then assisted with amb in hallway.  General transfer comment: good safety cognition and use of hands to steady self.  Returned to room in recliner.  Applied ICE.  Will see pt again this afternoon for mobility and TE's.   Pt plans to D/C to home today.   Recommendations for follow up therapy are one component of a multi-disciplinary discharge planning process, led by the attending physician.  Recommendations may be updated based on patient status, additional functional criteria and insurance authorization.  Follow Up Recommendations       Assistance Recommended at Discharge Intermittent Supervision/Assistance  Patient can return home with the following A little help with walking and/or transfers;A little help with bathing/dressing/bathroom;Assistance with cooking/housework;Assist for transportation;Help with stairs or ramp for entrance   Equipment Recommendations  Rolling walker (2 wheels)    Recommendations for Other Services       Precautions / Restrictions Precautions Precautions: Knee;Fall Precaution Comments:  instructed no pillow under knee Restrictions Weight Bearing Restrictions: No Other Position/Activity Restrictions: WBAT     Mobility  Bed Mobility Overal bed mobility: Needs Assistance Bed Mobility: Supine to Sit     Supine to sit: Supervision, Min guard     General bed mobility comments: demonstarted and instructed how to use belt to self assist    Transfers Overall transfer level: Needs assistance Equipment used: Rolling walker (2 wheels) Transfers: Sit to/from Stand             General transfer comment: good safety cognition and use of hands to steady self    Ambulation/Gait Ambulation/Gait assistance: Supervision, Min guard Gait Distance (Feet): 42 Feet Assistive device: Rolling walker (2 wheels) Gait Pattern/deviations: Step-to pattern, Trunk flexed Gait velocity: decreased     General transfer comment: good safety cognition and use of hands to steady self   Stairs             Wheelchair Mobility    Modified Rankin (Stroke Patients Only)       Balance                                            Cognition Arousal/Alertness: Awake/alert   Overall Cognitive Status: Within Functional Limits for tasks assessed                                 General Comments: AxO x 3 very pleasant and motivated.  Had "other" knee done "years ago".        Exercises      General Comments        Pertinent Vitals/Pain Pain Assessment Pain Assessment: 0-10 Pain Score: 5  Pain Location: L knee Pain Descriptors / Indicators: Aching, Discomfort, Operative site guarding, Sore Pain Intervention(s): Monitored during session, Premedicated before session, Repositioned, Ice applied    Home Living                          Prior Function            PT Goals (current goals can now be found in the care plan section) Progress towards PT goals: Progressing toward goals    Frequency    7X/week      PT Plan  Current plan remains appropriate    Co-evaluation              AM-PAC PT "6 Clicks" Mobility   Outcome Measure  Help needed turning from your back to your side while in a flat bed without using bedrails?: A Little Help needed moving from lying on your back to sitting on the side of a flat bed without using bedrails?: A Little Help needed moving to and from a bed to a chair (including a wheelchair)?: A Little Help needed standing up from a chair using your arms (e.g., wheelchair or bedside chair)?: A Little Help needed to walk in hospital room?: A Little Help needed climbing 3-5 steps with a railing? : A Lot 6 Click Score: 17    End of Session Equipment Utilized During Treatment: Gait belt Activity Tolerance: Patient limited by pain;Patient limited by fatigue Patient left: in chair;with call bell/phone within reach;with nursing/sitter in room Nurse Communication: Mobility status PT Visit Diagnosis: Unsteadiness on feet (R26.81);Other abnormalities of gait and mobility (R26.89);Muscle weakness (generalized) (M62.81);History of falling (Z91.81);Pain;Difficulty in walking, not elsewhere classified (R26.2) Pain - Right/Left: Left Pain - part of body: Knee     Time: 3086-5784 PT Time Calculation (min) (ACUTE ONLY): 25 min  Charges:  $Gait Training: 8-22 mins $Therapeutic Activity: 8-22 mins                     Felecia Shelling  PTA Acute  Rehabilitation Services Office M-F          [phone]

## 2022-05-17 NOTE — Plan of Care (Signed)
  Problem: Education: Goal: Knowledge of General Education information will improve Description Including pain rating scale, medication(s)/side effects and non-pharmacologic comfort measures Outcome: Progressing   

## 2022-05-17 NOTE — Progress Notes (Signed)
Subjective: 1 Day Post-Op Procedure(s) (LRB): TOTAL KNEE ARTHROPLASTY (Left) Patient reports pain as mild.   Patient seen in rounds with Dr. Charlann Boxer. Patient is well, and has had no acute complaints or problems. No acute events overnight. Foley catheter removed. Patient ambulated 60 feet with PT.  We will continue therapy today.   Objective: Vital signs in last 24 hours: Temp:  [97.4 F (36.3 C)-98.6 F (37 C)] 98 F (36.7 C) (05/15 0628) Pulse Rate:  [41-76] 60 (05/15 0628) Resp:  [10-18] 18 (05/15 0628) BP: (117-146)/(61-100) 126/80 (05/15 0628) SpO2:  [91 %-100 %] 99 % (05/15 0628)  Intake/Output from previous day:  Intake/Output Summary (Last 24 hours) at 05/17/2022 0812 Last data filed at 05/17/2022 9604 Gross per 24 hour  Intake 3658.62 ml  Output 2200 ml  Net 1458.62 ml     Intake/Output this shift: No intake/output data recorded.  Labs: Recent Labs    05/17/22 0333  HGB 9.4*   Recent Labs    05/17/22 0333  WBC 12.5*  RBC 3.19*  HCT 30.9*  PLT 191   Recent Labs    05/17/22 0333  NA 135  K 3.5  CL 104  CO2 24  BUN 15  CREATININE 0.69  GLUCOSE 158*  CALCIUM 8.6*   No results for input(s): "LABPT", "INR" in the last 72 hours.  Exam: General - Patient is Alert and Oriented Extremity - Neurologically intact Sensation intact distally Intact pulses distally Dorsiflexion/Plantar flexion intact Dressing - dressing C/D/I Motor Function - intact, moving foot and toes well on exam.   Past Medical History:  Diagnosis Date   Anxiety    Arthritis    knees   Cataract    surgery - bilateral   Chronic sinus infection    Dyspnea    HIV infection (HCC)    Hypertension    hx - no longer a problem - weight loss surgery -lost 100lbs   Hypothyroidism    Pneumonia    Thyroid disease     Assessment/Plan: 1 Day Post-Op Procedure(s) (LRB): TOTAL KNEE ARTHROPLASTY (Left) Principal Problem:   S/P total knee arthroplasty, left  Estimated body mass  index is 41.6 kg/m as calculated from the following:   Height as of this encounter: 5\' 5"  (1.651 m).   Weight as of this encounter: 113.4 kg. Advance diet Up with therapy D/C IV fluids   Patient's anticipated LOS is less than 2 midnights, meeting these requirements: - Younger than 92 - Lives within 1 hour of care - Has a competent adult at home to recover with post-op recover - NO history of  - Chronic pain requiring opiods  - Diabetes  - Coronary Artery Disease  - Heart failure  - Heart attack  - Stroke  - DVT/VTE  - Cardiac arrhythmia  - Respiratory Failure/COPD  - Renal failure  - Anemia  - Advanced Liver disease     DVT Prophylaxis - Aspirin Weight bearing as tolerated.  Hgb stable at 9.4 this AM.  Will go home on abx for 10 days due to BMI >40 as well as underlying chronic illnesses.   Patient on chronic pain meds at baseline - will require oxycodone 15 mg q4h post-op -- will send with narcan to use if needed  Plan is to go Home after hospital stay. Plan for discharge today following 1-2 sessions of PT as long as they are meeting their goals. Patient is scheduled for OPPT. Follow up in the office in 2 weeks.  Dennie Bible, PA-C Orthopedic Surgery [phone] 05/17/2022, 8:12 AM

## 2022-05-19 ENCOUNTER — Encounter (HOSPITAL_COMMUNITY): Payer: Self-pay | Admitting: Orthopedic Surgery

## 2022-05-19 NOTE — Anesthesia Postprocedure Evaluation (Signed)
Anesthesia Post Note  Patient: Miranda Kelley  Procedure(s) Performed: TOTAL KNEE ARTHROPLASTY (Left: Knee)     Patient location during evaluation: PACU Anesthesia Type: Regional, Spinal and MAC Level of consciousness: awake and alert Pain management: pain level controlled Vital Signs Assessment: post-procedure vital signs reviewed and stable Respiratory status: spontaneous breathing, nonlabored ventilation and respiratory function stable Cardiovascular status: stable and blood pressure returned to baseline Postop Assessment: no apparent nausea or vomiting Anesthetic complications: no   No notable events documented.  Last Vitals:  Vitals:   05/17/22 0934 05/17/22 1400  BP: (!) 146/82 (!) 156/80  Pulse: 67 (!) 41  Resp: 16 16  Temp: 36.8 C 37 C  SpO2: 100% 100%    Last Pain:  Vitals:   05/17/22 1519  TempSrc:   PainSc: 4                  Chene Kasinger

## 2022-05-25 NOTE — Discharge Summary (Signed)
Patient ID: Miranda Kelley MRN: [id number] DOB/AGE: September 21, 1955 67 y.o.  Admit date: 05/16/2022 Discharge date: 05/17/2022  Admission Diagnoses:  Left knee osteoarthritis  Discharge Diagnoses:  Principal Problem:   S/P total knee arthroplasty, left   Past Medical History:  Diagnosis Date   Anxiety    Arthritis    knees   Cataract    surgery - bilateral   Chronic sinus infection    Dyspnea    HIV infection (HCC)    Hypertension    hx - no longer a problem - weight loss surgery -lost 100lbs   Hypothyroidism    Pneumonia    Thyroid disease     Surgeries: Procedure(s): TOTAL KNEE ARTHROPLASTY on 05/16/2022   Consultants:   Discharged Condition: Improved  Hospital Course: Miranda Kelley is an 67 y.o. female who was admitted 05/16/2022 for operative treatment ofS/P total knee arthroplasty, left. Patient has severe unremitting pain that affects sleep, daily activities, and work/hobbies. After pre-op clearance the patient was taken to the operating room on 05/16/2022 and underwent  Procedure(s): TOTAL KNEE ARTHROPLASTY.    Patient was given perioperative antibiotics:  Anti-infectives (From admission, onward)    Start     Dose/Rate Route Frequency Ordered Stop   05/17/22 1000  elvitegravir-cobicistat-emtricitabine-tenofovir (GENVOYA) 150-150-200-10 MG tablet 1 tablet  Status:  Discontinued        1 tablet Oral Daily 05/16/22 1322 05/17/22 2029   05/17/22 0000  cefadroxil (DURICEF) 500 MG capsule        500 mg Oral 2 times daily 05/17/22 0816 05/27/22 2359   05/16/22 1415  ceFAZolin (ANCEF) IVPB 2g/100 mL premix        2 g 200 mL/hr over 30 Minutes Intravenous Every 6 hours 05/16/22 1322 05/17/22 1010   05/16/22 0600  ceFAZolin (ANCEF) IVPB 2g/100 mL premix        2 g 200 mL/hr over 30 Minutes Intravenous On call to O.R. 05/16/22 0533 05/16/22 0757   05/16/22 0547  ceFAZolin (ANCEF) 2-4 GM/100ML-% IVPB       Note to Pharmacy: Vevelyn Royals D: cabinet override      05/16/22  0547 05/16/22 0732        Patient was given sequential compression devices, early ambulation, and chemoprophylaxis to prevent DVT. Patient worked with PT and was meeting their goals regarding safe ambulation and transfers.  Patient benefited maximally from hospital stay and there were no complications.    Recent vital signs: No data found.   Recent laboratory studies: No results for input(s): "WBC", "HGB", "HCT", "PLT", "NA", "K", "CL", "CO2", "BUN", "CREATININE", "GLUCOSE", "INR", "CALCIUM" in the last 72 hours.  Invalid input(s): "PT", "2"   Discharge Medications:   Allergies as of 05/17/2022   No Known Allergies      Medication List     STOP taking these medications    aspirin EC 81 MG tablet Replaced by: aspirin 81 MG chewable tablet   cyclobenzaprine 10 MG tablet Commonly known as: FLEXERIL   diclofenac 75 MG EC tablet Commonly known as: VOLTAREN   oxyCODONE-acetaminophen 10-325 MG tablet Commonly known as: Percocet       TAKE these medications    acetaminophen 500 MG tablet Commonly known as: TYLENOL Take 2 tablets (1,000 mg total) by mouth every 6 (six) hours.   albuterol 108 (90 Base) MCG/ACT inhaler Commonly known as: VENTOLIN HFA Inhale 2 puffs into the lungs every 6 (six) hours as needed for wheezing or shortness of breath.   alendronate 70 MG  tablet Commonly known as: FOSAMAX Take 70 mg by mouth once a week.   ALPRAZolam 1 MG tablet Commonly known as: XANAX Take 1 mg by mouth 3 (three) times daily as needed for anxiety.   aspirin 81 MG chewable tablet Chew 1 tablet (81 mg total) by mouth 2 (two) times daily for 28 days. Replaces: aspirin EC 81 MG tablet   bisacodyl 5 MG EC tablet Commonly known as: DULCOLAX Take 5 mg by mouth daily as needed for moderate constipation. Dulcolax 5 mg tab take as directed for colonoscopy prep.   cefadroxil 500 MG capsule Commonly known as: DURICEF Take 1 capsule (500 mg total) by mouth 2 (two) times daily  for 10 days.   celecoxib 200 MG capsule Commonly known as: CELEBREX Take 1 capsule (200 mg total) by mouth daily.   clobetasol ointment 0.05 % Commonly known as: TEMOVATE Apply 1 Application topically 2 (two) times daily.   cyanocobalamin 1000 MCG/ML injection Commonly known as: VITAMIN B12 Inject 1,000 mcg into the muscle every 30 (thirty) days.   diclofenac Sodium 1 % Gel Commonly known as: VOLTAREN Apply 2 g topically daily as needed (pain).   Genvoya 150-150-200-10 MG Tabs tablet Generic drug: elvitegravir-cobicistat-emtricitabine-tenofovir Take 1 tablet by mouth daily.   Genvoya 150-150-200-10 MG Tabs tablet Generic drug: elvitegravir-cobicistat-emtricitabine-tenofovir Take 1 tablet by mouth daily with breakfast.   levothyroxine 150 MCG tablet Commonly known as: SYNTHROID Take 150 mcg by mouth daily before breakfast.   naloxone 4 MG/0.1ML Liqd nasal spray kit Commonly known as: NARCAN Spray into nostril with signs of opioid related oversedation or overdose   nystatin ointment Commonly known as: MYCOSTATIN 1 Application daily as needed (yeast).   Oxycodone HCl 10 MG Tabs Take 1.5 tablets (15 mg total) by mouth every 4 (four) hours as needed for severe pain.   polyethylene glycol 17 g packet Commonly known as: MIRALAX / GLYCOLAX Take 17 g by mouth 2 (two) times daily.   rosuvastatin 40 MG tablet Commonly known as: CRESTOR Take 1 tablet (40 mg total) by mouth daily.   senna 8.6 MG Tabs tablet Commonly known as: SENOKOT Take 2 tablets (17.2 mg total) by mouth at bedtime for 14 days.   tiZANidine 4 MG tablet Commonly known as: ZANAFLEX Take 1 tablet (4 mg total) by mouth every 8 (eight) hours as needed for muscle spasms. Do not take cyclobenzaprine while on this medication   triamcinolone cream 0.1 % Commonly known as: KENALOG Apply 1 Application topically daily as needed (irritation).   Vitamin D3 50 MCG (2000 UT) Tabs Take 2,000 Units by mouth daily.                Discharge Care Instructions  (From admission, onward)           Start     Ordered   05/17/22 0000  Change dressing       Comments: Maintain surgical dressing until follow up in the clinic. If the edges start to pull up, may reinforce with tape. If the dressing is no longer working, may remove and cover with gauze and tape, but must keep the area dry and clean.  Call with any questions or concerns.   05/17/22 0815            Diagnostic Studies: No results found.  Disposition: Discharge disposition: 01-Home or Self Care       Discharge Instructions     Call MD / Call 911   Complete by: As directed  If you experience chest pain or shortness of breath, CALL 911 and be transported to the hospital emergency room.  If you develope a fever above 101 F, pus (white drainage) or increased drainage or redness at the wound, or calf pain, call your surgeon's office.   Change dressing   Complete by: As directed    Maintain surgical dressing until follow up in the clinic. If the edges start to pull up, may reinforce with tape. If the dressing is no longer working, may remove and cover with gauze and tape, but must keep the area dry and clean.  Call with any questions or concerns.   Constipation Prevention   Complete by: As directed    Drink plenty of fluids.  Prune juice may be helpful.  You may use a stool softener, such as Colace (over the counter) 100 mg twice a day.  Use MiraLax (over the counter) for constipation as needed.   Diet - low sodium heart healthy   Complete by: As directed    Increase activity slowly as tolerated   Complete by: As directed    Weight bearing as tolerated with assist device (walker, cane, etc) as directed, use it as long as suggested by your surgeon or therapist, typically at least 4-6 weeks.   Post-operative opioid taper instructions:   Complete by: As directed    POST-OPERATIVE OPIOID TAPER INSTRUCTIONS: It is important to wean  off of your opioid medication as soon as possible. If you do not need pain medication after your surgery it is ok to stop day one. Opioids include: Codeine, Hydrocodone(Norco, Vicodin), Oxycodone(Percocet, oxycontin) and hydromorphone amongst others.  Long term and even short term use of opiods can cause: Increased pain response Dependence Constipation Depression Respiratory depression And more.  Withdrawal symptoms can include Flu like symptoms Nausea, vomiting And more Techniques to manage these symptoms Hydrate well Eat regular healthy meals Stay active Use relaxation techniques(deep breathing, meditating, yoga) Do Not substitute Alcohol to help with tapering If you have been on opioids for less than two weeks and do not have pain than it is ok to stop all together.  Plan to wean off of opioids This plan should start within one week post op of your joint replacement. Maintain the same interval or time between taking each dose and first decrease the dose.  Cut the total daily intake of opioids by one tablet each day Next start to increase the time between doses. The last dose that should be eliminated is the evening dose.      TED hose   Complete by: As directed    Use stockings (TED hose) for 2 weeks on both leg(s).  You may remove them at night for sleeping.        Follow-up Information     Durene Romans, MD. Schedule an appointment as soon as possible for a visit in 2 week(s).   Specialty: Orthopedic Surgery Contact information: [address] [place] [address] [phone]                  Signed: Cassandria Anger 05/25/2022, 9:42 AM

## 2022-05-26 ENCOUNTER — Other Ambulatory Visit (HOSPITAL_COMMUNITY): Payer: Self-pay

## 2022-05-31 ENCOUNTER — Other Ambulatory Visit: Payer: Self-pay

## 2022-05-31 ENCOUNTER — Other Ambulatory Visit: Payer: Self-pay | Admitting: Internal Medicine

## 2022-05-31 DIAGNOSIS — B20 Human immunodeficiency virus [HIV] disease: Secondary | ICD-10-CM

## 2022-06-01 ENCOUNTER — Other Ambulatory Visit (HOSPITAL_COMMUNITY): Payer: Self-pay

## 2022-06-08 ENCOUNTER — Other Ambulatory Visit (HOSPITAL_COMMUNITY): Payer: Self-pay

## 2022-06-19 ENCOUNTER — Other Ambulatory Visit (HOSPITAL_BASED_OUTPATIENT_CLINIC_OR_DEPARTMENT_OTHER): Payer: Self-pay

## 2022-06-19 MED ORDER — OXYCODONE-ACETAMINOPHEN 10-325 MG PO TABS
1.0000 | ORAL_TABLET | ORAL | 0 refills | Status: DC
  Filled 2022-06-19: qty 120, 20d supply, fill #0

## 2022-07-18 ENCOUNTER — Other Ambulatory Visit (HOSPITAL_BASED_OUTPATIENT_CLINIC_OR_DEPARTMENT_OTHER): Payer: Self-pay

## 2022-07-18 MED ORDER — OXYCODONE-ACETAMINOPHEN 10-325 MG PO TABS
1.0000 | ORAL_TABLET | ORAL | 0 refills | Status: DC | PRN
  Filled 2022-07-18: qty 120, 20d supply, fill #0

## 2022-07-20 ENCOUNTER — Telehealth: Payer: Self-pay

## 2022-07-20 NOTE — Telephone Encounter (Signed)
Received call from patient, she states she has been experiencing increased bruising. Says she saw her PCP Dr. Pecola Leisure who wanted to make sure that it wasn't related to her HIV.   Miranda Kelley states she is on blood thinners: aspirin and something that starts with an r (potentially rivaroxaban). These are being prescribed by her cardiologist. Advised she call her cardiologist's office to follow up on increased bruising.   Miranda Ano, RN

## 2022-08-17 ENCOUNTER — Other Ambulatory Visit (HOSPITAL_BASED_OUTPATIENT_CLINIC_OR_DEPARTMENT_OTHER): Payer: Self-pay

## 2022-08-17 MED ORDER — OXYCODONE-ACETAMINOPHEN 10-325 MG PO TABS
1.0000 | ORAL_TABLET | ORAL | 0 refills | Status: DC | PRN
  Filled 2022-08-17: qty 120, 20d supply, fill #0

## 2022-09-19 ENCOUNTER — Other Ambulatory Visit (HOSPITAL_BASED_OUTPATIENT_CLINIC_OR_DEPARTMENT_OTHER): Payer: Self-pay

## 2022-09-19 ENCOUNTER — Other Ambulatory Visit: Payer: Self-pay

## 2022-09-19 MED ORDER — OXYCODONE-ACETAMINOPHEN 10-325 MG PO TABS
1.0000 | ORAL_TABLET | ORAL | 0 refills | Status: DC | PRN
  Filled 2022-09-19 (×2): qty 120, 20d supply, fill #0

## 2022-09-19 MED ORDER — OXYCODONE-ACETAMINOPHEN 10-325 MG PO TABS
1.0000 | ORAL_TABLET | ORAL | 0 refills | Status: DC | PRN
  Filled 2022-09-19: qty 120, 20d supply, fill #0

## 2022-09-20 ENCOUNTER — Other Ambulatory Visit: Payer: Self-pay

## 2022-09-21 ENCOUNTER — Other Ambulatory Visit: Payer: Self-pay

## 2022-09-21 ENCOUNTER — Other Ambulatory Visit (HOSPITAL_COMMUNITY): Payer: Self-pay

## 2022-09-30 ENCOUNTER — Other Ambulatory Visit (HOSPITAL_BASED_OUTPATIENT_CLINIC_OR_DEPARTMENT_OTHER): Payer: Self-pay

## 2022-10-17 ENCOUNTER — Other Ambulatory Visit (HOSPITAL_BASED_OUTPATIENT_CLINIC_OR_DEPARTMENT_OTHER): Payer: Self-pay

## 2022-10-17 DIAGNOSIS — G4700 Insomnia, unspecified: Secondary | ICD-10-CM | POA: Diagnosis not present

## 2022-10-17 DIAGNOSIS — E039 Hypothyroidism, unspecified: Secondary | ICD-10-CM | POA: Diagnosis not present

## 2022-10-17 DIAGNOSIS — M5459 Other low back pain: Secondary | ICD-10-CM | POA: Diagnosis not present

## 2022-10-17 DIAGNOSIS — M25569 Pain in unspecified knee: Secondary | ICD-10-CM | POA: Diagnosis not present

## 2022-10-17 DIAGNOSIS — K219 Gastro-esophageal reflux disease without esophagitis: Secondary | ICD-10-CM | POA: Diagnosis not present

## 2022-10-17 DIAGNOSIS — R269 Unspecified abnormalities of gait and mobility: Secondary | ICD-10-CM | POA: Diagnosis not present

## 2022-10-17 DIAGNOSIS — I10 Essential (primary) hypertension: Secondary | ICD-10-CM | POA: Diagnosis not present

## 2022-10-17 DIAGNOSIS — E539 Vitamin B deficiency, unspecified: Secondary | ICD-10-CM | POA: Diagnosis not present

## 2022-10-17 MED ORDER — OXYCODONE-ACETAMINOPHEN 10-325 MG PO TABS
1.0000 | ORAL_TABLET | ORAL | 0 refills | Status: DC | PRN
  Filled 2022-10-17: qty 120, 20d supply, fill #0

## 2022-11-06 ENCOUNTER — Other Ambulatory Visit: Payer: Self-pay | Admitting: Cardiology

## 2022-11-20 ENCOUNTER — Other Ambulatory Visit (HOSPITAL_BASED_OUTPATIENT_CLINIC_OR_DEPARTMENT_OTHER): Payer: Self-pay

## 2022-11-20 DIAGNOSIS — K219 Gastro-esophageal reflux disease without esophagitis: Secondary | ICD-10-CM | POA: Diagnosis not present

## 2022-11-20 DIAGNOSIS — M25569 Pain in unspecified knee: Secondary | ICD-10-CM | POA: Diagnosis not present

## 2022-11-20 DIAGNOSIS — I10 Essential (primary) hypertension: Secondary | ICD-10-CM | POA: Diagnosis not present

## 2022-11-20 DIAGNOSIS — E039 Hypothyroidism, unspecified: Secondary | ICD-10-CM | POA: Diagnosis not present

## 2022-11-20 DIAGNOSIS — M5459 Other low back pain: Secondary | ICD-10-CM | POA: Diagnosis not present

## 2022-11-20 DIAGNOSIS — E539 Vitamin B deficiency, unspecified: Secondary | ICD-10-CM | POA: Diagnosis not present

## 2022-11-20 DIAGNOSIS — R269 Unspecified abnormalities of gait and mobility: Secondary | ICD-10-CM | POA: Diagnosis not present

## 2022-11-20 MED ORDER — OXYCODONE-ACETAMINOPHEN 10-325 MG PO TABS
1.0000 | ORAL_TABLET | ORAL | 0 refills | Status: DC | PRN
  Filled 2022-11-20: qty 120, 20d supply, fill #0

## 2022-11-27 DIAGNOSIS — R200 Anesthesia of skin: Secondary | ICD-10-CM | POA: Diagnosis not present

## 2022-11-27 DIAGNOSIS — M79642 Pain in left hand: Secondary | ICD-10-CM | POA: Diagnosis not present

## 2022-11-27 DIAGNOSIS — R202 Paresthesia of skin: Secondary | ICD-10-CM | POA: Diagnosis not present

## 2022-11-28 ENCOUNTER — Other Ambulatory Visit: Payer: Self-pay | Admitting: Cardiology

## 2022-12-06 DIAGNOSIS — Z96652 Presence of left artificial knee joint: Secondary | ICD-10-CM | POA: Diagnosis not present

## 2022-12-06 DIAGNOSIS — Z96641 Presence of right artificial hip joint: Secondary | ICD-10-CM | POA: Diagnosis not present

## 2022-12-06 DIAGNOSIS — S76011A Strain of muscle, fascia and tendon of right hip, initial encounter: Secondary | ICD-10-CM | POA: Diagnosis not present

## 2022-12-19 ENCOUNTER — Other Ambulatory Visit (HOSPITAL_BASED_OUTPATIENT_CLINIC_OR_DEPARTMENT_OTHER): Payer: Self-pay

## 2022-12-19 DIAGNOSIS — M5459 Other low back pain: Secondary | ICD-10-CM | POA: Diagnosis not present

## 2022-12-19 DIAGNOSIS — I10 Essential (primary) hypertension: Secondary | ICD-10-CM | POA: Diagnosis not present

## 2022-12-19 DIAGNOSIS — E539 Vitamin B deficiency, unspecified: Secondary | ICD-10-CM | POA: Diagnosis not present

## 2022-12-19 DIAGNOSIS — K219 Gastro-esophageal reflux disease without esophagitis: Secondary | ICD-10-CM | POA: Diagnosis not present

## 2022-12-19 DIAGNOSIS — E039 Hypothyroidism, unspecified: Secondary | ICD-10-CM | POA: Diagnosis not present

## 2022-12-19 DIAGNOSIS — M25569 Pain in unspecified knee: Secondary | ICD-10-CM | POA: Diagnosis not present

## 2022-12-19 MED ORDER — OXYCODONE-ACETAMINOPHEN 10-325 MG PO TABS
1.0000 | ORAL_TABLET | ORAL | 0 refills | Status: DC | PRN
  Filled 2022-12-19: qty 120, 20d supply, fill #0

## 2022-12-19 MED ORDER — ALPRAZOLAM 1 MG PO TABS
1.0000 mg | ORAL_TABLET | Freq: Three times a day (TID) | ORAL | 0 refills | Status: DC
  Filled 2022-12-19: qty 90, 30d supply, fill #0

## 2023-01-17 DIAGNOSIS — G5602 Carpal tunnel syndrome, left upper limb: Secondary | ICD-10-CM | POA: Diagnosis not present

## 2023-01-17 DIAGNOSIS — G5612 Other lesions of median nerve, left upper limb: Secondary | ICD-10-CM | POA: Diagnosis not present

## 2023-01-18 ENCOUNTER — Other Ambulatory Visit (HOSPITAL_BASED_OUTPATIENT_CLINIC_OR_DEPARTMENT_OTHER): Payer: Self-pay

## 2023-01-18 DIAGNOSIS — D649 Anemia, unspecified: Secondary | ICD-10-CM | POA: Diagnosis not present

## 2023-01-18 DIAGNOSIS — G4700 Insomnia, unspecified: Secondary | ICD-10-CM | POA: Diagnosis not present

## 2023-01-18 DIAGNOSIS — M25569 Pain in unspecified knee: Secondary | ICD-10-CM | POA: Diagnosis not present

## 2023-01-18 DIAGNOSIS — E039 Hypothyroidism, unspecified: Secondary | ICD-10-CM | POA: Diagnosis not present

## 2023-01-18 DIAGNOSIS — K219 Gastro-esophageal reflux disease without esophagitis: Secondary | ICD-10-CM | POA: Diagnosis not present

## 2023-01-18 DIAGNOSIS — M5459 Other low back pain: Secondary | ICD-10-CM | POA: Diagnosis not present

## 2023-01-18 DIAGNOSIS — I10 Essential (primary) hypertension: Secondary | ICD-10-CM | POA: Diagnosis not present

## 2023-01-18 DIAGNOSIS — E539 Vitamin B deficiency, unspecified: Secondary | ICD-10-CM | POA: Diagnosis not present

## 2023-01-18 MED ORDER — OXYCODONE-ACETAMINOPHEN 10-325 MG PO TABS
1.0000 | ORAL_TABLET | ORAL | 0 refills | Status: DC | PRN
  Filled 2023-01-18: qty 120, 20d supply, fill #0

## 2023-01-18 MED ORDER — ALPRAZOLAM 1 MG PO TABS
1.0000 mg | ORAL_TABLET | Freq: Three times a day (TID) | ORAL | 0 refills | Status: DC
  Filled 2023-01-18: qty 90, 30d supply, fill #0

## 2023-01-19 ENCOUNTER — Other Ambulatory Visit: Payer: Self-pay | Admitting: Orthopedic Surgery

## 2023-01-24 ENCOUNTER — Other Ambulatory Visit: Payer: Self-pay

## 2023-01-24 ENCOUNTER — Other Ambulatory Visit: Payer: 59

## 2023-01-24 DIAGNOSIS — B20 Human immunodeficiency virus [HIV] disease: Secondary | ICD-10-CM

## 2023-01-24 DIAGNOSIS — Z113 Encounter for screening for infections with a predominantly sexual mode of transmission: Secondary | ICD-10-CM

## 2023-01-24 DIAGNOSIS — Z79899 Other long term (current) drug therapy: Secondary | ICD-10-CM | POA: Diagnosis not present

## 2023-01-25 LAB — T-HELPER CELL (CD4) - (RCID CLINIC ONLY)
CD4 % Helper T Cell: 45 % (ref 33–65)
CD4 T Cell Abs: 1243 /uL (ref 400–1790)

## 2023-01-26 LAB — CBC WITH DIFFERENTIAL/PLATELET
Absolute Lymphocytes: 3066 {cells}/uL (ref 850–3900)
Absolute Monocytes: 420 {cells}/uL (ref 200–950)
Basophils Absolute: 30 {cells}/uL (ref 0–200)
Basophils Relative: 0.5 %
Eosinophils Absolute: 132 {cells}/uL (ref 15–500)
Eosinophils Relative: 2.2 %
HCT: 38 % (ref 35.0–45.0)
Hemoglobin: 12 g/dL (ref 11.7–15.5)
MCH: 28 pg (ref 27.0–33.0)
MCHC: 31.6 g/dL — ABNORMAL LOW (ref 32.0–36.0)
MCV: 88.6 fL (ref 80.0–100.0)
MPV: 10.7 fL (ref 7.5–12.5)
Monocytes Relative: 7 %
Neutro Abs: 2352 {cells}/uL (ref 1500–7800)
Neutrophils Relative %: 39.2 %
Platelets: 279 10*3/uL (ref 140–400)
RBC: 4.29 10*6/uL (ref 3.80–5.10)
RDW: 13.1 % (ref 11.0–15.0)
Total Lymphocyte: 51.1 %
WBC: 6 10*3/uL (ref 3.8–10.8)

## 2023-01-26 LAB — LIPID PANEL
Cholesterol: 146 mg/dL (ref <200)
HDL: 79 mg/dL (ref >=50)
LDL Cholesterol (Calc): 51 mg/dL
Non-HDL Cholesterol (Calc): 67 mg/dL (ref <130)
Total CHOL/HDL Ratio: 1.8 (calc) (ref <5.0)
Triglycerides: 81 mg/dL (ref <150)

## 2023-01-26 LAB — COMPLETE METABOLIC PANEL WITH GFR
AG Ratio: 1.1 (calc) (ref 1.0–2.5)
ALT: 14 U/L (ref 6–29)
AST: 18 U/L (ref 10–35)
Albumin: 4 g/dL (ref 3.6–5.1)
Alkaline phosphatase (APISO): 100 U/L (ref 37–153)
BUN: 12 mg/dL (ref 7–25)
CO2: 29 mmol/L (ref 20–32)
Calcium: 10.6 mg/dL — ABNORMAL HIGH (ref 8.6–10.4)
Chloride: 104 mmol/L (ref 98–110)
Creat: 0.91 mg/dL (ref 0.50–1.05)
Globulin: 3.6 g/dL (ref 1.9–3.7)
Glucose, Bld: 92 mg/dL (ref 65–99)
Potassium: 3.9 mmol/L (ref 3.5–5.3)
Sodium: 142 mmol/L (ref 135–146)
Total Bilirubin: 0.4 mg/dL (ref 0.2–1.2)
Total Protein: 7.6 g/dL (ref 6.1–8.1)
eGFR: 69 mL/min/{1.73_m2} (ref >=60)

## 2023-01-26 LAB — RPR: RPR Ser Ql: NONREACTIVE

## 2023-01-26 LAB — HIV-1 RNA QUANT-NO REFLEX-BLD
HIV 1 RNA Quant: NOT DETECTED {copies}/mL
HIV-1 RNA Quant, Log: NOT DETECTED {Log}

## 2023-02-07 ENCOUNTER — Ambulatory Visit: Payer: 59 | Admitting: Internal Medicine

## 2023-02-07 ENCOUNTER — Other Ambulatory Visit: Payer: Self-pay

## 2023-02-07 ENCOUNTER — Encounter: Payer: Self-pay | Admitting: Internal Medicine

## 2023-02-07 VITALS — BP 147/90 | HR 75 | Temp 97.8°F | Ht 65.0 in | Wt 260.0 lb

## 2023-02-07 DIAGNOSIS — Z7185 Encounter for immunization safety counseling: Secondary | ICD-10-CM

## 2023-02-07 DIAGNOSIS — B20 Human immunodeficiency virus [HIV] disease: Secondary | ICD-10-CM | POA: Diagnosis not present

## 2023-02-07 DIAGNOSIS — Z113 Encounter for screening for infections with a predominantly sexual mode of transmission: Secondary | ICD-10-CM

## 2023-02-07 DIAGNOSIS — Z79899 Other long term (current) drug therapy: Secondary | ICD-10-CM

## 2023-02-07 DIAGNOSIS — Z5181 Encounter for therapeutic drug level monitoring: Secondary | ICD-10-CM

## 2023-02-07 MED ORDER — GENVOYA 150-150-200-10 MG PO TABS: 1.0000 | ORAL_TABLET | Freq: Every day | ORAL | 11 refills | Status: DC

## 2023-02-07 NOTE — Assessment & Plan Note (Signed)
 Screened negative

## 2023-02-07 NOTE — Assessment & Plan Note (Signed)
 Declines vaccines

## 2023-02-07 NOTE — Assessment & Plan Note (Signed)
 She continues to well and will continue with yearly follow-up.  Refills provided.  Labs reviewed with her.  No concerns and all questions answered.

## 2023-02-07 NOTE — Assessment & Plan Note (Signed)
 Creat and LFTs within normal limits.

## 2023-02-07 NOTE — Progress Notes (Signed)
   Subjective:    Patient ID: Miranda Kelley, female    DOB: 02-21-1955, 67 y.o.   MRN: [id number]  HPI Miranda Kelley is here for her yearly follow-up of HIV. She continues on Genvoya  and denies any missed doses.  She has had no issues with getting or taking her medication.  Since last year she did undergo knee replacement and has carpal tunnel release surgery upcoming.  She has no complaints today.   Review of Systems  Constitutional:  Negative for fatigue.  Gastrointestinal:  Negative for diarrhea and nausea.  Skin:  Negative for rash.       Objective:   Physical Exam Eyes:     General: No scleral icterus. Pulmonary:     Effort: Pulmonary effort is normal.  Neurological:     Mental Status: She is alert.    SH: no tobacco       Assessment & Plan:

## 2023-02-07 NOTE — Assessment & Plan Note (Signed)
Lipid panel reviewed with her.  

## 2023-02-14 ENCOUNTER — Other Ambulatory Visit: Payer: Self-pay | Admitting: Internal Medicine

## 2023-02-14 DIAGNOSIS — B20 Human immunodeficiency virus [HIV] disease: Secondary | ICD-10-CM

## 2023-02-14 NOTE — Telephone Encounter (Signed)
Trying to refill Genvoya for patient - multiple drug interactions popped up for alprazolam, triamcinolone, oxycodone, and Rosuvastatin.    Please advise.     Tharun Cappella Lesli Albee, CMA

## 2023-02-14 NOTE — Telephone Encounter (Signed)
Left a voicemail with patient. Will wait for her to return call. Will also send a MyChart message.

## 2023-02-14 NOTE — Telephone Encounter (Signed)
FYI

## 2023-02-14 NOTE — Telephone Encounter (Signed)
Good afternoon. Genvoya interacts with 4 of the patients meds (alprazolam, oxycodone, rosuvastatin, and triamcinolone cream). She has been on it since 2018. If she wants to stay on it, there are no contraindications. Would recommend monitoring for increased sedation/respiratory depression. If we switch treatment, Biktarvy would be a great alternative since there are no DDI with any of her meds.   Dr. Luciana Axe if you are okay with switching to [facility], we can contact the patient.

## 2023-02-15 ENCOUNTER — Other Ambulatory Visit (HOSPITAL_COMMUNITY): Payer: Self-pay

## 2023-02-15 ENCOUNTER — Other Ambulatory Visit: Payer: Self-pay

## 2023-02-15 ENCOUNTER — Telehealth: Payer: Self-pay

## 2023-02-15 DIAGNOSIS — B20 Human immunodeficiency virus [HIV] disease: Secondary | ICD-10-CM

## 2023-02-15 MED ORDER — BIKTARVY 50-200-25 MG PO TABS: 1.0000 | ORAL_TABLET | Freq: Every day | ORAL | 5 refills | Status: DC

## 2023-02-15 NOTE — Telephone Encounter (Signed)
Patient left voicemail stating she is out of medication. Pharmacy team has been trying to reach her to discuss DDIs. Routing to pharmacy team.   Sandie Ano, RN

## 2023-02-15 NOTE — Telephone Encounter (Signed)
Spoke with the patient. She agreed to switch from [place] to [place]. We counseled her on Biktarvy. She will reach out to [place] if she has any other questions. She requested the script to [facility] to [facility] [address]. $0 copay.

## 2023-02-15 NOTE — Telephone Encounter (Signed)
Patient returned call - could someone give her a call back? [phone]

## 2023-02-15 NOTE — Telephone Encounter (Signed)
Spoke with the patient today and discussed DDI with Genvoya. We discussed switching Genvoya to [place], and she was agreeable to this. We counseled her on Biktarvy. She was informed on how to take [facility] and the importance of keeping up with doses and appointments. Patient was instructed to reach out to [place] if at any point she needs samples to carry her over. [facility] [address] [place]. She will reach out to [place] if she has any questions.

## 2023-02-16 ENCOUNTER — Other Ambulatory Visit (HOSPITAL_COMMUNITY): Payer: Self-pay

## 2023-02-20 ENCOUNTER — Other Ambulatory Visit (HOSPITAL_BASED_OUTPATIENT_CLINIC_OR_DEPARTMENT_OTHER): Payer: Self-pay

## 2023-02-20 DIAGNOSIS — K219 Gastro-esophageal reflux disease without esophagitis: Secondary | ICD-10-CM | POA: Diagnosis not present

## 2023-02-20 DIAGNOSIS — E039 Hypothyroidism, unspecified: Secondary | ICD-10-CM | POA: Diagnosis not present

## 2023-02-20 DIAGNOSIS — M5459 Other low back pain: Secondary | ICD-10-CM | POA: Diagnosis not present

## 2023-02-20 DIAGNOSIS — I10 Essential (primary) hypertension: Secondary | ICD-10-CM | POA: Diagnosis not present

## 2023-02-20 MED ORDER — ALPRAZOLAM 1 MG PO TABS
1.0000 mg | ORAL_TABLET | Freq: Three times a day (TID) | ORAL | 0 refills | Status: DC
  Filled 2023-02-20: qty 90, 30d supply, fill #0

## 2023-02-20 MED ORDER — OXYCODONE-ACETAMINOPHEN 10-325 MG PO TABS
1.0000 | ORAL_TABLET | ORAL | 0 refills | Status: DC | PRN
  Filled 2023-02-20: qty 120, 20d supply, fill #0

## 2023-02-26 ENCOUNTER — Encounter (HOSPITAL_BASED_OUTPATIENT_CLINIC_OR_DEPARTMENT_OTHER): Payer: Self-pay | Admitting: Orthopedic Surgery

## 2023-02-27 ENCOUNTER — Encounter (HOSPITAL_BASED_OUTPATIENT_CLINIC_OR_DEPARTMENT_OTHER)
Admission: RE | Admit: 2023-02-27 | Discharge: 2023-02-27 | Disposition: A | Discharge status: Home / Self Care | Payer: 59 | Source: Ambulatory Visit | Attending: Orthopedic Surgery | Admitting: Orthopedic Surgery

## 2023-02-27 DIAGNOSIS — Z01810 Encounter for preprocedural cardiovascular examination: Secondary | ICD-10-CM | POA: Diagnosis not present

## 2023-02-27 DIAGNOSIS — Z01818 Encounter for other preprocedural examination: Secondary | ICD-10-CM | POA: Diagnosis present

## 2023-02-27 NOTE — Progress Notes (Signed)

## 2023-03-05 ENCOUNTER — Encounter (HOSPITAL_BASED_OUTPATIENT_CLINIC_OR_DEPARTMENT_OTHER)
Admission: RE | Disposition: A | Discharge status: Home / Self Care | Payer: Self-pay | Source: Home / Self Care | Attending: Orthopedic Surgery

## 2023-03-05 ENCOUNTER — Encounter (HOSPITAL_BASED_OUTPATIENT_CLINIC_OR_DEPARTMENT_OTHER): Payer: Self-pay | Admitting: Orthopedic Surgery

## 2023-03-05 ENCOUNTER — Other Ambulatory Visit: Payer: Self-pay

## 2023-03-05 ENCOUNTER — Ambulatory Visit (HOSPITAL_BASED_OUTPATIENT_CLINIC_OR_DEPARTMENT_OTHER)
Admission: RE | Admit: 2023-03-05 | Discharge: 2023-03-05 | Disposition: A | Discharge status: Home / Self Care | Payer: 59 | Attending: Orthopedic Surgery | Admitting: Orthopedic Surgery

## 2023-03-05 ENCOUNTER — Ambulatory Visit (HOSPITAL_BASED_OUTPATIENT_CLINIC_OR_DEPARTMENT_OTHER): Admitting: Anesthesiology

## 2023-03-05 DIAGNOSIS — Z6841 Body Mass Index (BMI) 40.0 and over, adult: Secondary | ICD-10-CM | POA: Insufficient documentation

## 2023-03-05 DIAGNOSIS — M1990 Unspecified osteoarthritis, unspecified site: Secondary | ICD-10-CM | POA: Diagnosis not present

## 2023-03-05 DIAGNOSIS — E039 Hypothyroidism, unspecified: Secondary | ICD-10-CM | POA: Insufficient documentation

## 2023-03-05 DIAGNOSIS — G5602 Carpal tunnel syndrome, left upper limb: Secondary | ICD-10-CM | POA: Diagnosis not present

## 2023-03-05 DIAGNOSIS — F419 Anxiety disorder, unspecified: Secondary | ICD-10-CM | POA: Diagnosis not present

## 2023-03-05 DIAGNOSIS — E66813 Obesity, class 3: Secondary | ICD-10-CM | POA: Diagnosis not present

## 2023-03-05 DIAGNOSIS — I10 Essential (primary) hypertension: Secondary | ICD-10-CM | POA: Diagnosis not present

## 2023-03-05 DIAGNOSIS — K219 Gastro-esophageal reflux disease without esophagitis: Secondary | ICD-10-CM | POA: Insufficient documentation

## 2023-03-05 DIAGNOSIS — Z01818 Encounter for other preprocedural examination: Secondary | ICD-10-CM

## 2023-03-05 DIAGNOSIS — I2510 Atherosclerotic heart disease of native coronary artery without angina pectoris: Secondary | ICD-10-CM | POA: Diagnosis not present

## 2023-03-05 HISTORY — PX: CARPAL TUNNEL RELEASE: SHX101

## 2023-03-05 SURGERY — CARPAL TUNNEL RELEASE
Anesthesia: General | Site: Hand | Laterality: Left

## 2023-03-05 MED ORDER — ONDANSETRON HCL 4 MG/2ML IJ SOLN
INTRAMUSCULAR | Status: AC
  Filled 2023-03-05: qty 2

## 2023-03-05 MED ORDER — PROPOFOL 10 MG/ML IV BOLUS
INTRAVENOUS | Status: DC | PRN
  Administered 2023-03-05: 150 mg via INTRAVENOUS
  Administered 2023-03-05 (×3): 50 mg via INTRAVENOUS

## 2023-03-05 MED ORDER — DEXAMETHASONE SODIUM PHOSPHATE 4 MG/ML IJ SOLN
INTRAMUSCULAR | Status: DC | PRN
  Administered 2023-03-05: 5 mg via INTRAVENOUS

## 2023-03-05 MED ORDER — CEFAZOLIN SODIUM-DEXTROSE 2-4 GM/100ML-% IV SOLN
2.0000 g | INTRAVENOUS | Status: AC
  Administered 2023-03-05: 2 g via INTRAVENOUS

## 2023-03-05 MED ORDER — PROPOFOL 500 MG/50ML IV EMUL
INTRAVENOUS | Status: DC | PRN
  Administered 2023-03-05: 25 ug/kg/min via INTRAVENOUS

## 2023-03-05 MED ORDER — PROPOFOL 500 MG/50ML IV EMUL
INTRAVENOUS | Status: AC
  Filled 2023-03-05: qty 50

## 2023-03-05 MED ORDER — 0.9 % SODIUM CHLORIDE (POUR BTL) OPTIME
TOPICAL | Status: DC | PRN
  Administered 2023-03-05: 500 mL

## 2023-03-05 MED ORDER — ACETAMINOPHEN 500 MG PO TABS
1000.0000 mg | ORAL_TABLET | Freq: Once | ORAL | Status: AC
  Administered 2023-03-05: 1000 mg via ORAL

## 2023-03-05 MED ORDER — FENTANYL CITRATE (PF) 100 MCG/2ML IJ SOLN
25.0000 ug | INTRAMUSCULAR | Status: DC | PRN
  Administered 2023-03-05: 50 ug via INTRAVENOUS

## 2023-03-05 MED ORDER — LIDOCAINE HCL (CARDIAC) PF 100 MG/5ML IV SOSY
PREFILLED_SYRINGE | INTRAVENOUS | Status: DC | PRN
  Administered 2023-03-05: 60 mg via INTRAVENOUS

## 2023-03-05 MED ORDER — DEXAMETHASONE SODIUM PHOSPHATE 10 MG/ML IJ SOLN
INTRAMUSCULAR | Status: AC
  Filled 2023-03-05: qty 1

## 2023-03-05 MED ORDER — LIDOCAINE 2% (20 MG/ML) 5 ML SYRINGE
INTRAMUSCULAR | Status: AC
  Filled 2023-03-05: qty 5

## 2023-03-05 MED ORDER — ACETAMINOPHEN 500 MG PO TABS
ORAL_TABLET | ORAL | Status: AC
  Filled 2023-03-05: qty 2

## 2023-03-05 MED ORDER — ONDANSETRON HCL 4 MG/2ML IJ SOLN
INTRAMUSCULAR | Status: DC | PRN
  Administered 2023-03-05: 4 mg via INTRAVENOUS

## 2023-03-05 MED ORDER — FENTANYL CITRATE (PF) 100 MCG/2ML IJ SOLN
INTRAMUSCULAR | Status: DC | PRN
  Administered 2023-03-05: 25 ug via INTRAVENOUS
  Administered 2023-03-05: 50 ug via INTRAVENOUS
  Administered 2023-03-05: 25 ug via INTRAVENOUS

## 2023-03-05 MED ORDER — FENTANYL CITRATE (PF) 100 MCG/2ML IJ SOLN
INTRAMUSCULAR | Status: AC
  Filled 2023-03-05: qty 2

## 2023-03-05 MED ORDER — BUPIVACAINE HCL (PF) 0.25 % IJ SOLN
INTRAMUSCULAR | Status: DC | PRN
  Administered 2023-03-05: 9 mL

## 2023-03-05 MED ORDER — FENTANYL CITRATE (PF) 100 MCG/2ML IJ SOLN
INTRAMUSCULAR | Status: AC
Start: 2023-03-05 — End: ?
  Filled 2023-03-05: qty 2

## 2023-03-05 MED ORDER — LACTATED RINGERS IV SOLN: INTRAVENOUS | Status: DC

## 2023-03-05 MED ORDER — CEFAZOLIN SODIUM-DEXTROSE 2-4 GM/100ML-% IV SOLN
INTRAVENOUS | Status: AC
  Filled 2023-03-05: qty 100

## 2023-03-05 SURGICAL SUPPLY — 30 items
BLADE SURG 15 STRL LF DISP TIS (BLADE) ×4 IMPLANT
BNDG ELASTIC 3INX 5YD STR LF (GAUZE/BANDAGES/DRESSINGS) ×2 IMPLANT
BNDG ESMARK 4X9 LF (GAUZE/BANDAGES/DRESSINGS) IMPLANT
BNDG GAUZE DERMACEA FLUFF 4 (GAUZE/BANDAGES/DRESSINGS) ×2 IMPLANT
CHLORAPREP W/TINT 26 (MISCELLANEOUS) ×2 IMPLANT
CORD BIPOLAR FORCEPS 12FT (ELECTRODE) ×2 IMPLANT
COVER BACK TABLE 60X90IN (DRAPES) ×2 IMPLANT
COVER MAYO STAND STRL (DRAPES) ×2 IMPLANT
CUFF TOURN SGL QUICK 18X4 (TOURNIQUET CUFF) ×2 IMPLANT
DRAPE EXTREMITY T 121X128X90 (DISPOSABLE) ×2 IMPLANT
DRAPE SURG 17X23 STRL (DRAPES) ×2 IMPLANT
GAUZE PAD ABD 8X10 STRL (GAUZE/BANDAGES/DRESSINGS) ×2 IMPLANT
GAUZE SPONGE 4X4 12PLY STRL (GAUZE/BANDAGES/DRESSINGS) ×2 IMPLANT
GAUZE XEROFORM 1X8 LF (GAUZE/BANDAGES/DRESSINGS) ×2 IMPLANT
GLOVE BIO SURGEON STRL SZ7.5 (GLOVE) ×2 IMPLANT
GLOVE BIOGEL PI IND STRL 8 (GLOVE) ×2 IMPLANT
GOWN STRL REUS W/ TWL LRG LVL3 (GOWN DISPOSABLE) ×2 IMPLANT
GOWN STRL REUS W/TWL XL LVL3 (GOWN DISPOSABLE) ×2 IMPLANT
NDL HYPO 25X1 1.5 SAFETY (NEEDLE) ×2 IMPLANT
NEEDLE HYPO 25X1 1.5 SAFETY (NEEDLE) ×1 IMPLANT
NS IRRIG 1000ML POUR BTL (IV SOLUTION) ×2 IMPLANT
PACK BASIN DAY SURGERY FS (CUSTOM PROCEDURE TRAY) ×2 IMPLANT
PADDING CAST ABS COTTON 4X4 ST (CAST SUPPLIES) ×2 IMPLANT
STOCKINETTE 4X48 STRL (DRAPES) ×2 IMPLANT
SUT ETHILON 4 0 PS 2 18 (SUTURE) ×2 IMPLANT
SYR 10ML LL (SYRINGE) IMPLANT
SYR BULB EAR ULCER 3OZ GRN STR (SYRINGE) ×2 IMPLANT
SYR CONTROL 10ML LL (SYRINGE) ×2 IMPLANT
TOWEL GREEN STERILE FF (TOWEL DISPOSABLE) ×4 IMPLANT
UNDERPAD 30X36 HEAVY ABSORB (UNDERPADS AND DIAPERS) ×2 IMPLANT

## 2023-03-05 NOTE — Op Note (Signed)
 03/05/2023 [facility]                              OPERATIVE REPORT   PREOPERATIVE DIAGNOSIS:  Left carpal tunnel syndrome  POSTOPERATIVE DIAGNOSIS:  Left carpal tunnel syndrome  PROCEDURE:  Left carpal tunnel release  SURGEON:  Betha Loa, MD  ASSISTANT:  none.  ANESTHESIA: General  IV FLUIDS:  Per anesthesia flow sheet  ESTIMATED BLOOD LOSS:  Minimal  COMPLICATIONS:  None  SPECIMENS:  None  TOURNIQUET TIME:    Total Tourniquet Time Documented: Upper Arm (Left) - 12 minutes Total: Upper Arm (Left) - 12 minutes   DISPOSITION:  Stable to PACU  LOCATION: [facility]  INDICATIONS:  68 y.o. yo female with numbness and tingling left hand.  Nocturnal symptoms. Positive nerve conduction studies. She wishes to proceed with left carpal tunnel release.  Risks, benefits and alternatives of surgery were discussed including the risk of blood loss; infection; damage to nerves, vessels, tendons, ligaments, bone; failure of surgery; need for additional surgery; complications with wound healing; continued pain; recurrence of carpal tunnel syndrome; and damage to motor branch. She voiced understanding of these risks and elected to proceed.   OPERATIVE COURSE:  After being identified preoperatively by myself, the patient and I agreed upon the procedure and site of procedure.  The surgical site was marked.  Surgical consent had been signed.  She was given IV Ancef as preoperative antibiotic prophylaxis.  She was transferred to the operating room and placed on the operating room table in supine position with the Left upper extremity on an armboard.  General anesthesia was induced by the anesthesiologist.  Left upper extremity was prepped and draped in normal sterile orthopaedic fashion.  A surgical pause was performed between the surgeons, anesthesia, and operating room staff, and all were in agreement as to the patient, procedure, and site of procedure.  Tourniquet at  the proximal aspect of the extremity was inflated to 250 mmHg after exsanguination of the arm with an Esmarch bandage  Incision was made over the transverse carpal ligament and carried into the subcutaneous tissues by spreading technique.  Bipolar electrocautery was used to obtain hemostasis.  The palmar fascia was sharply incised.  The transverse carpal ligament was identified.  The fascia distal to the ligament was opened.  Retractor was placed and the flexor tendons were identified.  The flexor tendon to the little finger was identified and retracted radially.  The transverse carpal ligament was then incised from distal to proximal under direct visualization.  Scissors were used to split the distal aspect of the volar antebrachial fascia.  A finger was placed into the wound to ensure complete decompression, which was the case.  The nerve was examined.  It was flattened and hyperemic.  The motor branch was identified and was intact.  The wound was copiously irrigated with sterile saline.  It was then closed with 4-0 nylon in a horizontal mattress fashion.  It was injected with 0.25% plain Marcaine to aid in postoperative analgesia.  It was dressed with sterile Xeroform, 4x4s, an ABD, and wrapped with Kerlix and an Ace bandage.  Tourniquet was deflated at 12 minutes.  Fingertips were pink with brisk capillary refill after deflation of the tourniquet.  Operative drapes were broken down.  The patient was awoken from anesthesia safely.  She was transferred back to stretcher and taken to the PACU in stable condition.  I will see her back in the office in 1 week for postoperative followup.  She states she has pain medication at home already.    Betha Loa, MD Electronically signed, 03/05/23

## 2023-03-05 NOTE — Anesthesia Post-op Follow-up Note (Deleted)
  Immediate Anesthesia Transfer of Care Note  Patient: Miranda Kelley  Procedure(s) Performed: Procedure(s) (LRB): LEFT CARPAL TUNNEL RELEASE (Left)  Patient Location: PACU  Anesthesia Type: GA  Level of Consciousness: awake, sedated, patient cooperative and responds to stimulation, c/o pain in back - comfort measures given w/ medication   Airway & Oxygen Therapy: Patient Spontanous Breathing and Patient connected to [place] oxygen  Post-op Assessment: Report given to PACU RN, Post -op Vital signs reviewed and stable and Patient moving all extremities  Post vital signs: Reviewed and stable  Complications: No apparent anesthesia complications

## 2023-03-05 NOTE — Anesthesia Postprocedure Evaluation (Signed)
 Anesthesia Post Note  Patient: Miranda Kelley  Procedure(s) Performed: LEFT CARPAL TUNNEL RELEASE (Left: Hand)     Patient location during evaluation: PACU Anesthesia Type: General Level of consciousness: awake and alert Pain management: pain level controlled Vital Signs Assessment: post-procedure vital signs reviewed and stable Respiratory status: spontaneous breathing, nonlabored ventilation and respiratory function stable Cardiovascular status: blood pressure returned to baseline and stable Postop Assessment: no apparent nausea or vomiting Anesthetic complications: no  No notable events documented.  Last Vitals:  Vitals:   03/05/23 1430 03/05/23 1453  BP: (!) 162/87 98/81  Pulse: 71 68  Resp: 18 16  Temp:  36.4 C  SpO2: 100% 96%    Last Pain:  Vitals:   03/05/23 1453  TempSrc: Temporal  PainSc: 2                  Taichi Repka,W. EDMOND

## 2023-03-05 NOTE — Transfer of Care (Signed)
 Immediate Anesthesia Transfer of Care Note  Patient: Miranda Kelley  Procedure(s) Performed: Procedure(s) (LRB): LEFT CARPAL TUNNEL RELEASE (Left)  Patient Location: PACU  Anesthesia Type: GA  Level of Consciousness: awake, sedated, patient cooperative and responds to stimulation, c/o pain in back - comfort measures given w/ medication   Airway & Oxygen Therapy: Patient Spontanous Breathing and Patient connected to [place] oxygen  Post-op Assessment: Report given to PACU RN, Post -op Vital signs reviewed and stable and Patient moving all extremities  Post vital signs: Reviewed and stable  Complications: No apparent anesthesia complications

## 2023-03-05 NOTE — Anesthesia Procedure Notes (Signed)
 Procedure Name: LMA Insertion Date/Time: 03/05/2023 1:26 PM  Performed by: Jessica Priest, CRNAPre-anesthesia Checklist: Patient identified, Emergency Drugs available, Suction available, Patient being monitored and Timeout performed Patient Re-evaluated:Patient Re-evaluated prior to induction Oxygen Delivery Method: Circle system utilized Preoxygenation: Pre-oxygenation with 100% oxygen Induction Type: IV induction Ventilation: Mask ventilation without difficulty LMA: LMA inserted LMA Size: 4.0 Number of attempts: 1 Airway Equipment and Method: Bite block Placement Confirmation: positive ETCO2, breath sounds checked- equal and bilateral and CO2 detector Tube secured with: Tape Dental Injury: Teeth and Oropharynx as per pre-operative assessment

## 2023-03-05 NOTE — H&P (Signed)
 Miranda Kelley is an 68 y.o. female.   Chief Complaint: carpal tunnel syndrome HPI: 68 y.o. yo female with numbness and tingling left hand.  Nocturnal symptoms. Positive nerve conduction studies. She wishes to have left carpal tunnel release.   Allergies: No Known Allergies  Past Medical History:  Diagnosis Date   Anxiety    Arthritis    knees   Cataract    surgery - bilateral   Chronic sinus infection    Dyspnea    HIV infection (HCC)    Hypertension    hx - no longer a problem - weight loss surgery -lost 100lbs   Hypothyroidism    Pneumonia    Thyroid disease     Past Surgical History:  Procedure Laterality Date   cataract surgery Bilateral    CHOLECYSTECTOMY  06/05/2011   Procedure: LAPAROSCOPIC CHOLECYSTECTOMY WITH INTRAOPERATIVE CHOLANGIOGRAM;  Surgeon: Mariella Saa, MD;  Location: [facility];  Service: General;  Laterality: N/A;   JOINT REPLACEMENT  06/02/2009   right knee   LAPAROSCOPIC GASTRIC SLEEVE RESECTION  01/02/2013   TOTAL HIP ARTHROPLASTY Right    TOTAL KNEE ARTHROPLASTY Left 05/16/2022   Procedure: TOTAL KNEE ARTHROPLASTY;  Surgeon: Durene Romans, MD;  Location: [facility];  Service: Orthopedics;  Laterality: Left;    Family History: Family History  Problem Relation Age of Onset   Heart failure Mother    Heart disease Mother    Breast cancer Maternal Grandmother    Colon cancer Neg Hx    Colon polyps Neg Hx    Esophageal cancer Neg Hx    Rectal cancer Neg Hx    Stomach cancer Neg Hx     Social History:   reports that she has never smoked. She has never used smokeless tobacco. She reports current alcohol use. She reports that she does not use drugs.  Medications: Medications Prior to Admission  Medication Sig Dispense Refill   acetaminophen (TYLENOL) 500 MG tablet Take 2 tablets (1,000 mg total) by mouth every 6 (six) hours. 30 tablet 0   alendronate (FOSAMAX) 70 MG tablet Take 70 mg by mouth once a week.     ALPRAZolam (XANAX) 1 MG tablet Take 1 mg  by mouth 3 (three) times daily as needed for anxiety.     bictegravir-emtricitabine-tenofovir AF (BIKTARVY) 50-200-25 MG TABS tablet Take 1 tablet by mouth daily. 30 tablet 5   Cholecalciferol (VITAMIN D3) 2000 units TABS Take 2,000 Units by mouth daily.      cyanocobalamin (,VITAMIN B-12,) 1000 MCG/ML injection Inject 1,000 mcg into the muscle every 30 (thirty) days.     levothyroxine (SYNTHROID, LEVOTHROID) 150 MCG tablet Take 150 mcg by mouth daily before breakfast.     linaCLOtide (LINZESS PO) Take by mouth.     oxyCODONE-acetaminophen (PERCOCET) 10-325 MG tablet Take 1 tablet by mouth every 4-6 hours as needed. 120 tablet 0   rosuvastatin (CRESTOR) 40 MG tablet Take 1 tablet (40 mg total) by mouth daily. NEED OV FOR FUTURE REFILLS. 90 tablet 0   tiZANidine (ZANAFLEX) 4 MG tablet Take 1 tablet (4 mg total) by mouth every 8 (eight) hours as needed for muscle spasms. Do not take cyclobenzaprine while on this medication 30 tablet 0   albuterol (VENTOLIN HFA) 108 (90 Base) MCG/ACT inhaler Inhale 2 puffs into the lungs every 6 (six) hours as needed for wheezing or shortness of breath.     ALPRAZolam (XANAX) 1 MG tablet Take 1 tablet (1 mg total) by mouth 3 (three) times  daily. 90 tablet 0   clobetasol ointment (TEMOVATE) 0.05 % Apply 1 Application topically 2 (two) times daily.     diclofenac Sodium (VOLTAREN) 1 % GEL Apply 2 g topically daily as needed (pain).     naloxone (NARCAN) nasal spray 4 mg/0.1 mL Spray into nostril with signs of opioid related oversedation or overdose 1 each 0   nystatin ointment (MYCOSTATIN) 1 Application daily as needed (yeast).     oxyCODONE 10 MG TABS Take 1.5 tablets (15 mg total) by mouth every 4 (four) hours as needed for severe pain. 42 tablet 0   oxyCODONE-acetaminophen (PERCOCET) 10-325 MG tablet Take 1 tablet by mouth every 4 (four) hours to 6 (six) hours as needed. 120 tablet 0   oxyCODONE-acetaminophen (PERCOCET) 10-325 MG tablet Take 1 tablet by mouth every 4  (four) to 6 (six) hours as needed. 120 tablet 0   oxyCODONE-acetaminophen (PERCOCET) 10-325 MG tablet Take 1 tablet by mouth every 4 (four) to 6 (six) hours as needed. 120 tablet 0   oxyCODONE-acetaminophen (PERCOCET) 10-325 MG tablet Take 1 tablet by mouth every 4 (four) to 6 (six)  hours as needed. 120 tablet 0   oxyCODONE-acetaminophen (PERCOCET) 10-325 MG tablet Take 1 tablet by mouth every 4 (four) - 6 (six) hours as needed. 120 tablet 0   oxyCODONE-acetaminophen (PERCOCET) 10-325 MG tablet Take 1 tablet by mouth every 4 (four) to 6 (six) hours as needed. 120 tablet 0   oxyCODONE-acetaminophen (PERCOCET) 10-325 MG tablet Take 1 tablet by mouth every 4 (four) to 6 (six) hours as needed. 120 tablet 0   oxyCODONE-acetaminophen (PERCOCET) 10-325 MG tablet Take 1 tablet by mouth every 4 (four) to 6 (six) hours as needed. 120 tablet 0   oxyCODONE-acetaminophen (PERCOCET) 10-325 MG tablet Take 1 tablet by mouth every 4 (four) to 6 (six) hours as needed. 120 tablet 0   triamcinolone cream (KENALOG) 0.1 % Apply 1 Application topically daily as needed (irritation).      No results found for this or any previous visit (from the past 48 hours).  No results found.    Temperature 98 F (36.7 C), temperature source Temporal, height 5\' 3"  (1.6 m), weight 117.5 kg, last menstrual period 04/03/2011.  General appearance: alert, cooperative, and appears stated age Head: Normocephalic, without obvious abnormality, atraumatic Neck: supple, symmetrical, trachea midline Extremities: Intact sensation and capillary refill all digits.  +epl/fpl/io.  No wounds.  Skin: Skin color, texture, turgor normal. No rashes or lesions Neurologic: Grossly normal Incision/Wound: none  Assessment/Plan Left carpal tunnel syndrome.  Non operative and operative treatment options have been discussed with the patient and patient wishes to proceed with operative treatment. Risks, benefits and alternatives of surgery were  discussed including risks of blood loss, infection, damage to nerves/vessels/tendons/ligament/bone, failure of surgery, need for additional surgery, complication with wound healing, stiffness, recurrence.  She voiced understanding of these risks and elected to proceed.    Betha Loa 03/05/2023, 1:08 PM

## 2023-03-05 NOTE — Discharge Instructions (Addendum)
 [facility] Instructions Hand Surgery  Wound Care: Keep your hand elevated above the level of your heart.  Do not allow it to dangle by your side.  Keep the dressing dry and do not remove it unless your doctor advises you to do so.  He will usually change it at the time of your post-op visit.  Moving your fingers is advised to stimulate circulation but will depend on the site of your surgery.  If you have a splint applied, your doctor will advise you regarding movement.  Activity: Do not drive or operate machinery today.  Rest today and then you may return to your normal activity and work as indicated by your physician.  Diet:  Drink liquids today or eat a light diet.  You may resume a regular diet tomorrow.    General expectations: Pain for two to three days. Fingers may become slightly swollen.  Call your doctor if any of the following occur: Severe pain not relieved by pain medication. Elevated temperature. Dressing soaked with blood. Inability to move fingers. White or bluish color to fingers.    Post Anesthesia Home Care Instructions  Activity: Get plenty of rest for the remainder of the day. A responsible individual must stay with you for 24 hours following the procedure.  For the next 24 hours, DO NOT: -Drive a car -Advertising copywriter -Drink alcoholic beverages -Take any medication unless instructed by your physician -Make any legal decisions or sign important papers.  Meals: Start with liquid foods such as gelatin or soup. Progress to regular foods as tolerated. Avoid greasy, spicy, heavy foods. If nausea and/or vomiting occur, drink only clear liquids until the nausea and/or vomiting subsides. Call your physician if vomiting continues.  Special Instructions/Symptoms: Your throat may feel dry or sore from the anesthesia or the breathing tube placed in your throat during surgery. If this causes discomfort, gargle with warm salt water. The discomfort should disappear  within 24 hours.  If you had a scopolamine patch placed behind your ear for the management of post- operative nausea and/or vomiting:  1. The medication in the patch is effective for 72 hours, after which it should be removed.  Wrap patch in a tissue and discard in the trash. Wash hands thoroughly with soap and water. 2. You may remove the patch earlier than 72 hours if you experience unpleasant side effects which may include dry mouth, dizziness or visual disturbances. 3. Avoid touching the patch. Wash your hands with soap and water after contact with the patch.  No tylenol until after 6:05 if needed.

## 2023-03-05 NOTE — Anesthesia Preprocedure Evaluation (Addendum)
 Anesthesia Evaluation  Patient identified by MRN, date of birth, ID band Patient awake    Reviewed: Allergy & Precautions, H&P , NPO status , Patient's Chart, lab work & pertinent test results  Airway Mallampati: II  TM Distance: >3 FB Neck ROM: Full    Dental no notable dental hx. (+) Teeth Intact, Dental Advisory Given   Pulmonary neg pulmonary ROS   Pulmonary exam normal breath sounds clear to auscultation       Cardiovascular hypertension, Pt. on medications  Rhythm:Regular Rate:Normal     Neuro/Psych   Anxiety     negative neurological ROS     GI/Hepatic Neg liver ROS,GERD  ,,  Endo/Other  Hypothyroidism  Class 3 obesity  Renal/GU negative Renal ROS  negative genitourinary   Musculoskeletal  (+) Arthritis , Osteoarthritis,    Abdominal   Peds  Hematology negative hematology ROS (+)   Anesthesia Other Findings   Reproductive/Obstetrics negative OB ROS                             Anesthesia Physical Anesthesia Plan  ASA: 3  Anesthesia Plan: General   Post-op Pain Management: Tylenol PO (pre-op)*   Induction: Intravenous  PONV Risk Score and Plan: 4 or greater and Ondansetron, Dexamethasone, Propofol infusion and TIVA  Airway Management Planned: LMA  Additional Equipment:   Intra-op Plan:   Post-operative Plan: Extubation in OR  Informed Consent: I have reviewed the patients History and Physical, chart, labs and discussed the procedure including the risks, benefits and alternatives for the proposed anesthesia with the patient or authorized representative who has indicated his/her understanding and acceptance.     Dental advisory given  Plan Discussed with: CRNA  Anesthesia Plan Comments:        Anesthesia Quick Evaluation

## 2023-03-06 ENCOUNTER — Encounter (HOSPITAL_BASED_OUTPATIENT_CLINIC_OR_DEPARTMENT_OTHER): Payer: Self-pay | Admitting: Orthopedic Surgery

## 2023-03-12 DIAGNOSIS — G5602 Carpal tunnel syndrome, left upper limb: Secondary | ICD-10-CM | POA: Diagnosis not present

## 2023-03-14 ENCOUNTER — Other Ambulatory Visit: Payer: Self-pay | Admitting: Family Medicine

## 2023-03-14 DIAGNOSIS — Z1231 Encounter for screening mammogram for malignant neoplasm of breast: Secondary | ICD-10-CM

## 2023-03-19 ENCOUNTER — Other Ambulatory Visit (HOSPITAL_BASED_OUTPATIENT_CLINIC_OR_DEPARTMENT_OTHER): Payer: Self-pay

## 2023-03-19 DIAGNOSIS — K219 Gastro-esophageal reflux disease without esophagitis: Secondary | ICD-10-CM | POA: Diagnosis not present

## 2023-03-19 DIAGNOSIS — M25569 Pain in unspecified knee: Secondary | ICD-10-CM | POA: Diagnosis not present

## 2023-03-19 DIAGNOSIS — E039 Hypothyroidism, unspecified: Secondary | ICD-10-CM | POA: Diagnosis not present

## 2023-03-19 DIAGNOSIS — D649 Anemia, unspecified: Secondary | ICD-10-CM | POA: Diagnosis not present

## 2023-03-19 DIAGNOSIS — R5383 Other fatigue: Secondary | ICD-10-CM | POA: Diagnosis not present

## 2023-03-19 DIAGNOSIS — M5459 Other low back pain: Secondary | ICD-10-CM | POA: Diagnosis not present

## 2023-03-19 DIAGNOSIS — I10 Essential (primary) hypertension: Secondary | ICD-10-CM | POA: Diagnosis not present

## 2023-03-19 DIAGNOSIS — R233 Spontaneous ecchymoses: Secondary | ICD-10-CM | POA: Diagnosis not present

## 2023-03-19 MED ORDER — OXYCODONE-ACETAMINOPHEN 10-325 MG PO TABS
1.0000 | ORAL_TABLET | ORAL | 0 refills | Status: DC
  Filled 2023-03-20 – 2023-03-22 (×3): qty 120, 20d supply, fill #0

## 2023-03-19 MED ORDER — ALPRAZOLAM 1 MG PO TABS
1.0000 mg | ORAL_TABLET | Freq: Three times a day (TID) | ORAL | 0 refills | Status: DC
  Filled 2023-03-20: qty 90, 30d supply, fill #0

## 2023-03-20 ENCOUNTER — Other Ambulatory Visit: Payer: Self-pay

## 2023-03-20 ENCOUNTER — Other Ambulatory Visit (HOSPITAL_BASED_OUTPATIENT_CLINIC_OR_DEPARTMENT_OTHER): Payer: Self-pay

## 2023-03-20 ENCOUNTER — Ambulatory Visit
Admission: RE | Admit: 2023-03-20 | Discharge: 2023-03-20 | Disposition: A | Discharge status: Home / Self Care | Source: Ambulatory Visit | Attending: Family Medicine | Admitting: Family Medicine

## 2023-03-20 DIAGNOSIS — Z1231 Encounter for screening mammogram for malignant neoplasm of breast: Secondary | ICD-10-CM

## 2023-03-21 ENCOUNTER — Other Ambulatory Visit (HOSPITAL_BASED_OUTPATIENT_CLINIC_OR_DEPARTMENT_OTHER): Payer: Self-pay

## 2023-03-21 DIAGNOSIS — G5602 Carpal tunnel syndrome, left upper limb: Secondary | ICD-10-CM | POA: Diagnosis not present

## 2023-03-22 ENCOUNTER — Other Ambulatory Visit (HOSPITAL_BASED_OUTPATIENT_CLINIC_OR_DEPARTMENT_OTHER): Payer: Self-pay

## 2023-03-26 ENCOUNTER — Other Ambulatory Visit (HOSPITAL_BASED_OUTPATIENT_CLINIC_OR_DEPARTMENT_OTHER): Payer: Self-pay

## 2023-03-27 ENCOUNTER — Encounter: Payer: Self-pay | Admitting: Nurse Practitioner

## 2023-03-27 ENCOUNTER — Ambulatory Visit: Attending: Nurse Practitioner | Admitting: Nurse Practitioner

## 2023-03-27 VITALS — BP 118/82 | HR 74 | Ht 63.0 in | Wt 262.2 lb

## 2023-03-27 DIAGNOSIS — E785 Hyperlipidemia, unspecified: Secondary | ICD-10-CM

## 2023-03-27 DIAGNOSIS — E039 Hypothyroidism, unspecified: Secondary | ICD-10-CM | POA: Diagnosis not present

## 2023-03-27 DIAGNOSIS — I10 Essential (primary) hypertension: Secondary | ICD-10-CM | POA: Diagnosis not present

## 2023-03-27 DIAGNOSIS — B20 Human immunodeficiency virus [HIV] disease: Secondary | ICD-10-CM

## 2023-03-27 DIAGNOSIS — R931 Abnormal findings on diagnostic imaging of heart and coronary circulation: Secondary | ICD-10-CM | POA: Diagnosis not present

## 2023-03-27 MED ORDER — ROSUVASTATIN CALCIUM 40 MG PO TABS: 40.0000 mg | ORAL_TABLET | Freq: Every day | ORAL | 3 refills | Status: AC

## 2023-03-27 NOTE — Progress Notes (Unsigned)
 Office Visit    Patient Name: Miranda Kelley Date of Encounter: 03/27/2023  Primary Care Provider:  Leilani Able, MD Primary Cardiologist:  None  Chief Complaint    68 year old female with a history of elevated coronary calcium score, family history of early CAD, hypertension, hyperlipidemia, HIV, hypothyroidism, arthritis, and obesity who presents for follow-up related to chest pain and shortness of breath.  Past Medical History    Past Medical History:  Diagnosis Date   Anxiety    Arthritis    knees   Cataract    surgery - bilateral   Chronic sinus infection    Dyspnea    HIV infection (HCC)    Hypertension    hx - no longer a problem - weight loss surgery -lost 100lbs   Hypothyroidism    Pneumonia    Thyroid disease    Past Surgical History:  Procedure Laterality Date   CARPAL TUNNEL RELEASE Left 03/05/2023   Procedure: LEFT CARPAL TUNNEL RELEASE;  Surgeon: Betha Loa, MD;  Location: [facility];  Service: Orthopedics;  Laterality: Left;  30 MIN   cataract surgery Bilateral    CHOLECYSTECTOMY  06/05/2011   Procedure: LAPAROSCOPIC CHOLECYSTECTOMY WITH INTRAOPERATIVE CHOLANGIOGRAM;  Surgeon: Mariella Saa, MD;  Location: [facility];  Service: General;  Laterality: N/A;   JOINT REPLACEMENT  06/02/2009   right knee   LAPAROSCOPIC GASTRIC SLEEVE RESECTION  01/02/2013   TOTAL HIP ARTHROPLASTY Right    TOTAL KNEE ARTHROPLASTY Left 05/16/2022   Procedure: TOTAL KNEE ARTHROPLASTY;  Surgeon: Durene Romans, MD;  Location: [facility];  Service: Orthopedics;  Laterality: Left;    Allergies  No Known Allergies   Labs/Other Studies Reviewed    The following studies were reviewed today:  Cardiac Studies & Procedures   ______________________________________________________________________________________________          CT SCANS  CT CARDIAC SCORING (SELF PAY ONLY) 04/20/2021  Addendum 04/21/2021  9:54 AM ADDENDUM REPORT: 04/21/2021 09:52  CLINICAL  DATA:  Cardiovascular Disease Risk stratification  EXAM: Coronary Calcium Score  TECHNIQUE: A gated, non-contrast computed tomography scan of the heart was performed using 3mm slice thickness. Axial images were analyzed on a dedicated workstation. Calcium scoring of the coronary arteries was performed using the Agatston method.  FINDINGS: Coronary arteries: Normal origins.  Coronary Calcium Score:  Left main:  Left anterior descending artery: 355  Left circumflex artery: 43  Right coronary artery: 199  Total: 597  Percentile: 97th  Pericardium: Normal.  Ascending Aorta: Normal caliber.  Non-cardiac: See separate report from [facility].  IMPRESSION: Coronary calcium score of 597. This was 97th percentile for age-, race-, and sex-matched controls.  RECOMMENDATIONS: Coronary artery calcium (CAC) score is a strong predictor of incident coronary heart disease (CHD) and provides predictive information beyond traditional risk factors. CAC scoring is reasonable to use in the decision to withhold, postpone, or initiate statin therapy in intermediate-risk or selected borderline-risk asymptomatic adults (age 20-75 years and LDL-C >=70 to <190 mg/dL) who do not have diabetes or established atherosclerotic cardiovascular disease (ASCVD).* In intermediate-risk (10-year ASCVD risk >=7.5% to <20%) adults or selected borderline-risk (10-year ASCVD risk >=5% to <7.5%) adults in whom a CAC score is measured for the purpose of making a treatment decision the following recommendations have been made:  If CAC=0, it is reasonable to withhold statin therapy and reassess in 5 to 10 years, as long as higher risk conditions are absent (diabetes mellitus, family history of premature CHD in first degree relatives (males <  55 years; females <65 years), cigarette smoking, or LDL >=190 mg/dL).  If CAC is 1 to 99, it is reasonable to initiate statin therapy for patients >=55 years  of age.  If CAC is >=100 or >=75th percentile, it is reasonable to initiate statin therapy at any age.  Cardiology referral should be considered for patients with CAC scores >=400 or >=75th percentile.  *2018 AHA/ACC/AACVPR/AAPA/ABC/ACPM/ADA/AGS/APhA/ASPC/NLA/PCNA Guideline on the Management of Blood Cholesterol: A Report of the American College of Cardiology/American Heart Association Task Force on Clinical Practice Guidelines. J Am Coll Cardiol. 2019;73(24):3168-3209.  Epifanio Lesches, MD   Electronically Signed By: Epifanio Lesches M.D. On: 04/21/2021 09:52  Narrative EXAM: OVER-READ INTERPRETATION  CT CHEST  The following report is an over-read performed by radiologist Dr. Jeronimo Greaves of [facility] on 04/20/2021. This over-read does not include interpretation of cardiac or coronary anatomy or pathology. The calcium score interpretation by the cardiologist is attached.  COMPARISON:  01/07/2019 chest radiograph  FINDINGS: Vascular: Normal aortic caliber.  Tortuous thoracic aorta.  Mediastinum/Nodes: No imaged thoracic adenopathy.  Lungs/Pleura: No pleural fluid.  Left upper lobe/lingular scarring.  Upper Abdomen: Surgical changes about the proximal stomach. Normal imaged portions of the liver, spleen.  Musculoskeletal: No acute osseous abnormality.  IMPRESSION: No acute findings in the imaged extracardiac chest.  Electronically Signed: By: Jeronimo Greaves M.D. On: 04/20/2021 11:24     ______________________________________________________________________________________________     Recent Labs: 01/24/2023: ALT 14; BUN 12; Creat 0.91; Hemoglobin 12.0; Platelets 279; Potassium 3.9; Sodium 142  Recent Lipid Panel    Component Value Date/Time   CHOL 146 01/24/2023 1432   CHOL 130 06/28/2021 1215   TRIG 81 01/24/2023 1432   HDL 79 01/24/2023 1432   HDL 65 06/28/2021 1215   CHOLHDL 1.8 01/24/2023 1432   VLDL 21 01/06/2016 1451    LDLCALC 51 01/24/2023 1432    History of Present Illness    68 year old female with the above past medical history including elevated coronary calcium score, family history of early CAD, hypertension, hyperlipidemia, HIV, hypothyroidism, arthritis, and obesity.  Echocardiogram in 2009 showed EF 55 to 60%, normal LV function, no RWMA. ETT in 2015 was negative for ischemia.  Coronary calcium score in 2023 was 597 (97 percentile). She was last seen in office on 11/07/2021 and was stable from a cardiac standpoint. She did note some mild dyspnea on exertion, she denied chest pain or other symptoms concerning for angina.  She presents today for follow-up accompanied by her granddaughter.  Since her last visit she has been stable overall from a cardiac standpoint.  She notes occasional mild dyspnea on exertion, overall improved.  She denies any other symptoms concerning for angina.  She has left arm numbness and tingling from her elbow to her hand, this is largely positional, and is not new. She recently underwent carpal tunnel surgery. Overall she reports feeling well.   Home Medications    Current Outpatient Medications  Medication Sig Dispense Refill   alendronate (FOSAMAX) 70 MG tablet Take 70 mg by mouth once a week.     ALPRAZolam (XANAX) 1 MG tablet Take 1 tablet (1 mg total) by mouth 3 (three) times daily. 90 tablet 0   bictegravir-emtricitabine-tenofovir AF (BIKTARVY) 50-200-25 MG TABS tablet Take 1 tablet by mouth daily. 30 tablet 5   Cholecalciferol (VITAMIN D3) 2000 units TABS Take 2,000 Units by mouth daily.      clobetasol ointment (TEMOVATE) 0.05 % Apply 1 Application topically 2 (two) times daily.  cyclobenzaprine (FLEXERIL) 10 MG tablet Take 10 mg by mouth 3 (three) times daily.     diclofenac Sodium (VOLTAREN) 1 % GEL Apply 2 g topically daily as needed (pain).     levothyroxine (SYNTHROID, LEVOTHROID) 150 MCG tablet Take 150 mcg by mouth daily before breakfast.     linaCLOtide  (LINZESS PO) Take by mouth as needed.     naloxone (NARCAN) nasal spray 4 mg/0.1 mL Spray into nostril with signs of opioid related oversedation or overdose 1 each 0   nystatin ointment (MYCOSTATIN) 1 Application daily as needed (yeast).     oxyCODONE-acetaminophen (PERCOCET) 10-325 MG tablet Take 1 tablet by mouth every 4 (four) to 6 (six) hours as needed 120 tablet 0   tiZANidine (ZANAFLEX) 4 MG tablet Take 1 tablet (4 mg total) by mouth every 8 (eight) hours as needed for muscle spasms. Do not take cyclobenzaprine while on this medication 30 tablet 0   triamcinolone cream (KENALOG) 0.1 % Apply 1 Application topically daily as needed (irritation).     acetaminophen (TYLENOL) 500 MG tablet Take 2 tablets (1,000 mg total) by mouth every 6 (six) hours. 30 tablet 0   albuterol (VENTOLIN HFA) 108 (90 Base) MCG/ACT inhaler Inhale 2 puffs into the lungs every 6 (six) hours as needed for wheezing or shortness of breath.     ALPRAZolam (XANAX) 1 MG tablet Take 1 mg by mouth 3 (three) times daily as needed for anxiety.     clobetasol cream (TEMOVATE) 0.05 % Apply 1 Application topically as needed.     cyanocobalamin (,VITAMIN B-12,) 1000 MCG/ML injection Inject 1,000 mcg into the muscle every 30 (thirty) days. (Patient not taking: Reported on 03/27/2023)     LINZESS 72 MCG capsule Take 72 mcg by mouth daily.     oxyCODONE-acetaminophen (PERCOCET) 10-325 MG tablet Take 1 tablet by mouth every 4-6 hours as needed. 120 tablet 0   rosuvastatin (CRESTOR) 40 MG tablet Take 1 tablet (40 mg total) by mouth daily. NEED OV FOR FUTURE REFILLS. (Patient not taking: Reported on 03/27/2023) 90 tablet 0   No current facility-administered medications for this visit.     Review of Systems    She denies chest pain, palpitations, dyspnea, pnd, orthopnea, n, v, dizziness, syncope, edema, weight gain, or early satiety. All other systems reviewed and are otherwise negative except as noted above.   Physical Exam    VS:  BP  118/82 (BP Location: Left Arm, Patient Position: Sitting, Cuff Size: Large)   Pulse 74   Ht 5\' 3"  (1.6 m)   Wt 262 lb 3.2 oz (118.9 kg)   LMP 04/03/2011   SpO2 97%   BMI 46.45 kg/m   GEN: Well nourished, well developed, in no acute distress. HEENT: normal. Neck: Supple, no JVD, carotid bruits, or masses. Cardiac: RRR, no murmurs, rubs, or gallops. No clubbing, cyanosis, edema.  Radials/DP/PT 2+ and equal bilaterally.  Respiratory:  Respirations regular and unlabored, clear to auscultation bilaterally. GI: Soft, nontender, nondistended, BS + x 4. MS: no deformity or atrophy. Skin: warm and dry, no rash. Neuro:  Strength and sensation are intact. Psych: Normal affect.  Accessory Clinical Findings    ECG personally reviewed by me today - EKG Interpretation Date/Time:  Tuesday March 27 2023 14:39:44 EDT Ventricular Rate:  69 PR Interval:  136 QRS Duration:  98 QT Interval:  430 QTC Calculation: 460 R Axis:   -1  Text Interpretation: Normal sinus rhythm Moderate voltage criteria for LVH, may be normal  variant ( R in aVL , Cornell product ) Nonspecific ST abnormality When compared with ECG of 27-Mar-2023 14:38, No significant change was found Confirmed by Bernadene Person ([id number]) on 03/27/2023 2:42:15 PM  - no acute changes.   Lab Results  Component Value Date   WBC 6.0 01/24/2023   HGB 12.0 01/24/2023   HCT 38.0 01/24/2023   MCV 88.6 01/24/2023   PLT 279 01/24/2023   Lab Results  Component Value Date   CREATININE 0.91 01/24/2023   BUN 12 01/24/2023   NA 142 01/24/2023   K 3.9 01/24/2023   CL 104 01/24/2023   CO2 29 01/24/2023   Lab Results  Component Value Date   ALT 14 01/24/2023   AST 18 01/24/2023   ALKPHOS 108 06/28/2021   BILITOT 0.4 01/24/2023   Lab Results  Component Value Date   CHOL 146 01/24/2023   HDL 79 01/24/2023   LDLCALC 51 01/24/2023   TRIG 81 01/24/2023   CHOLHDL 1.8 01/24/2023    Lab Results  Component Value Date   HGBA1C 5.5 03/08/2021     Assessment & Plan    1. Elevated coronary artery calcium score: Coronary calcium score in 2023 was 597 (97 percentile).  He notes occasional mild dyspnea on exertion, overall improved.  She denies any other symptoms concerning for angina.  She has left arm numbness and tingling from her elbow to her left hand, this is positional, not new, likely musculoskeletal/nerve pain (she underwent carpal tunnel release earlier this month). Stable with no anginal symptoms. No indication for ischemic evaluation. Continue Crestor.  2. Hypertension: BP well controlled. Continue current antihypertensive regimen.   3. Hyperlipidemia: LDL was 51 in 01/2023, well-controlled. Continue Crestor.   4. HIV: Following with ID.   5. Hypothyroidism: No recent TSH on file.  Monitored and managed per PCP.  On levothyroxine.  6. Disposition: Follow-up in 1 year with Dr. Servando Salina.       Joylene Grapes, NP 03/27/2023, 2:50 PM

## 2023-03-27 NOTE — Patient Instructions (Signed)
 Medication Instructions:  No changes *If you need a refill on your cardiac medications before your next appointment, please call your pharmacy*  Lab Work: No labs If you have labs (blood work) drawn today and your tests are completely normal, you will receive your results only by: MyChart Message (if you have MyChart) OR A paper copy in the mail If you have any lab test that is abnormal or we need to change your treatment, we will call you to review the results.  Testing/Procedures: No testing  Follow-Up: At [facility], you and your health needs are our priority.  As part of our continuing mission to provide you with exceptional heart care, we have created designated Provider Care Teams.  These Care Teams include your primary Cardiologist (physician) and Advanced Practice Providers (APPs -  Physician Assistants and Nurse Practitioners) who all work together to provide you with the care you need, when you need it.  We recommend signing up for the patient portal called "MyChart".  Sign up information is provided on this After Visit Summary.  MyChart is used to connect with patients for Virtual Visits (Telemedicine).  Patients are able to view lab/test results, encounter notes, upcoming appointments, etc.  Non-urgent messages can be sent to your provider as well.   To learn more about what you can do with MyChart, go to [email or web address].    Your next appointment:   1 year(s)  Provider:   Thomasene Ripple, DO   Other Instructions

## 2023-03-28 ENCOUNTER — Encounter: Payer: Self-pay | Admitting: Nurse Practitioner

## 2023-04-19 ENCOUNTER — Other Ambulatory Visit (HOSPITAL_BASED_OUTPATIENT_CLINIC_OR_DEPARTMENT_OTHER): Payer: Self-pay

## 2023-04-19 DIAGNOSIS — R269 Unspecified abnormalities of gait and mobility: Secondary | ICD-10-CM | POA: Diagnosis not present

## 2023-04-19 DIAGNOSIS — D649 Anemia, unspecified: Secondary | ICD-10-CM | POA: Diagnosis not present

## 2023-04-19 DIAGNOSIS — M5459 Other low back pain: Secondary | ICD-10-CM | POA: Diagnosis not present

## 2023-04-19 DIAGNOSIS — M25569 Pain in unspecified knee: Secondary | ICD-10-CM | POA: Diagnosis not present

## 2023-04-19 DIAGNOSIS — K219 Gastro-esophageal reflux disease without esophagitis: Secondary | ICD-10-CM | POA: Diagnosis not present

## 2023-04-19 DIAGNOSIS — E039 Hypothyroidism, unspecified: Secondary | ICD-10-CM | POA: Diagnosis not present

## 2023-04-19 DIAGNOSIS — I10 Essential (primary) hypertension: Secondary | ICD-10-CM | POA: Diagnosis not present

## 2023-04-19 MED ORDER — ALPRAZOLAM 1 MG PO TABS
1.0000 mg | ORAL_TABLET | Freq: Three times a day (TID) | ORAL | 0 refills | Status: DC
  Filled 2023-04-19: qty 90, 30d supply, fill #0

## 2023-04-19 MED ORDER — OXYCODONE-ACETAMINOPHEN 10-325 MG PO TABS
1.0000 | ORAL_TABLET | ORAL | 0 refills | Status: DC | PRN
  Filled 2023-04-19: qty 120, 20d supply, fill #0

## 2023-04-24 DIAGNOSIS — R109 Unspecified abdominal pain: Secondary | ICD-10-CM | POA: Diagnosis not present

## 2023-04-24 DIAGNOSIS — K529 Noninfective gastroenteritis and colitis, unspecified: Secondary | ICD-10-CM | POA: Diagnosis not present

## 2023-04-24 DIAGNOSIS — M79604 Pain in right leg: Secondary | ICD-10-CM | POA: Diagnosis not present

## 2023-04-24 DIAGNOSIS — M5416 Radiculopathy, lumbar region: Secondary | ICD-10-CM | POA: Diagnosis not present

## 2023-05-24 ENCOUNTER — Other Ambulatory Visit (HOSPITAL_BASED_OUTPATIENT_CLINIC_OR_DEPARTMENT_OTHER): Payer: Self-pay

## 2023-05-24 DIAGNOSIS — G4700 Insomnia, unspecified: Secondary | ICD-10-CM | POA: Diagnosis not present

## 2023-05-24 DIAGNOSIS — M5459 Other low back pain: Secondary | ICD-10-CM | POA: Diagnosis not present

## 2023-05-24 DIAGNOSIS — E039 Hypothyroidism, unspecified: Secondary | ICD-10-CM | POA: Diagnosis not present

## 2023-05-24 DIAGNOSIS — I10 Essential (primary) hypertension: Secondary | ICD-10-CM | POA: Diagnosis not present

## 2023-05-24 MED ORDER — ALPRAZOLAM 1 MG PO TABS
1.0000 mg | ORAL_TABLET | Freq: Three times a day (TID) | ORAL | 0 refills | Status: DC
  Filled 2023-05-24: qty 90, 30d supply, fill #0

## 2023-05-24 MED ORDER — OXYCODONE-ACETAMINOPHEN 10-325 MG PO TABS
1.0000 | ORAL_TABLET | ORAL | 0 refills | Status: DC | PRN
Start: 2023-05-24 — End: 2023-06-25
  Filled 2023-05-24: qty 120, 20d supply, fill #0

## 2023-05-25 NOTE — Progress Notes (Signed)
 The 10-year ASCVD risk score (Arnett DK, et al., 2019) is: 10.6%   Values used to calculate the score:     Age: 68 years     Sex: Female     Is Non-Hispanic African American: Yes     Diabetic: No     Tobacco smoker: No     Systolic Blood Pressure: 158 mmHg     Is BP treated: No     HDL Cholesterol: 79 mg/dL     Total Cholesterol: 146 mg/dL  Currently prescribed rosuvastatin  40 mg.  Darnesha Diloreto, BSN, RN

## 2023-06-25 ENCOUNTER — Other Ambulatory Visit (HOSPITAL_BASED_OUTPATIENT_CLINIC_OR_DEPARTMENT_OTHER): Payer: Self-pay

## 2023-06-25 MED ORDER — ALPRAZOLAM 1 MG PO TABS
1.0000 mg | ORAL_TABLET | Freq: Three times a day (TID) | ORAL | 0 refills | Status: DC
  Filled 2023-06-25: qty 90, 30d supply, fill #0

## 2023-06-25 MED ORDER — OXYCODONE-ACETAMINOPHEN 10-325 MG PO TABS
1.0000 | ORAL_TABLET | ORAL | 0 refills | Status: DC | PRN
  Filled 2023-06-25: qty 28, 5d supply, fill #0
  Filled 2023-07-02: qty 92, 16d supply, fill #1
  Filled 2023-07-02: qty 28, 5d supply, fill #1
  Filled 2023-07-02: qty 92, 16d supply, fill #1

## 2023-07-02 ENCOUNTER — Other Ambulatory Visit (HOSPITAL_BASED_OUTPATIENT_CLINIC_OR_DEPARTMENT_OTHER): Payer: Self-pay

## 2023-07-24 ENCOUNTER — Other Ambulatory Visit (HOSPITAL_BASED_OUTPATIENT_CLINIC_OR_DEPARTMENT_OTHER): Payer: Self-pay

## 2023-07-24 MED ORDER — OXYCODONE-ACETAMINOPHEN 10-325 MG PO TABS
1.0000 | ORAL_TABLET | ORAL | 0 refills | Status: DC | PRN
  Filled 2023-07-24 (×2): qty 120, 20d supply, fill #0

## 2023-07-24 MED ORDER — ALPRAZOLAM 1 MG PO TABS
1.0000 mg | ORAL_TABLET | Freq: Three times a day (TID) | ORAL | 0 refills | Status: DC
  Filled 2023-07-24 (×2): qty 90, 30d supply, fill #0

## 2023-08-07 ENCOUNTER — Telehealth: Payer: Self-pay

## 2023-08-07 DIAGNOSIS — B20 Human immunodeficiency virus [HIV] disease: Secondary | ICD-10-CM

## 2023-08-07 MED ORDER — BIKTARVY 50-200-25 MG PO TABS: 1.0000 | ORAL_TABLET | Freq: Every day | ORAL | 2 refills | Status: DC

## 2023-08-07 NOTE — Telephone Encounter (Signed)
 Patient called requesting refill of Biktarvy , she will call back to schedule with a new provider once she has her calendar available.   Miranda Kelley, BSN, RN

## 2023-08-14 ENCOUNTER — Encounter: Payer: Self-pay | Admitting: Physician Assistant

## 2023-08-28 ENCOUNTER — Other Ambulatory Visit (HOSPITAL_BASED_OUTPATIENT_CLINIC_OR_DEPARTMENT_OTHER): Payer: Self-pay

## 2023-08-28 ENCOUNTER — Other Ambulatory Visit: Payer: Self-pay

## 2023-08-28 MED ORDER — OXYCODONE-ACETAMINOPHEN 10-325 MG PO TABS
1.0000 | ORAL_TABLET | ORAL | 0 refills | Status: DC | PRN
  Filled 2023-08-28 (×2): qty 120, 20d supply, fill #0

## 2023-08-28 MED ORDER — ALPRAZOLAM 1 MG PO TABS
1.0000 mg | ORAL_TABLET | Freq: Three times a day (TID) | ORAL | 0 refills | Status: DC
  Filled 2023-08-28 (×2): qty 90, 30d supply, fill #0

## 2023-09-05 ENCOUNTER — Emergency Department (HOSPITAL_BASED_OUTPATIENT_CLINIC_OR_DEPARTMENT_OTHER)

## 2023-09-05 ENCOUNTER — Emergency Department (HOSPITAL_BASED_OUTPATIENT_CLINIC_OR_DEPARTMENT_OTHER)
Admission: EM | Admit: 2023-09-05 | Discharge: 2023-09-05 | Disposition: A | Discharge status: Home / Self Care | Attending: Emergency Medicine | Admitting: Emergency Medicine

## 2023-09-05 ENCOUNTER — Other Ambulatory Visit: Payer: Self-pay

## 2023-09-05 DIAGNOSIS — M5416 Radiculopathy, lumbar region: Secondary | ICD-10-CM | POA: Diagnosis not present

## 2023-09-05 DIAGNOSIS — M5410 Radiculopathy, site unspecified: Secondary | ICD-10-CM

## 2023-09-05 DIAGNOSIS — K5900 Constipation, unspecified: Secondary | ICD-10-CM | POA: Diagnosis present

## 2023-09-05 LAB — COMPREHENSIVE METABOLIC PANEL WITH GFR
ALT: 23 U/L (ref 0–44)
AST: 23 U/L (ref 15–41)
Albumin: 4.2 g/dL (ref 3.5–5.0)
Alkaline Phosphatase: 89 U/L (ref 38–126)
Anion gap: 12 (ref 5–15)
BUN: 27 mg/dL — ABNORMAL HIGH (ref 8–23)
CO2: 23 mmol/L (ref 22–32)
Calcium: 10.5 mg/dL — ABNORMAL HIGH (ref 8.9–10.3)
Chloride: 105 mmol/L (ref 98–111)
Creatinine, Ser: 1.11 mg/dL — ABNORMAL HIGH (ref 0.44–1.00)
GFR, Estimated: 54 mL/min — ABNORMAL LOW (ref >60)
Glucose, Bld: 88 mg/dL (ref 70–99)
Potassium: 4 mmol/L (ref 3.5–5.1)
Sodium: 139 mmol/L (ref 135–145)
Total Bilirubin: 0.3 mg/dL (ref 0.0–1.2)
Total Protein: 7.3 g/dL (ref 6.5–8.1)

## 2023-09-05 LAB — CBC WITH DIFFERENTIAL/PLATELET
Abs Immature Granulocytes: 0.01 K/uL (ref 0.00–0.07)
Basophils Absolute: 0 K/uL (ref 0.0–0.1)
Basophils Relative: 0 %
Eosinophils Absolute: 0.1 K/uL (ref 0.0–0.5)
Eosinophils Relative: 2 %
HCT: 36.3 % (ref 36.0–46.0)
Hemoglobin: 11.3 g/dL — ABNORMAL LOW (ref 12.0–15.0)
Immature Granulocytes: 0 %
Lymphocytes Relative: 45 %
Lymphs Abs: 3.3 K/uL (ref 0.7–4.0)
MCH: 28.6 pg (ref 26.0–34.0)
MCHC: 31.1 g/dL (ref 30.0–36.0)
MCV: 91.9 fL (ref 80.0–100.0)
Monocytes Absolute: 0.5 K/uL (ref 0.1–1.0)
Monocytes Relative: 7 %
Neutro Abs: 3.4 K/uL (ref 1.7–7.7)
Neutrophils Relative %: 46 %
Platelets: 282 K/uL (ref 150–400)
RBC: 3.95 MIL/uL (ref 3.87–5.11)
RDW: 14.6 % (ref 11.5–15.5)
WBC: 7.4 K/uL (ref 4.0–10.5)
nRBC: 0 % (ref 0.0–0.2)

## 2023-09-05 LAB — MAGNESIUM: Magnesium: 2.3 mg/dL (ref 1.7–2.4)

## 2023-09-05 LAB — C-REACTIVE PROTEIN: CRP: 0.6 mg/dL (ref <1.0)

## 2023-09-05 LAB — SEDIMENTATION RATE: Sed Rate: 15 mm/h (ref 0–22)

## 2023-09-05 MED ORDER — IOHEXOL 300 MG/ML  SOLN
100.0000 mL | Freq: Once | INTRAMUSCULAR | Status: AC | PRN
  Administered 2023-09-05: 100 mL via INTRAVENOUS

## 2023-09-05 NOTE — ED Notes (Addendum)
 Patient transported to CT

## 2023-09-05 NOTE — ED Triage Notes (Signed)
 Patient reports constipation for over a month. States taking dulcolax and other OTC stool softeners without relief. States what does come out looks like diarrhea Denies abd pain

## 2023-09-05 NOTE — ED Notes (Signed)
 Spoke with lab about blood samples. Lab reports they are in process at this time

## 2023-09-05 NOTE — ED Provider Notes (Signed)
 Miranda Kelley EMERGENCY DEPARTMENT AT [facility] Provider Note   CSN: [id number] Arrival date & time: 09/05/23  [id number]     Patient presents with: Constipation   Miranda Kelley is a 68 y.o. female.   68 yo F with a cc of a feeling like she is constipated.  Been going on for about a month now.  Feels like there is something hard but she cannot quite get out.  She has been taking laxatives at home and has had some output that is all liquid.  She said she had some solid stool a couple days ago but still feels like something stuck down there.  She has been taking pain medicine for right hip pain.  Says it occurs in her buttocks and goes down the leg.  She feels like part of that foot is numb.  She is also been having some trouble urinating.  No fevers no trauma.   Constipation      Prior to Admission medications   Medication Sig Start Date End Date Taking? Authorizing Provider  acetaminophen  (TYLENOL ) 500 MG tablet Take 2 tablets (1,000 mg total) by mouth every 6 (six) hours. 05/17/22   Patti Rosina SAUNDERS, PA-C  albuterol  (VENTOLIN  HFA) 108 (90 Base) MCG/ACT inhaler Inhale 2 puffs into the lungs every 6 (six) hours as needed for wheezing or shortness of breath.    [provider]  alendronate (FOSAMAX) 70 MG tablet Take 70 mg by mouth once a week. 03/12/22   [provider]  ALPRAZolam  (XANAX ) 1 MG tablet Take 1 mg by mouth 3 (three) times daily as needed for anxiety. 11/07/18   [provider]  ALPRAZolam  (XANAX ) 1 MG tablet Take 1 tablet (1 mg total) by mouth 3 (three) times daily. 08/28/23     bictegravir-emtricitabine-tenofovir AF (BIKTARVY ) 50-200-25 MG TABS tablet Take 1 tablet by mouth daily. 08/07/23   Waddell Alan PARAS, RPH-CPP  Cholecalciferol (VITAMIN D3) 2000 units TABS Take 2,000 Units by mouth daily.     [provider]  clobetasol  cream (TEMOVATE ) 0.05 % Apply 1 Application topically as needed.    [provider]  clobetasol   ointment (TEMOVATE ) 0.05 % Apply 1 Application topically 2 (two) times daily. 02/22/12   [provider]  cyanocobalamin  (,VITAMIN B-12,) 1000 MCG/ML injection Inject 1,000 mcg into the muscle every 30 (thirty) days. Patient not taking: Reported on 03/27/2023    [provider]  cyclobenzaprine  (FLEXERIL ) 10 MG tablet Take 10 mg by mouth 3 (three) times daily. 03/12/23   [provider]  diclofenac  Sodium (VOLTAREN ) 1 % GEL Apply 2 g topically daily as needed (pain).    [provider]  levothyroxine  (SYNTHROID , LEVOTHROID) 150 MCG tablet Take 150 mcg by mouth daily before breakfast.    [provider]  linaCLOtide (LINZESS PO) Take by mouth as needed.    [provider]  LINZESS 72 MCG capsule Take 72 mcg by mouth daily.    [provider]  naloxone  (NARCAN ) nasal spray 4 mg/0.1 mL Spray into nostril with signs of opioid related oversedation or overdose 05/17/22   Patti Rosina SAUNDERS, PA-C  nystatin  ointment (MYCOSTATIN ) 1 Application daily as needed (yeast). 01/21/19   [provider]  oxyCODONE -acetaminophen  (PERCOCET) 10-325 MG tablet Take 1 tablet by mouth every 4-6 hours as needed. 02/20/23     oxyCODONE -acetaminophen  (PERCOCET) 10-325 MG tablet Take 1 tablet by mouth every 4 (four) to 6 (six) hours as needed 03/19/23     oxyCODONE -acetaminophen  (PERCOCET)  10-325 MG tablet Take 1 tablet by mouth every 4 (hour) to 6 (hour) as needed. 08/28/23     rosuvastatin  (CRESTOR ) 40 MG tablet Take 1 tablet (40 mg total) by mouth daily. 03/27/23   Daneen Damien BROCKS, NP  tiZANidine  (ZANAFLEX ) 4 MG tablet Take 1 tablet (4 mg total) by mouth every 8 (eight) hours as needed for muscle spasms. Do not take cyclobenzaprine  while on this medication 05/17/22   Patti Rosina SAUNDERS, PA-C  triamcinolone cream (KENALOG) 0.1 % Apply 1 Application topically daily as needed (irritation).    [provider]    Allergies: Patient has no known allergies.     Review of Systems  Gastrointestinal:  Positive for constipation.    Updated Vital Signs BP 119/81   Pulse 60   Temp 98 F (36.7 C)   Resp 20   LMP 04/03/2011   SpO2 99%   Physical Exam Vitals and nursing note reviewed.  Constitutional:      General: She is not in acute distress.    Appearance: She is well-developed. She is not diaphoretic.  HENT:     Head: Normocephalic and atraumatic.  Eyes:     Pupils: Pupils are equal, round, and reactive to light.  Cardiovascular:     Rate and Rhythm: Normal rate and regular rhythm.     Heart sounds: No murmur heard.    No friction rub. No gallop.  Pulmonary:     Effort: Pulmonary effort is normal.     Breath sounds: No wheezing or rales.  Abdominal:     General: There is no distension.     Palpations: Abdomen is soft.     Tenderness: There is no abdominal tenderness.  Genitourinary:    Comments: No obvious hemorrhoids or fissures.  No stool in the vault. Musculoskeletal:        General: No tenderness.     Cervical back: Normal range of motion and neck supple.     Comments: Pulse motor and sensation intact bilateral lower extremities.  Reflexes are slightly diminished in the right lower extremity compared to left 1+ compared to 2+.  No clonus.  Skin:    General: Skin is warm and dry.  Neurological:     Mental Status: She is alert and oriented to person, place, and time.  Psychiatric:        Behavior: Behavior normal.     (all labs ordered are listed, but only abnormal results are displayed) Labs Reviewed  CBC WITH DIFFERENTIAL/PLATELET - Abnormal; Notable for the following components:      Result Value   Hemoglobin 11.3 (*)    All other components within normal limits  COMPREHENSIVE METABOLIC PANEL WITH GFR - Abnormal; Notable for the following components:   BUN 27 (*)    Creatinine, Ser 1.11 (*)    Calcium  10.5 (*)    GFR, Estimated 54 (*)    All other components within normal limits  MAGNESIUM   SEDIMENTATION RATE   C-REACTIVE PROTEIN    EKG: None  Radiology: CT ABDOMEN PELVIS W CONTRAST Result Date: 09/05/2023 CLINICAL DATA:  Constipation. EXAM: CT ABDOMEN AND PELVIS WITH CONTRAST TECHNIQUE: Multidetector CT imaging of the abdomen and pelvis was performed using the standard protocol following bolus administration of intravenous contrast. RADIATION DOSE REDUCTION: This exam was performed according to the departmental dose-optimization program which includes automated exposure control, adjustment of the mA and/or kV according to patient size and/or use of iterative reconstruction technique. CONTRAST:  OMNIPAQUE  IOHEXOL  300  MG/ML  SOLN COMPARISON:  CT abdomen and pelvis 06/04/2011. FINDINGS: Lower chest: No acute abnormality. Hepatobiliary: No focal liver abnormality is seen. Status post cholecystectomy. No biliary dilatation. Pancreas: Unremarkable. No pancreatic ductal dilatation or surrounding inflammatory changes. Spleen: Normal in size without focal abnormality. Adrenals/Urinary Tract: Adrenal glands are unremarkable. Kidneys are normal, without renal calculi, focal lesion, or hydronephrosis. Bladder is unremarkable. Stomach/Bowel: There are postsurgical changes in the stomach. Appendix appears normal. No evidence of bowel wall thickening, distention, or inflammatory changes. There is average stool burden. Vascular/Lymphatic: Aortic atherosclerosis. No enlarged abdominal or pelvic lymph nodes. Reproductive: Uterus and bilateral adnexa are unremarkable. Other: No abdominal wall hernia or abnormality. No abdominopelvic ascites. Musculoskeletal: Degenerative changes affect the spine. Right hip arthroplasty is present. IMPRESSION: 1. No acute localizing process in the abdomen or pelvis. 2. Postsurgical changes in the stomach. 3. Aortic atherosclerosis. Aortic Atherosclerosis (ICD10-I70.0). Electronically Signed   By: Greig Pique M.D.   On: 09/05/2023 21:37     Procedures   Medications Ordered in the ED   iohexol  (OMNIPAQUE ) 300 MG/ML solution 100 mL (100 mLs Intravenous Contrast Given 09/05/23 2051)                                    Medical Decision Making Amount and/or Complexity of Data Reviewed Labs: ordered. Radiology: ordered.  Risk Prescription drug management.   68 yo F with a chief complaints of constipation.  Patient says she feels like something is in her rectum that she cannot get out.  Has been going on for about a month.  Has been taking laxatives and stool softeners and has been having some soft stool come out but no hard stool.  She has also been incontinent of stool.  Has had difficulty urinating as well.  I discussed my concern about possible cauda equina syndrome with difficulty urinating bowel incontinence and right leg numbness for the patient.  She is willing to have an MRI performed today.  Unfortunately MRI has left for the day.  I did talk to the patient about being transferred for MRI which she initially was okay with but on repeat assessment the patient would prefer to go home at this time.  I discussed my concern about possible spinal cord compression.  Encouraged her to follow-up with her orthopedist and family doctor.  9:53 PM:  I have discussed the diagnosis/risks/treatment options with the patient.  Evaluation and diagnostic testing in the emergency department does not suggest an emergent condition requiring admission or immediate intervention beyond what has been performed at this time.  They will follow up with PCP, ortho. We also discussed returning to the ED immediately if new or worsening sx occur. We discussed the sx which are most concerning (e.g., sudden worsening pain, fever, inability to tolerate by mouth, cauda equina s/sx) that necessitate immediate return. Medications administered to the patient during their visit and any new prescriptions provided to the patient are listed below.  Medications given during this visit Medications  iohexol   (OMNIPAQUE ) 300 MG/ML solution 100 mL (100 mLs Intravenous Contrast Given 09/05/23 2051)     The patient appears reasonably screen and/or stabilized for discharge and I doubt any other medical condition or other [facility] requiring further screening, evaluation, or treatment in the ED at this time prior to discharge.       Final diagnoses:  Constipation, unspecified constipation type  Radicular low back pain  ED Discharge Orders     None          Emil Share, [place] 09/05/23 2153

## 2023-09-05 NOTE — Discharge Instructions (Addendum)
 Take 8 scoops of miralax  in 32oz of whatever you would like to drink.(Gatorade comes in this size) You can also use a fleets enema which you can buy over the counter at the pharmacy.  Return for worsening abdominal pain, vomiting or fever.  As we discussed I am little worried that your symptoms are coming from your back.  We talked about getting an MRI today.  Please return anytime you would like to have this reevaluated.  If you do have compression of your spinal cord your difficulty urinating and moving your bowels could get worse and could make your legs progressively weak.  Please mention this to your orthopedist and your family doctor.  If able to get your back then the gastroenterology specialist might need to take a look and make sure that you do not have something wrong on colonoscopy.

## 2023-09-05 NOTE — ED Notes (Signed)
 Reviewed discharge instructions and home care with pt. Pt verbalized understanding and had no further questions. Pt exited ED without complications.

## 2023-09-27 ENCOUNTER — Other Ambulatory Visit (HOSPITAL_BASED_OUTPATIENT_CLINIC_OR_DEPARTMENT_OTHER): Payer: Self-pay

## 2023-09-27 MED ORDER — OXYCODONE-ACETAMINOPHEN 10-325 MG PO TABS
1.0000 | ORAL_TABLET | ORAL | 0 refills | Status: DC | PRN
  Filled 2023-09-27 (×2): qty 120, 20d supply, fill #0

## 2023-09-27 MED ORDER — ALPRAZOLAM 1 MG PO TABS
1.0000 mg | ORAL_TABLET | Freq: Three times a day (TID) | ORAL | 0 refills | Status: DC
  Filled 2023-09-27 (×2): qty 90, 30d supply, fill #0

## 2023-10-01 ENCOUNTER — Other Ambulatory Visit (HOSPITAL_COMMUNITY): Payer: Self-pay

## 2023-10-04 ENCOUNTER — Encounter: Payer: Self-pay | Admitting: Physician Assistant

## 2023-10-04 ENCOUNTER — Ambulatory Visit
Admission: RE | Admit: 2023-10-04 | Discharge: 2023-10-04 | Disposition: A | Source: Ambulatory Visit | Attending: Physician Assistant | Admitting: Physician Assistant

## 2023-10-04 ENCOUNTER — Other Ambulatory Visit (INDEPENDENT_AMBULATORY_CARE_PROVIDER_SITE_OTHER)

## 2023-10-04 ENCOUNTER — Ambulatory Visit (INDEPENDENT_AMBULATORY_CARE_PROVIDER_SITE_OTHER): Admitting: Physician Assistant

## 2023-10-04 VITALS — BP 110/70 | HR 84 | Ht 63.0 in | Wt 248.2 lb

## 2023-10-04 DIAGNOSIS — B20 Human immunodeficiency virus [HIV] disease: Secondary | ICD-10-CM

## 2023-10-04 DIAGNOSIS — E538 Deficiency of other specified B group vitamins: Secondary | ICD-10-CM | POA: Diagnosis not present

## 2023-10-04 DIAGNOSIS — D649 Anemia, unspecified: Secondary | ICD-10-CM | POA: Diagnosis not present

## 2023-10-04 DIAGNOSIS — K5904 Chronic idiopathic constipation: Secondary | ICD-10-CM

## 2023-10-04 LAB — CBC WITH DIFFERENTIAL/PLATELET
Basophils Absolute: 0 K/uL (ref 0.0–0.1)
Basophils Relative: 0.5 % (ref 0.0–3.0)
Eosinophils Absolute: 0.1 K/uL (ref 0.0–0.7)
Eosinophils Relative: 1.6 % (ref 0.0–5.0)
HCT: 37.5 % (ref 36.0–46.0)
Hemoglobin: 11.9 g/dL — ABNORMAL LOW (ref 12.0–15.0)
Lymphocytes Relative: 46.6 % — ABNORMAL HIGH (ref 12.0–46.0)
Lymphs Abs: 2.8 K/uL (ref 0.7–4.0)
MCHC: 31.7 g/dL (ref 30.0–36.0)
MCV: 90.3 fl (ref 78.0–100.0)
Monocytes Absolute: 0.4 K/uL (ref 0.1–1.0)
Monocytes Relative: 6.4 % (ref 3.0–12.0)
Neutro Abs: 2.7 K/uL (ref 1.4–7.7)
Neutrophils Relative %: 44.9 % (ref 43.0–77.0)
Platelets: 268 K/uL (ref 150.0–400.0)
RBC: 4.15 Mil/uL (ref 3.87–5.11)
RDW: 14.6 % (ref 11.5–15.5)
WBC: 6.1 K/uL (ref 4.0–10.5)

## 2023-10-04 LAB — COMPREHENSIVE METABOLIC PANEL WITH GFR
ALT: 17 U/L (ref 0–35)
AST: 17 U/L (ref 0–37)
Albumin: 4.1 g/dL (ref 3.5–5.2)
Alkaline Phosphatase: 72 U/L (ref 39–117)
BUN: 16 mg/dL (ref 6–23)
CO2: 27 meq/L (ref 19–32)
Calcium: 10.7 mg/dL — ABNORMAL HIGH (ref 8.4–10.5)
Chloride: 105 meq/L (ref 96–112)
Creatinine, Ser: 0.83 mg/dL (ref 0.40–1.20)
GFR: 72.64 mL/min (ref >60.00)
Glucose, Bld: 96 mg/dL (ref 70–99)
Potassium: 3.7 meq/L (ref 3.5–5.1)
Sodium: 139 meq/L (ref 135–145)
Total Bilirubin: 0.3 mg/dL (ref 0.2–1.2)
Total Protein: 7.6 g/dL (ref 6.0–8.3)

## 2023-10-04 LAB — IBC + FERRITIN
Ferritin: 30.7 ng/mL (ref 10.0–291.0)
Iron: 42 ug/dL (ref 42–145)
Saturation Ratios: 11 % — ABNORMAL LOW (ref 20.0–50.0)
TIBC: 382.2 ug/dL (ref 250.0–450.0)
Transferrin: 273 mg/dL (ref 212.0–360.0)

## 2023-10-04 LAB — VITAMIN B12: Vitamin B-12: >1500 pg/mL — ABNORMAL HIGH (ref 211–911)

## 2023-10-04 MED ORDER — POLYETHYLENE GLYCOL 3350 17 GM/SCOOP PO POWD: 238.0000 g | ORAL | 0 refills | Status: DC

## 2023-10-04 NOTE — Progress Notes (Addendum)
 11/15/2023 Miranda Kelley [id number] 24-Oct-1955  Referring provider: Ilah Crigler, MD Primary GI doctor: Dr. Legrand  ASSESSMENT AND PLAN:  Constipation long time however worse last 2 months  feels she is sitting on stool, has urgency, encopresis 09/05/2023 CTAP W constipation ER postsurgical change in the stomach unremarkable bowel average stool burden unremarkable liver pancreas spleen 09/22/2023 sed rate CRP negative, CBC without leukocytosis mild normocytic anemia Hgb 11.3, mild hypercalcemia 10.5 normal sugar oxycodone  10 mg 4 x a day + FOBT but more soft stool in the rectum, suspicious of pelvic floor issues, rule out colitis, opioid constipation, etc - Increase fiber/ water intake, decrease caffeine, increase activity level. -Will get KUB, consider bowel purge/enemas, amitiza -Will add on Miralax  daily and Benefiber -possible component of pelvis floor dysfunction with history and symptoms. consider pelvic floor PT -Consider anal manometry.  - Due to positive FOBT and iron deficiency anemia on labs patient set up for colonoscopy with Dr. Legrand.We have discussed the risks of bleeding, infection, perforation, medication reactions, and remote risk of death associated with colonoscopy. All questions were answered and the patient acknowledges these risk and wishes to proceed.  Normocytic anemia 09/05/2023  HGB 11.3 MCV 91.9 Platelets 282 Recent Labs    01/24/23 1432 09/05/23 2005 10/04/23 1623  HGB 12.0 11.3* 11.9*  Possibly due to gastric sleeve, anemia of chronic disease -Check B12, iron, Ferritin  Abnormal urine x 1 month Negative urinary test Took ABX that may have helped Feels heaviness in vagina -Refer to GYN for evaluation of prolapse  GERD s/p gastric sleeve S/p cholecystectomy Well controlled at this time  Screening colonoscopy 03/04/2015 colonoscopy excellent prep completely unremarkable recall 10 years  HIV On BIKTARVY  Follows yearly with ID  CAD Coronary  calcium  score elevated 04/22/2021 DOE with stairs, no chest pain, diaphoresis  Follows with Dr. Victory Kelley  CKD stage II BUN 27 Cr 1.11  GFR 54  Potassium 4.0  Magnesium  2.3   Morbid obesity  Body mass index is 43.98 kg/m.  -Patient has been advised to make an attempt to improve diet and exercise patterns to aid in weight loss. -Recommended diet heavy in fruits and veggies and low in animal meats, cheeses, and dairy products, appropriate calorie intake  Patient Care Team: Miranda Crigler, MD as PCP - General (Family Medicine) Miranda Kelley LABOR, NP (Inactive) as PCP - Infectious Diseases (Infectious Diseases) Kelley, Kardie, DO as PCP - Cardiology (Cardiology)  HISTORY OF PRESENT ILLNESS: 68 y.o. female with a past medical history listed below presents for evaluation of constipation.   Patient last seen at the [facility] by Dr. Legrand for unremarkable screening colonoscopy March 2017.  Discussed the use of AI scribe software for clinical note transcription with the patient, who gave verbal consent to proceed.  History of Present Illness   Miranda Kelley is a 68 year old female with chronic constipation who presents with worsening symptoms over the past two months.  She has experienced significant constipation for the past two months, describing a sensation of being 'backed up' and feeling like she is 'sitting on a stool.' Bowel movements have been infrequent, with the last significant movement occurring after consuming alcohol, resulting in a small amount of hard stool followed by liquid stool. She has been wearing pull-ups at night due to incontinence and often has accidents before reaching the bathroom.  Her constipation has been a long-standing issue. She has been taking oxycodone  since 2023 at a dose of 10 mg daily. The medication  helps her walk with a cane. She has tried various treatments, including Topamax, Linzess, and Docolax, but reports minimal relief, with stool often remaining hard  or only passing as liquid around the blockage.  She also reports urinary symptoms, including a sensation of a urinary tract infection, with feelings of swelling and tightness in her kidneys, and a need to 'tinker' rather than urinate fully. These symptoms began about a month ago, and although antibiotics provided some relief, she continues to experience urinary incontinence at night.  No fever or chills. She has noticed some blood on toilet paper, which she attributes to wiping. She experiences heartburn, which is currently not well-controlled, and denies nausea, vomiting, or difficulty swallowing. She reports shortness of breath with exertion but denies chest pain and sweating with exertion.        She  reports that she has never smoked. She has never used smokeless tobacco. She reports current alcohol use. She reports current drug use.  RELEVANT GI HISTORY, IMAGING AND LABS: Results   LABS Fecal Occult Blood: Positive (10/04/2023) CBC: Normocytic anemia (09/05/2023)  RADIOLOGY Abdominal CT: Normal stomach, no bowel wall thickening, average stool burden (09/05/2023)  DIAGNOSTIC Colonoscopy: Normal, no diverticulosis (March 2017)      CBC    Component Value Date/Time   WBC 6.1 10/04/2023 1623   RBC 4.15 10/04/2023 1623   HGB 11.9 (L) 10/04/2023 1623   HCT 37.5 10/04/2023 1623   PLT 268.0 10/04/2023 1623   MCV 90.3 10/04/2023 1623   MCH 28.6 09/05/2023 2005   MCHC 31.7 10/04/2023 1623   RDW 14.6 10/04/2023 1623   LYMPHSABS 2.8 10/04/2023 1623   MONOABS 0.4 10/04/2023 1623   EOSABS 0.1 10/04/2023 1623   BASOSABS 0.0 10/04/2023 1623   Recent Labs    01/24/23 1432 09/05/23 2005 10/04/23 1623  HGB 12.0 11.3* 11.9*    CMP     Component Value Date/Time   NA 139 10/22/2023 1033   K 4.1 10/22/2023 1033   CL 105 10/22/2023 1033   CO2 20 10/22/2023 1033   GLUCOSE 97 10/22/2023 1033   GLUCOSE 96 10/04/2023 1623   BUN 13 10/22/2023 1033   CREATININE 0.99 10/22/2023 1033    CREATININE 0.91 01/24/2023 1432   CALCIUM  10.2 10/22/2023 1033   PROT 6.9 10/22/2023 1033   ALBUMIN 4.1 10/22/2023 1033   AST 23 10/22/2023 1033   ALT 16 10/22/2023 1033   ALKPHOS 88 10/22/2023 1033   BILITOT 0.2 10/22/2023 1033   GFRNONAA 54 (L) 09/05/2023 2005   GFRNONAA 62 03/10/2020 1135   GFRAA 72 03/10/2020 1135      Latest Ref Rng & Units 10/22/2023   10:33 AM 10/04/2023    4:23 PM 09/05/2023    8:05 PM  Hepatic Function  Total Protein 6.0 - 8.5 g/dL 6.9  7.6  7.3   Albumin 3.9 - 4.9 g/dL 4.1  4.1  4.2   AST 0 - 40 IU/L 23  17  23    ALT 0 - 32 IU/L 16  17  23    Alk Phosphatase 49 - 135 IU/L 88  72  89   Total Bilirubin 0.0 - 1.2 mg/dL 0.2  0.3  0.3       Current Medications:   Current Outpatient Medications (Endocrine & Metabolic):    alendronate (FOSAMAX) 70 MG tablet, Take 70 mg by mouth once a week.   levothyroxine  (SYNTHROID , LEVOTHROID) 150 MCG tablet, Take 150 mcg by mouth daily before breakfast.  Current Outpatient Medications (  Cardiovascular):    rosuvastatin  (CRESTOR ) 40 MG tablet, Take 1 tablet (40 mg total) by mouth daily.   ezetimibe (ZETIA) 10 MG tablet, Take 1 tablet (10 mg total) by mouth daily.   metoprolol tartrate (LOPRESSOR) 25 MG tablet, Take 0.5 tablets (12.5 mg total) by mouth 2 (two) times daily.  Current Outpatient Medications (Respiratory):    albuterol  (VENTOLIN  HFA) 108 (90 Base) MCG/ACT inhaler, Inhale 2 puffs into the lungs every 6 (six) hours as needed for wheezing or shortness of breath.  Current Outpatient Medications (Analgesics):    acetaminophen  (TYLENOL ) 500 MG tablet, Take 2 tablets (1,000 mg total) by mouth every 6 (six) hours.   aspirin  81 MG chewable tablet, Chew 81 mg by mouth daily.   diclofenac  (VOLTAREN ) 75 MG EC tablet, Take 75 mg by mouth 2 (two) times daily.   oxyCODONE -acetaminophen  (PERCOCET) 10-325 MG tablet, Take 1 tablet by mouth every 4 to 6 hours as needed.  Current Outpatient Medications (Hematological):     cyanocobalamin  (,VITAMIN B-12,) 1000 MCG/ML injection, Inject 1,000 mcg into the muscle every 30 (thirty) days.  Current Outpatient Medications (Other):    bictegravir-emtricitabine-tenofovir AF (BIKTARVY ) 50-200-25 MG TABS tablet, Take 1 tablet by mouth daily.   Cholecalciferol (VITAMIN D3) 2000 units TABS, Take 2,000 Units by mouth daily.    clobetasol  cream (TEMOVATE ) 0.05 %, Apply 1 Application topically as needed.   cyclobenzaprine  (FLEXERIL ) 10 MG tablet, Take 10 mg by mouth 3 (three) times daily.   diclofenac  Sodium (VOLTAREN ) 1 % GEL, Apply 2 g topically daily as needed (pain).   LINZESS 72 MCG capsule, Take 72 mcg by mouth daily.   tiZANidine  (ZANAFLEX ) 4 MG tablet, Take 1 tablet (4 mg total) by mouth every 8 (eight) hours as needed for muscle spasms. Do not take cyclobenzaprine  while on this medication   triamcinolone cream (KENALOG) 0.1 %, Apply 1 Application topically daily as needed (irritation).   ALPRAZolam  (XANAX ) 1 MG tablet, Take 1 tablet (1 mg total) by mouth 3 (three) times daily.   GAVILAX 17 GM/SCOOP powder, TAKE 238 G BY MOUTH AS DIRECTED. FOR COLONOSCOPY   linaCLOtide (LINZESS PO), Take by mouth as needed.   naloxone  (NARCAN ) nasal spray 4 mg/0.1 mL, Spray into nostril with signs of opioid related oversedation or overdose  Medical History:  Past Medical History:  Diagnosis Date   Anxiety    Arthritis    knees   Cataract    surgery - bilateral   Chronic sinus infection    Dyspnea    HIV infection (HCC)    Hypertension    hx - no longer a problem - weight loss surgery -lost 100lbs   Hypothyroidism    Pneumonia    Thyroid  disease    Allergies: No Known Allergies   Surgical History:  She  has a past surgical history that includes Joint replacement (06/02/2009); Cholecystectomy (06/05/2011); Laparoscopic gastric sleeve resection (01/02/2013); Total hip arthroplasty (Right); cataract surgery (Bilateral); Total knee arthroplasty (Left, 05/16/2022); and Carpal  tunnel release (Left, 03/05/2023). Family History:  Her family history includes Arthritis in her sister; Breast cancer in her maternal grandmother and mother; Colon cancer in her father; Diabetes in her maternal grandfather and mother; Gout in her brother; Heart attack in her brother and sister; Heart disease in her mother; Heart failure in her mother; Kidney failure in her mother.  REVIEW OF SYSTEMS  : All other systems reviewed and negative except where noted in the History of Present Illness.  PHYSICAL EXAM: BP 110/70 (  BP Location: Left Arm, Patient Position: Sitting, Cuff Size: Large)   Pulse 84   Ht 5' 3 (1.6 m) Comment: height measured without shoes  Wt 248 lb 4 oz (112.6 kg)   LMP 04/03/2011   BMI 43.98 kg/m  Physical Exam   GENERAL APPEARANCE: Well nourished, in no apparent distress HEENT: No cervical lymphadenopathy, unremarkable thyroid , sclerae anicteric, conjunctiva pink RESPIRATORY: Respiratory effort normal, BS equal bilateral without rales, rhonchi, wheezing CARDIO: RRR with no MRGs, peripheral pulses intact ABDOMEN: Soft, non distended, active bowel sounds in all 4 quadrants, no tenderness to palpation, no rebound, no mass appreciated RECTAL: No external hemorrhoids, no fecal impaction, soft stool present, hemoccult positive, no masses or bumps MUSCULOSKELETAL: Full ROM, normal gait, without edema SKIN: Dry, intact without rashes or lesions. No jaundice. NEURO: Alert, oriented, no focal deficits PSYCH: Cooperative, normal mood and affect.      Alan JONELLE Coombs, PA-C 7:31 AM

## 2023-10-04 NOTE — Addendum Note (Signed)
 Addended by: KATHIE BOTTCHER E on: 10/04/2023 05:07 PM   Modules accepted: Orders

## 2023-10-04 NOTE — Patient Instructions (Addendum)
 Your provider has requested that you have an abdominal x ray before leaving today. Please go to the basement floor to our Radiology department for the test.  Your provider has requested that you go to the basement level for lab work before leaving today. Press B on the elevator. The lab is located at the first door on the left as you exit the elevator.  You have been scheduled for a colonoscopy. Please follow written instructions given to you at your visit today.   If you use inhalers (even only as needed), please bring them with you on the day of your procedure.  DO NOT TAKE 7 DAYS PRIOR TO TEST- Trulicity (dulaglutide) Ozempic, Wegovy (semaglutide) Mounjaro (tirzepatide) Bydureon Bcise (exanatide extended release)  DO NOT TAKE 1 DAY PRIOR TO YOUR TEST Rybelsus (semaglutide) Adlyxin (lixisenatide) Victoza (liraglutide) Byetta (exanatide) ___________________________________________________________________________    FIBER SUPPLEMENT You can do metamucil or fibercon once or twice a day but if this causes gas/bloating please switch to Benefiber or Citracel.  Fiber is good for constipation/diarrhea/irritable bowel syndrome.  It can also help with weight loss and can help lower your bad cholesterol (LDL).  Please do 1 TBSP in the morning in water, coffee, or tea.  It can take up to a month before you can see a difference with your bowel movements.  It is cheapest from [employer], [employer], [employer].   Miralax  is an osmotic laxative.  It only brings more water into the stool.  This is safe to take daily.  Can take up to 17 gram of miralax  twice a day.  Mix with juice or coffee.  Start 1 capful at night for 3-4 days and reassess your response in 3-4 days.  You can increase and decrease the dose based on your response.  Remember, it can take up to 3-4 days to take effect OR for the effects to wear off.   I often pair this with benefiber in the morning to help assure the stool is not too  loose.    Here some information about pelvic floor dysfunction. This may be contributing to some of your symptoms. We will continue with our evaluation but I do want you to consider adding on fiber supplement with low-dose MiraLAX  daily. We could also refer to pelvic floor physical therapy.   Pelvic Floor Dysfunction, Female Pelvic floor dysfunction (PFD) is a condition that results when the group of muscles and connective tissues that support the organs in the pelvis (pelvic floor muscles) do not work well. These muscles and their connections form a sling that supports the colon and bladder. In women, they also support the uterus. PFD causes pelvic floor muscles to be too weak, too tight, or both. In PFD, muscle movements are not coordinated. This may cause bowel or bladder problems. It may also cause pain. What are the causes? This condition may be caused by an injury to the pelvic area or by a weakening of pelvic muscles. This often results from pregnancy and childbirth or other types of strain. In many cases, the exact cause is not known. What increases the risk? The following factors may make you more likely to develop this condition: Having chronic bladder tissue inflammation (interstitial cystitis). Being an older person. Being overweight. History of radiation treatment for cancer in the pelvic region. Previous pelvic surgery, such as removal of the uterus (hysterectomy). What are the signs or symptoms? Symptoms of this condition vary and may include: Bladder symptoms, such as: Trouble starting urination and emptying  the bladder. Frequent urinary tract infections. Leaking urine when coughing, laughing, or exercising (stress incontinence). Having to pass urine urgently or frequently. Pain when passing urine. Bowel symptoms, such as: Constipation. Urgent or frequent bowel movements. Incomplete bowel movements. Painful bowel movements. Leaking stool or gas. Unexplained genital  or rectal pain. Genital or rectal muscle spasms. Low back pain. Other symptoms may include: A heavy, full, or aching feeling in the vagina. A bulge that protrudes into the vagina. Pain during or after sex. How is this diagnosed? This condition may be diagnosed based on: Your symptoms and medical history. A physical exam. During the exam, your health care provider may check your pelvic muscles for tightness, spasm, pain, or weakness. This may include a rectal exam and a pelvic exam. In some cases, you may have diagnostic tests, such as: Electrical muscle function tests. Urine flow testing. X-ray tests of bowel function. Ultrasound of the pelvic organs. How is this treated? Treatment for this condition depends on the symptoms. Treatment options include: Physical therapy. This may include Kegel exercises to help relax or strengthen the pelvic floor muscles. Biofeedback. This type of therapy provides feedback on how tight your pelvic floor muscles are so that you can learn to control them. Internal or external massage therapy. A treatment that involves electrical stimulation of the pelvic floor muscles to help control pain (transcutaneous electrical nerve stimulation, or TENS). Sound wave therapy (ultrasound) to reduce muscle spasms. Medicines, such as: Muscle relaxants. Bladder control medicines. Surgery to reconstruct or support pelvic floor muscles may be an option if other treatments do not help. Follow these instructions at home: Activity Do your usual activities as told by your health care provider. Ask your health care provider if you should modify any activities. Do pelvic floor strengthening or relaxing exercises at home as told by your physical therapist. Lifestyle Maintain a healthy weight. Eat foods that are high in fiber, such as beans, whole grains, and fresh fruits and vegetables. Limit foods that are high in fat and processed sugars, such as fried or sweet foods. Manage  stress with relaxation techniques such as yoga or meditation. General instructions If you have problems with leakage: Use absorbable pads or wear padded underwear. Wash frequently with mild soap. Keep your genital and anal area as clean and dry as possible. Ask your health care provider if you should try a barrier cream to prevent skin irritation. Take warm baths to relieve pelvic muscle tension or spasms. Take over-the-counter and prescription medicines only as told by your health care provider. Keep all follow-up visits. How is this prevented? The cause of PFD is not always known, but there are a few things you can do to reduce the risk of developing this condition, including: Staying at a healthy weight. Getting regular exercise. Managing stress. Contact a health care provider if: Your symptoms are not improving with home care. You have signs or symptoms of PFD that get worse at home. You develop new signs or symptoms. You have signs of a urinary tract infection, such as: Fever. Chills. Increased urinary frequency. A burning feeling when urinating. You have not had a bowel movement in 3 days (constipation). Summary Pelvic floor dysfunction results when the muscles and connective tissues in your pelvic floor do not work well. These muscles and their connections form a sling that supports your colon and bladder. In women, they also support the uterus. PFD may be caused by an injury to the pelvic area or by a weakening of pelvic  muscles. PFD causes pelvic floor muscles to be too weak, too tight, or a combination of both. Symptoms may vary from person to person. In most cases, PFD can be treated with physical therapies and medicines. Surgery may be an option if other treatments do not help. This information is not intended to replace advice given to you by your health care provider. Make sure you discuss any questions you have with your health care provider. Document Revised:  04/28/2020 Document Reviewed: 04/28/2020 Elsevier Patient Education  2022 [employer].

## 2023-10-04 NOTE — Progress Notes (Signed)
 ____________________________________________________________  Attending physician addendum:  Thank you for sending this case to me. I have reviewed the entire note and agree with the plan.  She is on my schedule for a colonoscopy next month. 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  Victory Brand, MD  ____________________________________________________________

## 2023-10-05 ENCOUNTER — Ambulatory Visit: Payer: Self-pay | Admitting: Physician Assistant

## 2023-10-18 ENCOUNTER — Telehealth: Payer: Self-pay | Admitting: Cardiology

## 2023-10-18 NOTE — Telephone Encounter (Signed)
 Pt went to pcp and was advised to f/u with cardiologist for palpitations. Scheduled with [facility] 01/17/24, but would like a call from the nurse as well. Please advise.

## 2023-10-18 NOTE — Telephone Encounter (Signed)
 Reports sob with activity and palpitations. Appt made with APP for 10/22/23. She is aware of location. She denies any dizziness or chest pain. Given ER precautions. She verbalized understanding.

## 2023-10-19 NOTE — Progress Notes (Signed)
 Cardiology Office Note   Date:  10/22/2023  ID:  Miranda Kelley, DOB Oct 09, 1955, MRN [id number] PCP: Ilah Crigler, MD  [facility] Providers Cardiologist:  Dub Huntsman, DO     History of Present Illness Miranda Kelley is a 68 y.o. female with a past medical history of CAD, hypertension, PVCs, GERD, HIV, obesity, hypothyroidism.  04/21/2021 calcium  score of 597, 97th percentile 2015 ETT negative for ischemia 2009 echo EF 55 to 60%  She was initially evaluated by Dr. Claudene in 2015 for the evaluation of dyspnea on exertion and early family history of coronary artery disease, ETT was arranged revealing no ischemic changes.  In 2023 she underwent a calcium  score which was elevated at 597, 97th percentile.  Most recently she was evaluated by Damien Braver NP on 03/27/2023, no real changes since her last appointment with our office, some mild shortness of breath with exertion but overall improved, no changes made to medications or plan of care and she was advised she can follow-up in 1 year.  She presents today for follow-up, recently evaluated by GI doctor and was advised that she either had a murmur or a  skipped heartbeat, she is completely asymptomatic and unaware of palpitations.  She is not able to be very physically active right now due to ongoing hip pain, she is receiving injections for this.  She has recently lost approximately 20 pounds.  She is feeling good overall.  She endorses some shortness of breath after climbing her stairs at home but this is ongoing and intermittently. She denies chest pain, palpitations, dyspnea, pnd, orthopnea, n, v, dizziness, syncope, edema, weight gain, or early satiety.   ROS: Review of Systems  Musculoskeletal:  Positive for joint pain.  All other systems reviewed and are negative.    Studies Reviewed      Cardiac Studies & Procedures   ______________________________________________________________________________________________          CT  SCANS  CT CARDIAC SCORING (SELF PAY ONLY) 04/20/2021  Addendum 04/21/2021  9:54 AM ADDENDUM REPORT: 04/21/2021 09:52  CLINICAL DATA:  Cardiovascular Disease Risk stratification  EXAM: Coronary Calcium  Score  TECHNIQUE: A gated, non-contrast computed tomography scan of the heart was performed using 3mm slice thickness. Axial images were analyzed on a dedicated workstation. Calcium  scoring of the coronary arteries was performed using the Agatston method.  FINDINGS: Coronary arteries: Normal origins.  Coronary Calcium  Score:  Left main:  Left anterior descending artery: 355  Left circumflex artery: 43  Right coronary artery: 199  Total: 597  Percentile: 97th  Pericardium: Normal.  Ascending Aorta: Normal caliber.  Non-cardiac: See separate report from [facility].  IMPRESSION: Coronary calcium  score of 597. This was 97th percentile for age-, race-, and sex-matched controls.  RECOMMENDATIONS: Coronary artery calcium  (CAC) score is a strong predictor of incident coronary heart disease (CHD) and provides predictive information beyond traditional risk factors. CAC scoring is reasonable to use in the decision to withhold, postpone, or initiate statin therapy in intermediate-risk or selected borderline-risk asymptomatic adults (age 70-75 years and LDL-C >=70 to <190 mg/dL) who do not have diabetes or established atherosclerotic cardiovascular disease (ASCVD).* In intermediate-risk (10-year ASCVD risk >=7.5% to <20%) adults or selected borderline-risk (10-year ASCVD risk >=5% to <7.5%) adults in whom a CAC score is measured for the purpose of making a treatment decision the following recommendations have been made:  If CAC=0, it is reasonable to withhold statin therapy and reassess in 5 to 10 years, as long as higher risk conditions  are absent (diabetes mellitus, family history of premature CHD in first degree relatives (males <55 years; females <65 years),  cigarette smoking, or LDL >=190 mg/dL).  If CAC is 1 to 99, it is reasonable to initiate statin therapy for patients >=78 years of age.  If CAC is >=100 or >=75th percentile, it is reasonable to initiate statin therapy at any age.  Cardiology referral should be considered for patients with CAC scores >=400 or >=75th percentile.  *2018 AHA/ACC/AACVPR/AAPA/ABC/ACPM/ADA/AGS/APhA/ASPC/NLA/PCNA Guideline on the Management of Blood Cholesterol: A Report of the American College of Cardiology/American Heart Association Task Force on Clinical Practice Guidelines. J Am Coll Cardiol. 2019;73(24):3168-3209.  Lonni Nanas, MD   Electronically Signed By: Lonni Nanas M.D. On: 04/21/2021 09:52  Narrative EXAM: OVER-READ INTERPRETATION  CT CHEST  The following report is an over-read performed by radiologist Dr. Rockey Kilts of [facility] on 04/20/2021. This over-read does not include interpretation of cardiac or coronary anatomy or pathology. The calcium  score interpretation by the cardiologist is attached.  COMPARISON:  01/07/2019 chest radiograph  FINDINGS: Vascular: Normal aortic caliber.  Tortuous thoracic aorta.  Mediastinum/Nodes: No imaged thoracic adenopathy.  Lungs/Pleura: No pleural fluid.  Left upper lobe/lingular scarring.  Upper Abdomen: Surgical changes about the proximal stomach. Normal imaged portions of the liver, spleen.  Musculoskeletal: No acute osseous abnormality.  IMPRESSION: No acute findings in the imaged extracardiac chest.  Electronically Signed: By: Rockey Kilts M.D. On: 04/20/2021 11:24     ______________________________________________________________________________________________      Risk Assessment/Calculations           Physical Exam VS:  BP 112/68 (BP Location: Left Arm, Patient Position: Sitting)   Pulse 63   Ht 5' 3 (1.6 m)   Wt 244 lb (110.7 kg)   LMP 04/03/2011   SpO2 99%   BMI 43.22 kg/m         Wt Readings from Last 3 Encounters:  10/22/23 244 lb (110.7 kg)  10/04/23 248 lb 4 oz (112.6 kg)  03/27/23 262 lb 3.2 oz (118.9 kg)    GEN: Well nourished, well developed in no acute distress NECK: No JVD; No carotid bruits CARDIAC: RRR, 1/6 systolic murmur very difficult to hear, rubs, gallops RESPIRATORY:  Clear to auscultation without rales, wheezing or rhonchi  ABDOMEN: Soft, non-tender, non-distended EXTREMITIES:  No edema; No deformity   ASSESSMENT AND PLAN CAD-CAC score of 597 in 2023 placing her in the 97th percentile, continue aspirin  81 mg daily, Crestor  40 mg daily. Stable with no anginal symptoms. No indication for ischemic evaluation.    Dyslipidemia-most recent LDL was well-controlled at 51 on 01/24/2023, Crestor  40 mg daily.  Will repeat c-Met, direct LDL, LP(a) today.  Palpitations-she has not necessarily noticed these however was told by a another provider that her heart was skipping beats.  Will arrange for a 3-day monitor.  Murmur-will arrange for an echocardiogram.       Dispo: Echocardiogram, 3-day monitor, c-Met, direct LDL, LP(a), follow-up in 1 year with Dr. Sheena.   Signed, Delon JAYSON Hoover, NP

## 2023-10-22 ENCOUNTER — Encounter: Payer: Self-pay | Admitting: Cardiology

## 2023-10-22 ENCOUNTER — Ambulatory Visit: Attending: Cardiology

## 2023-10-22 ENCOUNTER — Ambulatory Visit: Attending: Cardiology | Admitting: Cardiology

## 2023-10-22 VITALS — BP 112/68 | HR 63 | Ht 63.0 in | Wt 244.0 lb

## 2023-10-22 DIAGNOSIS — R002 Palpitations: Secondary | ICD-10-CM

## 2023-10-22 DIAGNOSIS — R931 Abnormal findings on diagnostic imaging of heart and coronary circulation: Secondary | ICD-10-CM

## 2023-10-22 DIAGNOSIS — E785 Hyperlipidemia, unspecified: Secondary | ICD-10-CM

## 2023-10-22 DIAGNOSIS — R011 Cardiac murmur, unspecified: Secondary | ICD-10-CM | POA: Diagnosis not present

## 2023-10-22 NOTE — Patient Instructions (Addendum)
 Medication Instructions:   Your physician recommends that you continue on your current medications as directed. Please refer to the Current Medication list given to you today.   *If you need a refill on your cardiac medications before your next appointment, please call your pharmacy*   Lab Work:   PLEASE GO DOWN STAIRS  [facility]  FIRST FLOOR   ( GET OFF ELEVATORS WALK TOWARDS WAITING AREA LAB LOCATED BY PHARMACY):  DIRECT LDL, LPA AND CMET TODAY      If you have labs (blood work) drawn today and your tests are completely normal, you will receive your results only by: MyChart Message (if you have MyChart) OR A paper copy in the mail If you have any lab test that is abnormal or we need to change your treatment, we will call you to review the results.   Test and procedures:  Your physician has requested that you have an echocardiogram. Echocardiography is a painless test that uses sound waves to create images of your heart. It provides your doctor with information about the size and shape of your heart and how well your heart's chambers and valves are working. This procedure takes approximately one hour. There are no restrictions for this procedure. Please do NOT wear cologne, perfume, aftershave, or lotions (deodorant is allowed). Please arrive 15 minutes prior to your appointment time.  Please note: We ask at that you not bring children with you during ultrasound (echo/ vascular) testing. Due to room size and safety concerns, children are not allowed in the ultrasound rooms during exams. Our front office staff cannot provide observation of children in our lobby area while testing is being conducted. An adult accompanying a patient to their appointment will only be allowed in the ultrasound room at the discretion of the ultrasound technician under special circumstances. We apologize for any inconvenience.  Your physician has recommended that you wear an event monitor. Event monitors are  medical devices that record the heart's electrical activity. Doctors most often us  these monitors to diagnose arrhythmias. Arrhythmias are problems with the speed or rhythm of the heartbeat. The monitor is a small, portable device. You can wear one while you do your normal daily activities. This is usually used to diagnose what is causing palpitations/syncope (passing out).   Follow-Up: At [facility], you and your health needs are our priority.  As part of our continuing mission to provide you with exceptional heart care, our providers are all part of one team.  This team includes your primary Cardiologist (physician) and Advanced Practice Providers or APPs (Physician Assistants and Nurse Practitioners) who all work together to provide you with the care you need, when you need it.  Your next appointment:   1 year(s)  Provider:   Kardie Tobb, DO     We recommend signing up for the patient portal called MyChart.  Sign up information is provided on this After Visit Summary.  MyChart is used to connect with patients for Virtual Visits (Telemedicine).  Patients are able to view lab/test results, encounter notes, upcoming appointments, etc.  Non-urgent messages can be sent to your provider as well.   To learn more about what you can do with MyChart, go to [email or web address].   Other Instructions   ZIO XT- Long Term Monitor Instructions  Your physician has requested you wear a ZIO patch monitor for  3 days.  This is a single patch monitor. [employer] supplies one patch monitor per enrollment. Additional stickers are not available.  Please do not apply patch if you will be having a Nuclear Stress Test,  Echocardiogram, Cardiac CT, MRI, or Chest Xray during the period you would be wearing the  monitor. The patch cannot be worn during these tests. You cannot remove and re-apply the  ZIO XT patch monitor.  Your ZIO patch monitor will be mailed 3 day USPS to your address on file. It may  take 3-5 days  to receive your monitor after you have been enrolled.  Once you have received your monitor, please review the enclosed instructions. Your monitor  has already been registered assigning a specific monitor serial # to you.  Billing and Patient Assistance Program Information  We have supplied [employer] with any of your insurance information on file for billing purposes. [employer] offers a sliding scale Patient Assistance Program for patients that do not have  insurance, or whose insurance does not completely cover the cost of the ZIO monitor.  You must apply for the Patient Assistance Program to qualify for this discounted rate.  To apply, please call [employer] at [phone], select option 4, select option 2, ask to apply for  Patient Assistance Program. Meredeth will ask your household income, and how many people  are in your household. They will quote your out-of-pocket cost based on that information.  [employer] will also be able to set up a 1-month, interest-free payment plan if needed.  Applying the monitor   Shave hair from upper left chest.  Hold abrader disc by orange tab. Rub abrader in 40 strokes over the upper left chest as  indicated in your monitor instructions.  Clean area with 4 enclosed alcohol pads. Let dry.  Apply patch as indicated in monitor instructions. Patch will be placed under collarbone on left  side of chest with arrow pointing upward.  Rub patch adhesive wings for 2 minutes. Remove white label marked 1. Remove the white  label marked 2. Rub patch adhesive wings for 2 additional minutes.  While looking in a mirror, press and release button in center of patch. A small green light will  flash 3-4 times. This will be your only indicator that the monitor has been turned on.  Do not shower for the first 24 hours. You may shower after the first 24 hours.  Press the button if you feel a symptom. You will hear a small click. Record Date, Time and  Symptom in  the Patient Logbook.  When you are ready to remove the patch, follow instructions on the last 2 pages of Patient  Logbook. Stick patch monitor onto the last page of Patient Logbook.  Place Patient Logbook in the blue and white box. Use locking tab on box and tape box closed  securely. The blue and white box has prepaid postage on it. Please place it in the mailbox as  soon as possible. Your physician should have your test results approximately 7 days after the  monitor has been mailed back to [facility].  Call [facility] Customer Care at [phone] if you have questions regarding  your ZIO XT patch monitor. Call them immediately if you see an orange light blinking on your  monitor.  If your monitor falls off in less than 4 days, contact our Monitor department at [phone].  If your monitor becomes loose or falls off after 4 days call [employer] at [phone] for  suggestions on securing your monitor

## 2023-10-22 NOTE — Progress Notes (Unsigned)
 Enrolled patient for a 3 day Zio XT monitor to be mailed to patients home   Tobb to read

## 2023-10-23 ENCOUNTER — Ambulatory Visit: Payer: Self-pay | Admitting: Cardiology

## 2023-10-23 DIAGNOSIS — I10 Essential (primary) hypertension: Secondary | ICD-10-CM

## 2023-10-23 DIAGNOSIS — E785 Hyperlipidemia, unspecified: Secondary | ICD-10-CM

## 2023-10-23 DIAGNOSIS — Z79899 Other long term (current) drug therapy: Secondary | ICD-10-CM

## 2023-10-23 LAB — COMPREHENSIVE METABOLIC PANEL WITH GFR
ALT: 16 IU/L (ref 0–32)
AST: 23 IU/L (ref 0–40)
Albumin: 4.1 g/dL (ref 3.9–4.9)
Alkaline Phosphatase: 88 IU/L (ref 49–135)
BUN/Creatinine Ratio: 13 (ref 12–28)
BUN: 13 mg/dL (ref 8–27)
Bilirubin Total: 0.2 mg/dL (ref 0.0–1.2)
CO2: 20 mmol/L (ref 20–29)
Calcium: 10.2 mg/dL (ref 8.7–10.3)
Chloride: 105 mmol/L (ref 96–106)
Creatinine, Ser: 0.99 mg/dL (ref 0.57–1.00)
Globulin, Total: 2.8 g/dL (ref 1.5–4.5)
Glucose: 97 mg/dL (ref 70–99)
Potassium: 4.1 mmol/L (ref 3.5–5.2)
Sodium: 139 mmol/L (ref 134–144)
Total Protein: 6.9 g/dL (ref 6.0–8.5)
eGFR: 62 mL/min/1.73

## 2023-10-23 LAB — LDL CHOLESTEROL, DIRECT: LDL Direct: 43 mg/dL (ref 0–99)

## 2023-10-23 LAB — LIPOPROTEIN A (LPA): Lipoprotein (a): 215.1 nmol/L — ABNORMAL HIGH

## 2023-10-23 MED ORDER — EZETIMIBE 10 MG PO TABS: 10.0000 mg | ORAL_TABLET | Freq: Every day | ORAL | 3 refills | Status: AC

## 2023-10-25 ENCOUNTER — Other Ambulatory Visit (HOSPITAL_BASED_OUTPATIENT_CLINIC_OR_DEPARTMENT_OTHER): Payer: Self-pay

## 2023-10-25 MED ORDER — ALPRAZOLAM 1 MG PO TABS
1.0000 mg | ORAL_TABLET | Freq: Three times a day (TID) | ORAL | 0 refills | Status: DC
  Filled 2023-10-25: qty 90, 30d supply, fill #0

## 2023-10-25 MED ORDER — OXYCODONE-ACETAMINOPHEN 10-325 MG PO TABS
1.0000 | ORAL_TABLET | ORAL | 0 refills | Status: AC | PRN
  Filled 2023-10-25: qty 120, 20d supply, fill #0

## 2023-11-04 ENCOUNTER — Other Ambulatory Visit: Payer: Self-pay | Admitting: Physician Assistant

## 2023-11-09 DIAGNOSIS — R002 Palpitations: Secondary | ICD-10-CM | POA: Diagnosis not present

## 2023-11-12 MED ORDER — METOPROLOL TARTRATE 25 MG PO TABS: 12.5000 mg | ORAL_TABLET | Freq: Two times a day (BID) | ORAL | 3 refills | Status: AC

## 2023-11-12 MED ORDER — METOPROLOL TARTRATE 25 MG PO TABS: 25.0000 mg | ORAL_TABLET | Freq: Two times a day (BID) | ORAL | 3 refills | Status: DC

## 2023-11-15 ENCOUNTER — Telehealth: Payer: Self-pay | Admitting: *Deleted

## 2023-11-15 ENCOUNTER — Encounter: Payer: Self-pay | Admitting: Gastroenterology

## 2023-11-15 NOTE — Telephone Encounter (Signed)
 Called and spoke with patient regarding upcoming appt and cardiac work up. I offered to reschedule colonoscopy for a late December date. Patient requested that the appt be cancelled and she will call back to reschedule.

## 2023-11-15 NOTE — Telephone Encounter (Signed)
 Dr. Legrand ,  This pt is scheduled with you on 11/22/23.  She is undergoing a cardiology w/u for elevated cardiac calcium  score and mild cardiac symptoms.  In order to clear her we will need to delay her procedure until completion of the w/u or obtain a cardiac clearance.  Best regards,  Norleen EMERSON Schillings

## 2023-11-15 NOTE — Telephone Encounter (Signed)
 This patient was seen by Alan in clinic on 10/04/2023 for constipation and heme positive stool. Currently scheduled for colonoscopy in the [facility] on 11/22/2023  She was then seen by cardiology on 10/22/2023 where she reported some dyspnea and reportedly had an irregular rhythm on exam at our office (without palpitations).  High calcium  score on prior cardiac CT  Chart was reviewed by anesthesia today, and this procedure needs to be rescheduled until after the cardiac workup.  Echocardiogram is scheduled for 12/11/2023, cardiac monitor reportedly ordered, timing not noted and there office note.  Please contact this patient and move her colonoscopy to late December, which should give enough time for both of those test to be resulted.  H Danis

## 2023-11-16 ENCOUNTER — Other Ambulatory Visit: Payer: Self-pay

## 2023-11-16 DIAGNOSIS — B20 Human immunodeficiency virus [HIV] disease: Secondary | ICD-10-CM

## 2023-11-16 MED ORDER — BIKTARVY 50-200-25 MG PO TABS: 1.0000 | ORAL_TABLET | Freq: Every day | ORAL | 2 refills | Status: DC

## 2023-11-19 ENCOUNTER — Encounter: Payer: Self-pay | Admitting: *Deleted

## 2023-11-19 NOTE — Research (Unsigned)
 Message left for Miranda Kelley about pre-event research study. Encouraged her to call is she is interested in receiving more information about the Study.

## 2023-11-20 ENCOUNTER — Other Ambulatory Visit: Payer: Self-pay

## 2023-11-20 ENCOUNTER — Other Ambulatory Visit (HOSPITAL_BASED_OUTPATIENT_CLINIC_OR_DEPARTMENT_OTHER): Payer: Self-pay

## 2023-11-20 MED ORDER — ALPRAZOLAM 1 MG PO TABS
1.0000 mg | ORAL_TABLET | Freq: Three times a day (TID) | ORAL | 0 refills | Status: DC
  Filled 2023-11-20 – 2023-11-22 (×2): qty 90, 30d supply, fill #0

## 2023-11-20 MED ORDER — OXYCODONE-ACETAMINOPHEN 10-325 MG PO TABS
1.0000 | ORAL_TABLET | ORAL | 0 refills | Status: DC
  Filled 2023-11-20: qty 120, 20d supply, fill #0

## 2023-11-22 ENCOUNTER — Other Ambulatory Visit: Payer: Self-pay

## 2023-11-22 ENCOUNTER — Encounter: Admitting: Gastroenterology

## 2023-11-22 ENCOUNTER — Other Ambulatory Visit (HOSPITAL_BASED_OUTPATIENT_CLINIC_OR_DEPARTMENT_OTHER): Payer: Self-pay

## 2023-12-11 ENCOUNTER — Ambulatory Visit (HOSPITAL_COMMUNITY)
Admission: RE | Admit: 2023-12-11 | Discharge: 2023-12-11 | Disposition: A | Discharge status: Home / Self Care | Source: Ambulatory Visit | Attending: Cardiology | Admitting: Cardiology

## 2023-12-11 DIAGNOSIS — R011 Cardiac murmur, unspecified: Secondary | ICD-10-CM

## 2023-12-11 LAB — ECHOCARDIOGRAM COMPLETE
Area-P 1/2: 2.91 cm2
S' Lateral: 2.9 cm

## 2023-12-20 ENCOUNTER — Other Ambulatory Visit (HOSPITAL_BASED_OUTPATIENT_CLINIC_OR_DEPARTMENT_OTHER): Payer: Self-pay

## 2023-12-20 MED ORDER — ALPRAZOLAM 1 MG PO TABS
1.0000 mg | ORAL_TABLET | Freq: Three times a day (TID) | ORAL | 0 refills | Status: DC
  Filled 2023-12-20: qty 90, 30d supply, fill #0

## 2023-12-20 MED FILL — Oxycodone w/ Acetaminophen Tab 10-325 MG: 1.0000 | ORAL | 20 days supply | Qty: 120 | Fill #0 | Status: AC

## 2024-01-08 ENCOUNTER — Encounter: Payer: Self-pay | Admitting: Internal Medicine

## 2024-01-08 ENCOUNTER — Other Ambulatory Visit: Payer: Self-pay

## 2024-01-08 ENCOUNTER — Ambulatory Visit: Admitting: Internal Medicine

## 2024-01-08 DIAGNOSIS — B20 Human immunodeficiency virus [HIV] disease: Secondary | ICD-10-CM | POA: Diagnosis not present

## 2024-01-08 DIAGNOSIS — Z2821 Immunization not carried out because of patient refusal: Secondary | ICD-10-CM

## 2024-01-08 MED ORDER — BIKTARVY 50-200-25 MG PO TABS: 1.0000 | ORAL_TABLET | Freq: Every day | ORAL | 11 refills | Status: AC

## 2024-01-08 NOTE — Progress Notes (Signed)
 "      [facility]     HPI: Miranda Kelley is a 69 y.o. female presents for HIV management on biktarvy . Onlyone missed dose of biktarvy  Sexually active maybe last year Past Medical History:  Diagnosis Date   Anxiety    Arthritis    knees   Cataract    surgery - bilateral   Chronic sinus infection    Dyspnea    HIV infection (HCC)    Hypertension    hx - no longer a problem - weight loss surgery -lost 100lbs   Hypothyroidism    Pneumonia    Thyroid  disease     Past Surgical History:  Procedure Laterality Date   CARPAL TUNNEL RELEASE Left 03/05/2023   Procedure: LEFT CARPAL TUNNEL RELEASE;  Surgeon: Murrell Drivers, MD;  Location: [facility];  Service: Orthopedics;  Laterality: Left;  30 MIN   cataract surgery Bilateral    CHOLECYSTECTOMY  06/05/2011   Procedure: LAPAROSCOPIC CHOLECYSTECTOMY WITH INTRAOPERATIVE CHOLANGIOGRAM;  Surgeon: Morene ONEIDA Olives, MD;  Location: [facility];  Service: General;  Laterality: N/A;   JOINT REPLACEMENT  06/02/2009   right knee   LAPAROSCOPIC GASTRIC SLEEVE RESECTION  01/02/2013   TOTAL HIP ARTHROPLASTY Right    TOTAL KNEE ARTHROPLASTY Left 05/16/2022   Procedure: TOTAL KNEE ARTHROPLASTY;  Surgeon: Ernie Cough, MD;  Location: [facility];  Service: Orthopedics;  Laterality: Left;    Family History  Problem Relation Age of Onset   Heart failure Mother    Heart disease Mother    Breast cancer Mother    Diabetes Mother    Kidney failure Mother    Colon cancer Father    Heart attack Sister    Arthritis Sister    Heart attack Brother    Gout Brother    Breast cancer Maternal Grandmother    Diabetes Maternal Grandfather    Colon polyps Neg Hx    Esophageal cancer Neg Hx    Rectal cancer Neg Hx    Stomach cancer Neg Hx    Medications Ordered Prior to Encounter[1]  Allergies[2]    Lab Results HIV 1 RNA Quant (Copies/mL)  Date Value  01/24/2023 Not Detected  01/17/2022 Not Detected  03/22/2021 Not  Detected   CD4 T Cell Abs (/uL)  Date Value  01/24/2023 1,243  01/17/2022 1,405  03/22/2021 1,300   No results found for: HIV1GENOSEQ Lab Results  Component Value Date   WBC 6.1 10/04/2023   HGB 11.9 (L) 10/04/2023   HCT 37.5 10/04/2023   MCV 90.3 10/04/2023   PLT 268.0 10/04/2023    Lab Results  Component Value Date   CREATININE 0.99 10/22/2023   BUN 13 10/22/2023   NA 139 10/22/2023   K 4.1 10/22/2023   CL 105 10/22/2023   CO2 20 10/22/2023   Lab Results  Component Value Date   ALT 16 10/22/2023   AST 23 10/22/2023   ALKPHOS 88 10/22/2023   BILITOT 0.2 10/22/2023    Lab Results  Component Value Date   CHOL 146 01/24/2023   TRIG 81 01/24/2023   HDL 79 01/24/2023   LDLCALC 51 01/24/2023   No results found for: HAV Lab Results  Component Value Date   HEPBSAG NEG 03/18/2009   HEPBSAB NEG 03/18/2009   Lab Results  Component Value Date   HCVAB NEG 03/18/2009   Lab Results  Component Value Date   CHLAMYDIAWP Negative 01/17/2022   N Negative 01/17/2022   No results  found for: GCPROBEAPT No results found for: QUANTGOLD  Assessment/Plan #HIV -CD4 1225, VLnd, on 01/24/23 -Continue biktarvy  -HIV labs today -f/u in one year   #Vaccination-declined COVID Flu Monkeypox PCV Meningitis HepA HEpB Tdap Shingles  #Health maintenance -Quantiferon  today 01/08/23 -RPR NR 01/24/23, today 01/08/23 -HCV, today 01/08/23 -GC today 01/08/23 -Lipid onstatin today 01/08/23 -Mammogram  03/20/23 -Colonoscopy, pending    Loney Stank, MD [facility]  I personally spent a total of 45 minutes in the care of the patient today including preparing to see the patient, getting/reviewing separately obtained history, performing a medically appropriate exam/evaluation, counseling and educating, placing orders, documenting clinical information in the EHR, independently interpreting results, and communicating  results.     [1]  Current Outpatient Medications on File Prior to Visit  Medication Sig Dispense Refill   acetaminophen  (TYLENOL ) 500 MG tablet Take 2 tablets (1,000 mg total) by mouth every 6 (six) hours. 30 tablet 0   albuterol  (VENTOLIN  HFA) 108 (90 Base) MCG/ACT inhaler Inhale 2 puffs into the lungs every 6 (six) hours as needed for wheezing or shortness of breath.     alendronate (FOSAMAX) 70 MG tablet Take 70 mg by mouth once a week.     ALPRAZolam  (XANAX ) 1 MG tablet Take 1 tablet (1 mg total) by mouth 3 (three) times daily. 90 tablet 0   aspirin  81 MG chewable tablet Chew 81 mg by mouth daily.     bictegravir-emtricitabine-tenofovir AF (BIKTARVY ) 50-200-25 MG TABS tablet Take 1 tablet by mouth daily. 30 tablet 2   Cholecalciferol (VITAMIN D3) 2000 units TABS Take 2,000 Units by mouth daily.      clobetasol cream (TEMOVATE) 0.05 % Apply 1 Application topically as needed.     cyanocobalamin  (,VITAMIN B-12,) 1000 MCG/ML injection Inject 1,000 mcg into the muscle every 30 (thirty) days.     cyclobenzaprine  (FLEXERIL ) 10 MG tablet Take 10 mg by mouth 3 (three) times daily.     diclofenac  (VOLTAREN ) 75 MG EC tablet Take 75 mg by mouth 2 (two) times daily.     diclofenac  Sodium (VOLTAREN ) 1 % GEL Apply 2 g topically daily as needed (pain).     ezetimibe  (ZETIA ) 10 MG tablet Take 1 tablet (10 mg total) by mouth daily. 90 tablet 3   levothyroxine  (SYNTHROID , LEVOTHROID) 150 MCG tablet Take 150 mcg by mouth daily before breakfast.     linaCLOtide (LINZESS PO) Take by mouth as needed.     LINZESS 72 MCG capsule Take 72 mcg by mouth daily.     metoprolol  tartrate (LOPRESSOR ) 25 MG tablet Take 0.5 tablets (12.5 mg total) by mouth 2 (two) times daily. 180 tablet 3   naloxone  (NARCAN ) nasal spray 4 mg/0.1 mL Spray into nostril with signs of opioid related oversedation or overdose 1 each 0   oxyCODONE -acetaminophen  (PERCOCET) 10-325 MG tablet Take 1 tablet by mouth every 4 to 6 hours as needed. 120  tablet 0   oxyCODONE -acetaminophen  (PERCOCET) 10-325 MG tablet Take 1 tablet by mouth every 4-6 hours as needed. 120 tablet 0   rosuvastatin  (CRESTOR ) 40 MG tablet Take 1 tablet (40 mg total) by mouth daily. 90 tablet 3   tiZANidine  (ZANAFLEX ) 4 MG tablet Take 1 tablet (4 mg total) by mouth every 8 (eight) hours as needed for muscle spasms. Do not take cyclobenzaprine  while on this medication 30 tablet 0   triamcinolone cream (KENALOG) 0.1 % Apply 1 Application topically daily as needed (irritation).  GAVILAX 17 GM/SCOOP powder TAKE 238 G BY MOUTH AS DIRECTED. FOR COLONOSCOPY (Patient not taking: Reported on 01/08/2024) 238 g 0   No current facility-administered medications on file prior to visit.  [2] No Known Allergies  "

## 2024-01-09 LAB — T-HELPER CELLS (CD4) COUNT (NOT AT ARMC)
CD4 % Helper T Cell: 43 % (ref 33–65)
CD4 T Cell Abs: 1240 /uL (ref 400–1790)

## 2024-01-10 ENCOUNTER — Encounter: Payer: Self-pay | Admitting: Cardiology

## 2024-01-10 LAB — CBC WITH DIFFERENTIAL/PLATELET
Absolute Lymphocytes: 3160 {cells}/uL (ref 850–3900)
Absolute Monocytes: 458 {cells}/uL (ref 200–950)
Basophils Absolute: 31 {cells}/uL (ref 0–200)
Basophils Relative: 0.5 %
Eosinophils Absolute: 159 {cells}/uL (ref 15–500)
Eosinophils Relative: 2.6 %
HCT: 37.4 % (ref 35.9–46.0)
Hemoglobin: 11.6 g/dL — ABNORMAL LOW (ref 11.7–15.5)
MCH: 27.7 pg (ref 27.0–33.0)
MCHC: 31 g/dL — ABNORMAL LOW (ref 31.6–35.4)
MCV: 89.3 fL (ref 81.4–101.7)
MPV: 10.7 fL (ref 7.5–12.5)
Monocytes Relative: 7.5 %
Neutro Abs: 2294 {cells}/uL (ref 1500–7800)
Neutrophils Relative %: 37.6 %
Platelets: 279 Thousand/uL (ref 140–400)
RBC: 4.19 Million/uL (ref 3.80–5.10)
RDW: 12.4 % (ref 11.0–15.0)
Total Lymphocyte: 51.8 %
WBC: 6.1 Thousand/uL (ref 3.8–10.8)

## 2024-01-10 LAB — COMPLETE METABOLIC PANEL WITHOUT GFR
AG Ratio: 1.3 (calc) (ref 1.0–2.5)
ALT: 16 U/L (ref 6–29)
AST: 16 U/L (ref 10–35)
Albumin: 3.9 g/dL (ref 3.6–5.1)
Alkaline phosphatase (APISO): 80 U/L (ref 37–153)
BUN: 17 mg/dL (ref 7–25)
CO2: 25 mmol/L (ref 20–32)
Calcium: 9.8 mg/dL (ref 8.6–10.4)
Chloride: 108 mmol/L (ref 98–110)
Creat: 0.97 mg/dL (ref 0.50–1.05)
Globulin: 2.9 g/dL (ref 1.9–3.7)
Glucose, Bld: 89 mg/dL (ref 65–99)
Potassium: 4 mmol/L (ref 3.5–5.3)
Sodium: 141 mmol/L (ref 135–146)
Total Bilirubin: 0.4 mg/dL (ref 0.2–1.2)
Total Protein: 6.8 g/dL (ref 6.1–8.1)

## 2024-01-10 LAB — LIPID PANEL
Cholesterol: 122 mg/dL
HDL: 76 mg/dL
LDL Cholesterol (Calc): 30 mg/dL
Non-HDL Cholesterol (Calc): 46 mg/dL
Total CHOL/HDL Ratio: 1.6 (calc)
Triglycerides: 78 mg/dL

## 2024-01-10 LAB — QUANTIFERON-TB GOLD PLUS
Mitogen-NIL: >10 [IU]/mL
NIL: 0.03 [IU]/mL
QuantiFERON-TB Gold Plus: NEGATIVE
TB1-NIL: 0 [IU]/mL
TB2-NIL: 0.01 [IU]/mL

## 2024-01-10 LAB — SYPHILIS: RPR W/REFLEX TO RPR TITER AND TREPONEMAL ANTIBODIES, TRADITIONAL SCREENING AND DIAGNOSIS ALGORITHM: RPR Ser Ql: NONREACTIVE

## 2024-01-10 LAB — HIV-1 RNA QUANT-NO REFLEX-BLD
HIV 1 RNA Quant: NOT DETECTED {copies}/mL
HIV-1 RNA Quant, Log: NOT DETECTED {Log_copies}/mL

## 2024-01-10 LAB — HEPATITIS C ANTIBODY: Hepatitis C Ab: NONREACTIVE

## 2024-01-14 ENCOUNTER — Ambulatory Visit: Payer: Self-pay | Admitting: Internal Medicine

## 2024-01-15 ENCOUNTER — Encounter: Payer: Self-pay | Admitting: Diagnostic Neuroimaging

## 2024-01-15 ENCOUNTER — Ambulatory Visit: Admitting: Diagnostic Neuroimaging

## 2024-01-15 VITALS — BP 125/77 | HR 63 | Ht 63.0 in | Wt 253.6 lb

## 2024-01-15 DIAGNOSIS — M25551 Pain in right hip: Secondary | ICD-10-CM | POA: Diagnosis not present

## 2024-01-15 DIAGNOSIS — G8929 Other chronic pain: Secondary | ICD-10-CM

## 2024-01-15 DIAGNOSIS — M533 Sacrococcygeal disorders, not elsewhere classified: Secondary | ICD-10-CM

## 2024-01-15 NOTE — Progress Notes (Signed)
 "  [facility]  PATIENT: Miranda Kelley DOB: July 10, 1955  REFERRING CLINICIAN: Ilah Crigler, MD HISTORY FROM: patient  REASON FOR VISIT: new consult   HISTORICAL  CHIEF COMPLAINT:  Chief Complaint  Patient presents with   RM 7     Patient is here for Patient has issues with nerves in her hip (lumbar 2-3 is messed up) and has been having issues with a nerve in her arm only on the right side. No longer has a shooting pain in her arm.     HISTORY OF PRESENT ILLNESS:   69 year old female here for evaluation of right lower back and right hip pain.  Symptoms started around December 2024.  She has had bilateral knee and right hip surgeries in the past.  Left knee surgery in March 2025 and right hip surgery in 2017.  Symptoms worse when she stands and walks.  She tends to limp on her right side.  She has been using some over-the-counter pain medication to help with symptoms.   REVIEW OF SYSTEMS: Full 14 system review of systems performed and negative with exception of: as per HPI.  ALLERGIES: Allergies[1]  HOME MEDICATIONS: Outpatient Medications Prior to Visit  Medication Sig Dispense Refill   acetaminophen  (TYLENOL ) 500 MG tablet Take 2 tablets (1,000 mg total) by mouth every 6 (six) hours. 30 tablet 0   albuterol  (VENTOLIN  HFA) 108 (90 Base) MCG/ACT inhaler Inhale 2 puffs into the lungs every 6 (six) hours as needed for wheezing or shortness of breath.     alendronate (FOSAMAX) 70 MG tablet Take 70 mg by mouth once a week.     ALPRAZolam  (XANAX ) 1 MG tablet Take 1 tablet (1 mg total) by mouth 3 (three) times daily. 90 tablet 0   aspirin  81 MG chewable tablet Chew 81 mg by mouth daily.     bictegravir-emtricitabine-tenofovir AF (BIKTARVY ) 50-200-25 MG TABS tablet Take 1 tablet by mouth daily. 30 tablet 11   Cholecalciferol (VITAMIN D3) 2000 units TABS Take 2,000 Units by mouth daily.      clobetasol cream (TEMOVATE) 0.05 % Apply 1 Application topically as needed.      cyanocobalamin  (,VITAMIN B-12,) 1000 MCG/ML injection Inject 1,000 mcg into the muscle every 30 (thirty) days.     cyclobenzaprine  (FLEXERIL ) 10 MG tablet Take 10 mg by mouth 3 (three) times daily.     diclofenac  (VOLTAREN ) 75 MG EC tablet Take 75 mg by mouth 2 (two) times daily.     diclofenac  Sodium (VOLTAREN ) 1 % GEL Apply 2 g topically daily as needed (pain).     ezetimibe  (ZETIA ) 10 MG tablet Take 1 tablet (10 mg total) by mouth daily. 90 tablet 3   GAVILAX 17 GM/SCOOP powder TAKE 238 G BY MOUTH AS DIRECTED. FOR COLONOSCOPY 238 g 0   levothyroxine  (SYNTHROID , LEVOTHROID) 150 MCG tablet Take 150 mcg by mouth daily before breakfast.     linaCLOtide (LINZESS PO) Take by mouth as needed.     LINZESS 72 MCG capsule Take 72 mcg by mouth daily.     metoprolol  tartrate (LOPRESSOR ) 25 MG tablet Take 0.5 tablets (12.5 mg total) by mouth 2 (two) times daily. 180 tablet 3   naloxone  (NARCAN ) nasal spray 4 mg/0.1 mL Spray into nostril with signs of opioid related oversedation or overdose 1 each 0   oxyCODONE -acetaminophen  (PERCOCET) 10-325 MG tablet Take 1 tablet by mouth every 4 to 6 hours as needed. 120 tablet 0   oxyCODONE -acetaminophen  (PERCOCET) 10-325 MG tablet Take 1  tablet by mouth every 4-6 hours as needed. 120 tablet 0   rosuvastatin  (CRESTOR ) 40 MG tablet Take 1 tablet (40 mg total) by mouth daily. 90 tablet 3   tiZANidine  (ZANAFLEX ) 4 MG tablet Take 1 tablet (4 mg total) by mouth every 8 (eight) hours as needed for muscle spasms. Do not take cyclobenzaprine  while on this medication 30 tablet 0   triamcinolone cream (KENALOG) 0.1 % Apply 1 Application topically daily as needed (irritation).     No facility-administered medications prior to visit.    PAST MEDICAL HISTORY: Past Medical History:  Diagnosis Date   Anxiety    Arthritis    knees   Cataract    surgery - bilateral   Chronic sinus infection    Dyspnea    HIV infection (HCC)    Hypertension    hx - no longer a problem -  weight loss surgery -lost 100lbs   Hypothyroidism    Pneumonia    Thyroid  disease     PAST SURGICAL HISTORY: Past Surgical History:  Procedure Laterality Date   CARPAL TUNNEL RELEASE Left 03/05/2023   Procedure: LEFT CARPAL TUNNEL RELEASE;  Surgeon: Murrell Drivers, MD;  Location: [facility];  Service: Orthopedics;  Laterality: Left;  30 MIN   cataract surgery Bilateral    CHOLECYSTECTOMY  06/05/2011   Procedure: LAPAROSCOPIC CHOLECYSTECTOMY WITH INTRAOPERATIVE CHOLANGIOGRAM;  Surgeon: Morene ONEIDA Olives, MD;  Location: [facility];  Service: General;  Laterality: N/A;   JOINT REPLACEMENT  06/02/2009   right knee   LAPAROSCOPIC GASTRIC SLEEVE RESECTION  01/02/2013   TOTAL HIP ARTHROPLASTY Right    TOTAL KNEE ARTHROPLASTY Left 05/16/2022   Procedure: TOTAL KNEE ARTHROPLASTY;  Surgeon: Ernie Cough, MD;  Location: [facility];  Service: Orthopedics;  Laterality: Left;    FAMILY HISTORY: Family History  Problem Relation Age of Onset   Heart failure Mother    Heart disease Mother    Breast cancer Mother    Diabetes Mother    Kidney failure Mother    Sleep apnea Mother    Colon cancer Father    Heart attack Sister    Arthritis Sister    Heart attack Brother    Gout Brother    Breast cancer Maternal Grandmother    Diabetes Maternal Grandfather    Colon polyps Neg Hx    Esophageal cancer Neg Hx    Rectal cancer Neg Hx    Stomach cancer Neg Hx    Migraines Neg Hx    Seizures Neg Hx    Stroke Neg Hx     SOCIAL HISTORY: Social History   Socioeconomic History   Marital status: Widowed    Spouse name: Not on file   Number of children: 4   Years of education: Not on file   Highest education level: Not on file  Occupational History   Occupation: retired  Tobacco Use   Smoking status: Never   Smokeless tobacco: Never  Vaping Use   Vaping status: Never Used  Substance and Sexual Activity   Alcohol use: Yes    Comment: occasionally   Drug use: Yes    Comment: TCH  gummies   Sexual activity: Not Currently    Comment: pt. given condoms  Other Topics Concern   Not on file  Social History Narrative   1 cup of coffee daily    Social Drivers of Health   Tobacco Use: Low Risk (01/15/2024)   Patient History    Smoking Tobacco Use: Never  Smokeless Tobacco Use: Never    Passive Exposure: Not on file  Financial Resource Strain: Not on file  Food Insecurity: Low Risk (04/24/2023)   Received from [facility]   Epic    Within the past 12 months, you worried that your food would run out before you got money to buy more: Never true    Within the past 12 months, the food you bought just didn't last and you didn't have money to get more. : Never true  Transportation Needs: No Transportation Needs (04/24/2023)   Received from [facility]    In the past 12 months, has lack of reliable transportation kept you from medical appointments, meetings, work or from getting things needed for daily living? : No  Physical Activity: Not on file  Stress: Not on file  Social Connections: Unknown (05/15/2021)   Received from [facility]   Social Network    Social Network: Not on file  Intimate Partner Violence: Not At Risk (05/16/2022)   Humiliation, Afraid, Rape, and Kick questionnaire    Fear of Current or Ex-Partner: No    Emotionally Abused: No    Physically Abused: No    Sexually Abused: No  Depression (PHQ2-9): Low Risk (01/08/2024)   Depression (PHQ2-9)    PHQ-2 Score: 3  Alcohol Screen: Not on file  Housing: Low Risk (04/24/2023)   Received from [facility]   Epic    What is your living situation today?: I have a steady place to live    Think about the place you live. Do you have problems with any of the following? Choose all that apply:: None/None on this list  Utilities: Low Risk (04/24/2023)   Received from [facility]   Utilities    In the past 12 months has the electric, gas, oil, or water company threatened to shut off services  in your home? : No  Health Literacy: Not on file     PHYSICAL EXAM  GENERAL EXAM/CONSTITUTIONAL: Vitals:  Vitals:   01/15/24 0903  BP: 125/77  Pulse: 63  Weight: 253 lb 9.6 oz (115 kg)  Height: 5' 3 (1.6 m)   Body mass index is 44.92 kg/m. Wt Readings from Last 3 Encounters:  01/15/24 253 lb 9.6 oz (115 kg)  01/08/24 252 lb (114.3 kg)  10/22/23 244 lb (110.7 kg)   Patient is in no distress; well developed, nourished and groomed; neck is supple  CARDIOVASCULAR: Examination of carotid arteries is normal; no carotid bruits Regular rate and rhythm, no murmurs Examination of peripheral vascular system by observation and palpation is normal  EYES: Ophthalmoscopic exam of optic discs and posterior segments is normal; no papilledema or hemorrhages No results found.  MUSCULOSKELETAL: Gait, strength, tone, movements noted in Neurologic exam below  NEUROLOGIC: MENTAL STATUS:      No data to display         awake, alert, oriented to person, place and time recent and remote memory intact normal attention and concentration language fluent, comprehension intact, naming intact fund of knowledge appropriate  CRANIAL NERVE:  2nd - no papilledema on fundoscopic exam 2nd, 3rd, 4th, 6th - pupils equal and reactive to light, visual fields full to confrontation, extraocular muscles intact, no nystagmus 5th - facial sensation symmetric 7th - facial strength symmetric 8th - hearing intact 9th - palate elevates symmetrically, uvula midline 11th - shoulder shrug symmetric 12th - tongue protrusion midline  MOTOR:  normal bulk and tone, full strength in the BUE, BLE;  EXCEPT BILATERAL HIP FLEXION 4/5  SENSORY:  normal and symmetric to light touch, temperature, vibration  COORDINATION:  finger-nose-finger, fine finger movements normal  REFLEXES:  deep tendon reflexes 1+ IN BUE; ABSENT IN BLE  GAIT/STATION:  narrow based gait; LIMPING ON RIGHT LEG; RIGHT SI JOINT / RIGHT  GLUTEAL PAIN     DIAGNOSTIC DATA (LABS, IMAGING, TESTING) - I reviewed patient records, labs, notes, testing and imaging myself where available.  Lab Results  Component Value Date   WBC 6.1 01/08/2024   HGB 11.6 (L) 01/08/2024   HCT 37.4 01/08/2024   MCV 89.3 01/08/2024   PLT 279 01/08/2024      Component Value Date/Time   NA 141 01/08/2024 1417   NA 139 10/22/2023 1033   K 4.0 01/08/2024 1417   CL 108 01/08/2024 1417   CO2 25 01/08/2024 1417   GLUCOSE 89 01/08/2024 1417   BUN 17 01/08/2024 1417   BUN 13 10/22/2023 1033   CREATININE 0.97 01/08/2024 1417   CALCIUM  9.8 01/08/2024 1417   PROT 6.8 01/08/2024 1417   PROT 6.9 10/22/2023 1033   ALBUMIN 4.1 10/22/2023 1033   AST 16 01/08/2024 1417   ALT 16 01/08/2024 1417   ALKPHOS 88 10/22/2023 1033   BILITOT 0.4 01/08/2024 1417   BILITOT 0.2 10/22/2023 1033   GFRNONAA 54 (L) 09/05/2023 2005   GFRNONAA 62 03/10/2020 1135   GFRAA 72 03/10/2020 1135   Lab Results  Component Value Date   CHOL 122 01/08/2024   HDL 76 01/08/2024   LDLCALC 30 01/08/2024   LDLDIRECT 43 10/22/2023   TRIG 78 01/08/2024   CHOLHDL 1.6 01/08/2024   Lab Results  Component Value Date   HGBA1C 5.5 03/08/2021   Lab Results  Component Value Date   VITAMINB12 >1500 (H) 10/04/2023   Lab Results  Component Value Date   TSH 3.621 05/19/2009     ASSESSMENT AND PLAN  69 y.o. year old female here with:   Dx:  1. Chronic right SI joint pain   2. Right hip pain       PLAN:  CHRONIC RIGHT HIP / SI JOINT PAIN (since ~2024; history or bilateral knee replacements and right hip replacement) - refer to PT evaluation; then may consider MRI lumbar spine if not improving - fall precautions reviewed; use cane / walker as needed  Orders Placed This Encounter  Procedures   Ambulatory referral to Physical Therapy   Return for pending if symptoms worsen or fail to improve.    EDUARD FABIENE HANLON, MD 01/15/2024, 9:51 AM Certified in  Neurology, Neurophysiology and Neuroimaging  [facility] [address], [address] [place], [place] [address] [phone]     [1] No Known Allergies  "

## 2024-01-15 NOTE — Patient Instructions (Signed)
" °  CHRONIC RIGHT HIP / SI JOINT PAIN (since ~2024; history or bilateral knee replacements and right hip replacement) - refer to PT evaluation; then may consider MRI lumbar spine if not improving - fall precautions reviewed; use cane / walker as needed "

## 2024-01-17 ENCOUNTER — Ambulatory Visit: Admitting: Cardiology

## 2024-01-23 ENCOUNTER — Other Ambulatory Visit (HOSPITAL_BASED_OUTPATIENT_CLINIC_OR_DEPARTMENT_OTHER): Payer: Self-pay

## 2024-01-23 MED ORDER — LINZESS 72 MCG PO CAPS
72.0000 ug | ORAL_CAPSULE | Freq: Every day | ORAL | 4 refills | Status: AC
  Filled 2024-01-23: qty 30, 30d supply, fill #0

## 2024-01-23 MED ORDER — OXYCODONE-ACETAMINOPHEN 10-325 MG PO TABS
1.0000 | ORAL_TABLET | ORAL | 0 refills | Status: AC | PRN
  Filled 2024-01-23: qty 120, 20d supply, fill #0

## 2024-01-23 MED ORDER — ALPRAZOLAM 1 MG PO TABS
1.0000 mg | ORAL_TABLET | Freq: Three times a day (TID) | ORAL | 0 refills | Status: AC
  Filled 2024-01-23: qty 90, 30d supply, fill #0
